# Patient Record
Sex: Male | Born: 1945
Health system: Southern US, Community
[De-identification: ages and names within clinical notes are randomized; demographics above are authoritative.]

## PROBLEM LIST (undated history)

## (undated) DIAGNOSIS — K760 Fatty (change of) liver, not elsewhere classified: Secondary | ICD-10-CM

## (undated) DIAGNOSIS — K2270 Barrett's esophagus without dysplasia: Secondary | ICD-10-CM

## (undated) DIAGNOSIS — R740 Nonspecific elevation of levels of transaminase and lactic acid dehydrogenase [LDH]: Secondary | ICD-10-CM

## (undated) DIAGNOSIS — I10 Essential (primary) hypertension: Secondary | ICD-10-CM

## (undated) DIAGNOSIS — K635 Polyp of colon: Secondary | ICD-10-CM

## (undated) DIAGNOSIS — M5126 Other intervertebral disc displacement, lumbar region: Secondary | ICD-10-CM

## (undated) DIAGNOSIS — K219 Gastro-esophageal reflux disease without esophagitis: Secondary | ICD-10-CM

## (undated) DIAGNOSIS — R7401 Elevation of levels of liver transaminase levels: Secondary | ICD-10-CM

## (undated) HISTORY — PX: SMALL INTESTINE SURGERY: SHX150

## (undated) HISTORY — DX: Elevation of levels of liver transaminase levels: R74.01

## (undated) HISTORY — DX: Gastro-esophageal reflux disease without esophagitis: K21.9

## (undated) HISTORY — DX: Barrett's esophagus without dysplasia: K22.70

## (undated) HISTORY — DX: Essential (primary) hypertension: I10

## (undated) HISTORY — DX: Fatty (change of) liver, not elsewhere classified: K76.0

## (undated) HISTORY — DX: Other intervertebral disc displacement, lumbar region: M51.26

## (undated) HISTORY — PX: TONSILLECTOMY: SUR1361

## (undated) HISTORY — DX: Nonspecific elevation of levels of transaminase and lactic acid dehydrogenase (ldh): R74.0

## (undated) HISTORY — DX: Polyp of colon: K63.5

---

## 2002-02-19 DIAGNOSIS — K2270 Barrett's esophagus without dysplasia: Secondary | ICD-10-CM

## 2002-02-19 DIAGNOSIS — K635 Polyp of colon: Secondary | ICD-10-CM

## 2002-02-19 HISTORY — DX: Barrett's esophagus without dysplasia: K22.70

## 2002-02-19 HISTORY — DX: Polyp of colon: K63.5

## 2005-02-19 DIAGNOSIS — M5126 Other intervertebral disc displacement, lumbar region: Secondary | ICD-10-CM

## 2005-02-19 HISTORY — DX: Other intervertebral disc displacement, lumbar region: M51.26

## 2008-11-08 ENCOUNTER — Ambulatory Visit: Payer: Self-pay | Admitting: Internal Medicine

## 2008-12-13 ENCOUNTER — Ambulatory Visit: Payer: Self-pay | Admitting: Gastroenterology

## 2009-07-04 ENCOUNTER — Ambulatory Visit: Payer: Self-pay | Admitting: Gastroenterology

## 2009-10-04 ENCOUNTER — Other Ambulatory Visit: Payer: Self-pay | Admitting: Gastroenterology

## 2011-01-22 ENCOUNTER — Encounter: Payer: Self-pay | Admitting: Internal Medicine

## 2011-01-22 ENCOUNTER — Ambulatory Visit (INDEPENDENT_AMBULATORY_CARE_PROVIDER_SITE_OTHER): Payer: Medicare Other | Admitting: Internal Medicine

## 2011-01-22 DIAGNOSIS — K219 Gastro-esophageal reflux disease without esophagitis: Secondary | ICD-10-CM

## 2011-01-22 DIAGNOSIS — E669 Obesity, unspecified: Secondary | ICD-10-CM

## 2011-01-22 DIAGNOSIS — E1169 Type 2 diabetes mellitus with other specified complication: Secondary | ICD-10-CM

## 2011-01-22 DIAGNOSIS — E119 Type 2 diabetes mellitus without complications: Secondary | ICD-10-CM

## 2011-01-22 DIAGNOSIS — K760 Fatty (change of) liver, not elsewhere classified: Secondary | ICD-10-CM

## 2011-01-22 DIAGNOSIS — K7689 Other specified diseases of liver: Secondary | ICD-10-CM

## 2011-01-22 DIAGNOSIS — Z23 Encounter for immunization: Secondary | ICD-10-CM

## 2011-01-22 NOTE — Patient Instructions (Signed)
Don't forget to get your annual eye exam soon.  Fasting labs were done today  Your recieved a TdaP vaccine today

## 2011-01-23 ENCOUNTER — Encounter: Payer: Self-pay | Admitting: Internal Medicine

## 2011-01-23 DIAGNOSIS — K219 Gastro-esophageal reflux disease without esophagitis: Secondary | ICD-10-CM | POA: Insufficient documentation

## 2011-01-23 DIAGNOSIS — K760 Fatty (change of) liver, not elsewhere classified: Secondary | ICD-10-CM | POA: Insufficient documentation

## 2011-01-23 DIAGNOSIS — K635 Polyp of colon: Secondary | ICD-10-CM | POA: Insufficient documentation

## 2011-01-23 LAB — COMPREHENSIVE METABOLIC PANEL
ALT: 65 U/L — ABNORMAL HIGH (ref 0–53)
AST: 47 U/L — ABNORMAL HIGH (ref 0–37)
Albumin: 4.3 g/dL (ref 3.5–5.2)
CO2: 20 mEq/L (ref 19–32)
Calcium: 9 mg/dL (ref 8.4–10.5)
Chloride: 106 mEq/L (ref 96–112)
Creatinine, Ser: 1.2 mg/dL (ref 0.4–1.5)
GFR: 63.77 mL/min (ref >60.00)
Potassium: 4.2 mEq/L (ref 3.5–5.1)
Total Protein: 6.9 g/dL (ref 6.0–8.3)

## 2011-01-23 LAB — MICROALBUMIN / CREATININE URINE RATIO: Creatinine,U: 84.4 mg/dL

## 2011-01-23 LAB — LIPID PANEL
LDL Cholesterol: 106 mg/dL — ABNORMAL HIGH (ref 0–99)
Total CHOL/HDL Ratio: 5
Triglycerides: 172 mg/dL — ABNORMAL HIGH (ref 0.0–149.0)

## 2011-01-23 NOTE — Assessment & Plan Note (Signed)
With history of Barrett's esophagus.  Continue PPI indefinitely.  Follow up with Dr. Niel Hummer.

## 2011-01-23 NOTE — Assessment & Plan Note (Signed)
He is due for repeat hepatic panel and lipids.

## 2011-01-23 NOTE — Progress Notes (Signed)
  Subjective:    Patient ID: Chad Gill, male    DOB: 03-05-1945, 65 y.o.   MRN: [id number]  HPI  65 yo male with history of fatty liver by ultrasound, prior carcinoid tumor found on endoscopy by Dr. Niel Hummer, then not confirmed by [facility] referral, presents for general followup on chronic medical issues.  Feels good, no joint pain, nausea, change in bowel habits, or insomnia.  Past Medical History  Diagnosis Date  . Barrett's esophagus 2004    last EGD 2007, no metaplasia, due in 2009  . Transaminitis   . Lumbar disc herniation 2007    L5  . Diabetes mellitus   . GERD (gastroesophageal reflux disease)   . Hyperplastic colonic polyp 2004  . Fatty liver disease, nonalcoholic    No current outpatient prescriptions on file prior to visit.     Review of Systems  Constitutional: Negative for fever, chills, diaphoresis, activity change, appetite change, fatigue and unexpected weight change.  HENT: Negative for hearing loss, ear pain, nosebleeds, congestion, sore throat, facial swelling, rhinorrhea, sneezing, drooling, mouth sores, trouble swallowing, neck pain, neck stiffness, dental problem, voice change, postnasal drip, sinus pressure, tinnitus and ear discharge.   Eyes: Negative for photophobia, pain, discharge, redness, itching and visual disturbance.  Respiratory: Negative for apnea, cough, choking, chest tightness, shortness of breath, wheezing and stridor.   Cardiovascular: Negative for chest pain, palpitations and leg swelling.  Gastrointestinal: Negative for nausea, vomiting, abdominal pain, diarrhea, constipation, blood in stool, abdominal distention, anal bleeding and rectal pain.  Genitourinary: Negative for dysuria, urgency, frequency, hematuria, flank pain, decreased urine volume, scrotal swelling, difficulty urinating and testicular pain.  Musculoskeletal: Negative for myalgias, back pain, joint swelling, arthralgias and gait problem.  Skin: Negative for color change, rash  and wound.  Neurological: Negative for dizziness, tremors, seizures, syncope, speech difficulty, weakness, light-headedness, numbness and headaches.  Psychiatric/Behavioral: Negative for suicidal ideas, hallucinations, behavioral problems, confusion, sleep disturbance, dysphoric mood, decreased concentration and agitation. The patient is not nervous/anxious.        Objective:   Physical Exam  Constitutional: He is oriented to person, place, and time.  HENT:  Head: Normocephalic and atraumatic.  Mouth/Throat: Oropharynx is clear and moist.  Eyes: Conjunctivae and EOM are normal.  Neck: Normal range of motion. Neck supple. No JVD present. No thyromegaly present.  Cardiovascular: Normal rate, regular rhythm and normal heart sounds.   Pulmonary/Chest: Effort normal and breath sounds normal. He has no wheezes. He has no rales.  Abdominal: Soft. Bowel sounds are normal. He exhibits no mass. There is no tenderness. There is no rebound.  Musculoskeletal: Normal range of motion. He exhibits no edema.  Neurological: He is alert and oriented to person, place, and time.  Skin: Skin is warm and dry.  Psychiatric: He has a normal mood and affect.          Assessment & Plan:

## 2011-01-25 MED ORDER — PRAVASTATIN SODIUM 20 MG PO TABS: 20.0000 mg | ORAL_TABLET | Freq: Every evening | ORAL | Status: DC

## 2011-01-25 NOTE — Progress Notes (Signed)
Addended by: Darletta Moll A on: 01/25/2011 02:10 PM   Modules accepted: Orders

## 2011-01-29 NOTE — Progress Notes (Signed)
Addended by: Duncan Dull on: 01/29/2011 05:46 PM   Modules accepted: Orders

## 2011-02-26 ENCOUNTER — Other Ambulatory Visit: Payer: Medicare Other

## 2011-03-14 DIAGNOSIS — E669 Obesity, unspecified: Secondary | ICD-10-CM | POA: Diagnosis not present

## 2011-03-14 DIAGNOSIS — R945 Abnormal results of liver function studies: Secondary | ICD-10-CM | POA: Diagnosis not present

## 2011-03-14 DIAGNOSIS — K7689 Other specified diseases of liver: Secondary | ICD-10-CM | POA: Diagnosis not present

## 2011-03-14 DIAGNOSIS — E785 Hyperlipidemia, unspecified: Secondary | ICD-10-CM | POA: Diagnosis not present

## 2011-03-27 ENCOUNTER — Encounter: Payer: Self-pay | Admitting: Internal Medicine

## 2011-06-18 ENCOUNTER — Telehealth: Payer: Self-pay | Admitting: Internal Medicine

## 2011-06-18 MED ORDER — BENZONATATE 200 MG PO CAPS: 200.0000 mg | ORAL_CAPSULE | Freq: Three times a day (TID) | ORAL | Status: AC | PRN

## 2011-06-18 NOTE — Telephone Encounter (Signed)
.  Tell him to try generic OTC benadryl 25 mg every 8 hours for the drainage,  Sudafed PE  10 to 20 mg every 6 hours for the congestion,  and flushes sinuses twice daily with Simply Saline.   Can call in tessalon 200 mg one tablet every 8 hours prn  Cough  #60 no refills ,  And offer appt for Friday if no better.

## 2011-06-18 NOTE — Telephone Encounter (Signed)
I called patients wife, she stated he has had a productive cough since last Wednesday and he has tried a chest congestion PE tablet OTC and delsym with no relief.  His other only symptom is a sore throat.   She wanted to know if there is anything you can suggest he take or something be called in.  Please advise.

## 2011-06-18 NOTE — Telephone Encounter (Signed)
Patients wife notified, Rx called in.

## 2011-06-18 NOTE — Telephone Encounter (Signed)
Patient has a cough has taken different medication and nothing has help could you recommend something or call something into the pharmacy,

## 2011-06-20 ENCOUNTER — Encounter: Payer: Self-pay | Admitting: Internal Medicine

## 2011-06-20 ENCOUNTER — Ambulatory Visit (INDEPENDENT_AMBULATORY_CARE_PROVIDER_SITE_OTHER): Payer: Medicare Other | Admitting: Internal Medicine

## 2011-06-20 VITALS — BP 134/70 | HR 78 | Temp 98.5°F | Resp 18 | Wt 244.2 lb

## 2011-06-20 DIAGNOSIS — J329 Chronic sinusitis, unspecified: Secondary | ICD-10-CM | POA: Diagnosis not present

## 2011-06-20 MED ORDER — AMOXICILLIN-POT CLAVULANATE 875-125 MG PO TABS: 1.0000 | ORAL_TABLET | Freq: Two times a day (BID) | ORAL | Status: AC

## 2011-06-20 MED ORDER — HYDROCOD POLST-CHLORPHEN POLST 10-8 MG/5ML PO LQCR: 5.0000 mL | Freq: Two times a day (BID) | ORAL | Status: DC | PRN

## 2011-06-20 NOTE — Progress Notes (Signed)
Patient ID: Chad Gill, male   DOB: 1945/05/26, 66 y.o.   MRN: [id number]   Patient Active Problem List  Diagnoses  . GERD (gastroesophageal reflux disease)  . Hyperplastic colonic polyp  . Fatty liver disease, nonalcoholic  . Sinusitis    Subjective:  CC:   Chief Complaint  Patient presents with  . Cough    HPI:   Chad Gill a 66 y.o. male who presents with a 1 week history of sore throat accompanied by bilateral ear pain and nonproductive cough.  He has a persistent sinus congestion and is now lost his voice.  He denies fevers but has had malaise and some mild myalgias. No nausea vomiting or diarrhea. No sick contacts.     Past Medical History  Diagnosis Date  . Barrett's esophagus 2004    last EGD 2007, no metaplasia, due in 2009  . Transaminitis   . Lumbar disc herniation 2007    L5  . Diabetes mellitus   . GERD (gastroesophageal reflux disease)   . Hyperplastic colonic polyp 2004  . Fatty liver disease, nonalcoholic     Past Surgical History  Procedure Date  . Tonsillectomy age 47  . Small intestine surgery     carcinoid tumore, diagnosis disputed         The following portions of the patient's history were reviewed and updated as appropriate: Allergies, current medications, and problem list.    Review of Systems:   12 Pt  review of systems was negative except those addressed in the HPI,     History   Social History  . Marital Status: Married    Spouse Name: N/A    Number of Children: N/A  . Years of Education: N/A   Occupational History  . Not on file.   Social History Main Topics  . Smoking status: Former Smoker    Types: Cigarettes    Quit date: 01/21/1986  . Smokeless tobacco: Never Used  . Alcohol Use: Yes  . Drug Use: No  . Sexually Active: Not on file   Other Topics Concern  . Not on file   Social History Narrative  . No narrative on file    Objective:  BP 134/70  Pulse 78  Temp(Src) 98.5 F (36.9 C)  (Oral)  Resp 18  Wt 244 lb 4 oz (110.791 kg)  SpO2 95%  General appearance: alert, cooperative and appears stated age Ears: normal TM's and external ear canals both ears Throat: lips, mucosa, and tongue normal; teeth and gums normal Neck: no adenopathy, no carotid bruit, supple, symmetrical, trachea midline and thyroid not enlarged, symmetric, no tenderness/mass/nodules Back: symmetric, no curvature. ROM normal. No CVA tenderness. Lungs: clear to auscultation bilaterally Heart: regular rate and rhythm, S1, S2 normal, no murmur, click, rub or gallop Abdomen: soft, non-tender; bowel sounds normal; no masses,  no organomegaly Pulses: 2+ and symmetric Skin: Skin color, texture, turgor normal. No rashes or lesions Lymph nodes: Cervical, supraclavicular, and axillary nodes normal.  Assessment and Plan:  Sinusitis Given chronicity of symptoms, development of facial pain and exam consistent with bacterial URI,  Will treat with empiric antibiotics, decongestants, and saline lavage.     Updated Medication List Outpatient Encounter Prescriptions as of 06/20/2011  Medication Sig Dispense Refill  . benzonatate (TESSALON) 200 MG capsule Take 1 capsule (200 mg total) by mouth every 8 (eight) hours as needed for cough.  60 capsule  0  . nizatidine (AXID) 300 MG capsule Take 300 mg by mouth at  bedtime.        Marland Kitchen omeprazole (PRILOSEC) 20 MG capsule Take 20 mg by mouth 2 (two) times daily.        Marland Kitchen amoxicillin-clavulanate (AUGMENTIN) 875-125 MG per tablet Take 1 tablet by mouth 2 (two) times daily.  14 tablet  0  . chlorpheniramine-HYDROcodone (TUSSIONEX PENNKINETIC ER) 10-8 MG/5ML LQCR Take 5 mLs by mouth every 12 (twelve) hours as needed.  220 mL  0  . DISCONTD: pravastatin (PRAVACHOL) 20 MG tablet Take 1 tablet (20 mg total) by mouth every evening.  90 tablet  2     No orders of the defined types were placed in this encounter.    No Follow-up on file.

## 2011-06-20 NOTE — Patient Instructions (Signed)
You have a sinus/ear infection   .  I am prescribing an antibiotic  to manage the infectin and the inflammation in your ear/sinuses.   I also advise use of the following OTC meds to help with your other symptoms.   Take generic OTC benadryl 25 mg every 8 hours for the drainage,  Sudafed PE  10 to 30 mg every 8 hours for the congestion, you may substitute Afrin nasal spray for the nighttime dose of sudafed PE  If needed to prevent insomnia.  flushes your sinuses twice daily with Simply Saline (do over the sink because if you do it right you will spit out globs of mucus)  Use benzonatate capsules for daytine cough and Tussionex for nighttime cough  Gargle with salt water as needed for sore throat.

## 2011-06-21 ENCOUNTER — Encounter: Payer: Self-pay | Admitting: Internal Medicine

## 2011-06-21 NOTE — Assessment & Plan Note (Signed)
Given chronicity of symptoms, development of facial pain and exam consistent with bacterial URI,  Will treat with empiric antibiotics, decongestants, and saline lavage.   

## 2011-07-18 DIAGNOSIS — E669 Obesity, unspecified: Secondary | ICD-10-CM | POA: Diagnosis not present

## 2011-07-18 DIAGNOSIS — K7689 Other specified diseases of liver: Secondary | ICD-10-CM | POA: Diagnosis not present

## 2011-07-18 DIAGNOSIS — R945 Abnormal results of liver function studies: Secondary | ICD-10-CM | POA: Diagnosis not present

## 2011-07-23 ENCOUNTER — Encounter: Payer: Self-pay | Admitting: Internal Medicine

## 2011-07-23 ENCOUNTER — Ambulatory Visit: Payer: Medicare Other | Admitting: Internal Medicine

## 2011-07-23 VITALS — BP 140/84 | HR 77 | Temp 98.3°F | Resp 18 | Wt 240.2 lb

## 2011-07-23 DIAGNOSIS — E669 Obesity, unspecified: Secondary | ICD-10-CM

## 2011-07-23 DIAGNOSIS — K760 Fatty (change of) liver, not elsewhere classified: Secondary | ICD-10-CM

## 2011-07-23 NOTE — Patient Instructions (Addendum)
Bring your bp cuff with you to next appt with me so we can double check its accuracy.   Good bp is < 140/80  Consider the Low Glycemic Index Diet and 6 smaller meals daily .  This boosts your metabolism and regulates your sugars:   7 AM Low carbohydrate Protein  Shakes (EAS Carb Control  Or Atkins ,  Available everywhere,   In  cases at BJs )  2.5 carbs  (Add or substitute a toasted sandwhich thin w/ peanut butter)  10 AM: Protein bar by Atkins (snack size,  Chocolate lover's variety at  BJ's)    Lunch: sandwich on pita bread or flatbread (Joseph's makes a pita bread and a flat bread , available at [employer] and [employer]J's; Toufayah makes a low carb flatbread available at [employer] and [employer]) Mission makes a low carb whole wheat tortilla available at [employer] most grocery stores   3 PM:  Mid day :  Another protein bar,  Or a  cheese stick, 1/4 cup of almonds, walnuts, pistachios, pecans, peanuts,  Macadamia nuts  6 PM  Dinner:  "mean and green:"  Meat/chicken/fish, salad, and green veggie : use ranch, vinagrette,  Blue cheese, etc  9 PM snack : Breyer's low carb fudgsicle or  ice cream bar (Carb Smart), or  Weight Watcher's ice cream bar , or another protein shake

## 2011-07-23 NOTE — Progress Notes (Signed)
Patient ID: Chad Gill, male   DOB: 07-16-1945, 66 y.o.   MRN: [id number] 6 month follow up on chronic diseases including fatty liver, obesity and hyperlipidemia Recent follow uo wth Carla brady at [facility]  , with repeat ultrasound planned next month.  Feels good.  Not exercising regularly yet  But has vowed to start a video led program with his wife Eber Jones.  Marland Kitchen

## 2011-07-24 DIAGNOSIS — E669 Obesity, unspecified: Secondary | ICD-10-CM | POA: Insufficient documentation

## 2011-07-24 NOTE — Assessment & Plan Note (Signed)
Has been referred for this and other GI isses to Dr. Albin Felling brady at [facility].  Ultrasound and labs to beo done by her office i the next several weeks.  Continue pravastatin for cholestero lowering.

## 2011-07-24 NOTE — Assessment & Plan Note (Signed)
I have addressed  BMI and recommended a low glycemic index diet utilizing smaller more frequent meals to increase metabolism.  I have also recommended that patient start exercising with a goal of 30 minutes of aerobic exercise a minimum of 5 days per week.  

## 2011-08-06 ENCOUNTER — Other Ambulatory Visit: Payer: Self-pay | Admitting: Internal Medicine

## 2011-08-06 MED ORDER — NIZATIDINE 300 MG PO CAPS: 300.0000 mg | ORAL_CAPSULE | Freq: Every day | ORAL | Status: DC

## 2011-08-15 DIAGNOSIS — K7689 Other specified diseases of liver: Secondary | ICD-10-CM | POA: Diagnosis not present

## 2011-11-02 ENCOUNTER — Other Ambulatory Visit: Payer: Self-pay | Admitting: Internal Medicine

## 2011-11-02 ENCOUNTER — Other Ambulatory Visit: Payer: Self-pay | Admitting: *Deleted

## 2011-11-02 DIAGNOSIS — E785 Hyperlipidemia, unspecified: Secondary | ICD-10-CM

## 2011-11-02 MED ORDER — OMEPRAZOLE 20 MG PO CPDR: 20.0000 mg | DELAYED_RELEASE_CAPSULE | Freq: Two times a day (BID) | ORAL | Status: DC

## 2011-11-02 NOTE — Telephone Encounter (Signed)
Received e-script request for Pravastatin, this is not on med list.  Is patient suppose to be on this medication?  Please advise.

## 2011-11-02 NOTE — Telephone Encounter (Signed)
Yes it was called in in December by staff but the chart was not updated. I will update chart now.

## 2011-11-21 DIAGNOSIS — K7689 Other specified diseases of liver: Secondary | ICD-10-CM | POA: Diagnosis not present

## 2012-01-18 ENCOUNTER — Other Ambulatory Visit: Payer: Self-pay | Admitting: Internal Medicine

## 2012-01-30 ENCOUNTER — Ambulatory Visit (INDEPENDENT_AMBULATORY_CARE_PROVIDER_SITE_OTHER): Payer: Medicare Other | Admitting: Internal Medicine

## 2012-01-30 ENCOUNTER — Encounter: Payer: Self-pay | Admitting: Internal Medicine

## 2012-01-30 VITALS — BP 160/90 | HR 74 | Temp 98.3°F | Resp 12 | Ht 69.0 in | Wt 239.8 lb

## 2012-01-30 DIAGNOSIS — Z23 Encounter for immunization: Secondary | ICD-10-CM

## 2012-01-30 DIAGNOSIS — Z1331 Encounter for screening for depression: Secondary | ICD-10-CM | POA: Diagnosis not present

## 2012-01-30 DIAGNOSIS — K760 Fatty (change of) liver, not elsewhere classified: Secondary | ICD-10-CM

## 2012-01-30 DIAGNOSIS — E669 Obesity, unspecified: Secondary | ICD-10-CM

## 2012-01-30 DIAGNOSIS — I10 Essential (primary) hypertension: Secondary | ICD-10-CM | POA: Insufficient documentation

## 2012-01-30 DIAGNOSIS — E119 Type 2 diabetes mellitus without complications: Secondary | ICD-10-CM

## 2012-01-30 DIAGNOSIS — K7689 Other specified diseases of liver: Secondary | ICD-10-CM

## 2012-01-30 LAB — MICROALBUMIN / CREATININE URINE RATIO
Creatinine,U: 200.5 mg/dL
Microalb Creat Ratio: 0.2 mg/g (ref 0.0–30.0)
Microalb, Ur: 0.4 mg/dL (ref 0.0–1.9)

## 2012-01-30 LAB — LIPID PANEL: Total CHOL/HDL Ratio: 5

## 2012-01-30 LAB — HEMOGLOBIN A1C: Hgb A1c MFr Bld: 5.5 % (ref 4.6–6.5)

## 2012-01-30 MED ORDER — MELOXICAM 15 MG PO TABS: 15.0000 mg | ORAL_TABLET | Freq: Every day | ORAL | Status: DC

## 2012-01-30 NOTE — Progress Notes (Addendum)
Patient ID: Chad Gill, male   DOB: November 11, 1945, 66 y.o.   MRN: [id number]  Patient Active Problem List  Diagnosis  . GERD (gastroesophageal reflux disease)  . Hyperplastic colonic polyp  . Fatty liver disease, nonalcoholic  . Sinusitis  . Obesity (BMI 30-39.9)  . Hypertension    Subjective:  CC:   Chief Complaint  Patient presents with  . Follow-up    HPI:   Chad Gill a 66 y.o. male who presents for six-month Follow up on fatty liver, obesity, and hypertension. He is not exercising vigorously but is walking daily  and plans to increase his physical activity after the holidays.  Has lost 5 lbs thus far.   2) Left shoulder pain,  Chronic aggravated by abduction, with no prior trauma.2) elevated bp.  Repeat bp by me 160/90  Home machine measured it at  147/98. Same arm (right)   Past Medical History  Diagnosis Date  . Barrett's esophagus 2004    last EGD 2007, no metaplasia, due in 2009  . Transaminitis   . Lumbar disc herniation 2007    L5  . Diabetes mellitus   . GERD (gastroesophageal reflux disease)   . Hyperplastic colonic polyp 2004  . Fatty liver disease, nonalcoholic     Past Surgical History  Procedure Date  . Tonsillectomy age 48  . Small intestine surgery     carcinoid tumore, diagnosis disputed         The following portions of the patient's history were reviewed and updated as appropriate: Allergies, current medications, and problem list.    Review of Systems:   12 Pt  review of systems was negative except those addressed in the HPI,     History   Social History  . Marital Status: Married    Spouse Name: N/A    Number of Children: N/A  . Years of Education: N/A   Occupational History  . Not on file.   Social History Main Topics  . Smoking status: Former Smoker    Types: Cigarettes    Quit date: 01/21/1986  . Smokeless tobacco: Never Used  . Alcohol Use: Yes  . Drug Use: No  . Sexually Active: Not on file   Other  Topics Concern  . Not on file   Social History Narrative  . No narrative on file    Objective:  BP 160/90  Pulse 74  Temp 98.3 F (36.8 C) (Oral)  Resp 12  Ht 5\' 9"  (1.753 m)  Wt 239 lb 12 oz (108.75 kg)  BMI 35.40 kg/m2  SpO2 96%  General appearance: alert, cooperative and appears stated age Ears: normal TM's and external ear canals both ears Throat: lips, mucosa, and tongue normal; teeth and gums normal Neck: no adenopathy, no carotid bruit, supple, symmetrical, trachea midline and thyroid not enlarged, symmetric, no tenderness/mass/nodules Back: symmetric, no curvature. ROM normal. No CVA tenderness. Lungs: clear to auscultation bilaterally Heart: regular rate and rhythm, S1, S2 normal, no murmur, click, rub or gallop Abdomen: soft, non-tender; bowel sounds normal; no masses,  no organomegaly Pulses: 2+ and symmetric Skin: Skin color, texture, turgor normal. No rashes or lesions Lymph nodes: Cervical, supraclavicular, and axillary nodes normal.  Assessment and Plan:  Fatty liver disease, nonalcoholic Diagnosed and followed by Dr.Diehk at [facility]. Liver enzymes have been slightly elevated but stable. He is controlling his his cholesterol with pravastatin fish oil and vitamin E. He has been addressing his obesity with a 5 pound weight loss thus far.  Encouragement provided.  Hypertension New diagnosis .  Review of past vital signs of showed 140/82 been his norm. Microalbumin to creatinine ratio to was checked today along with an A1c. Both of  these were normal. He has been asked to check his blood pressure pressure several times over the next several weeks and report numbers to me along with pulse so that I can decide what medication to start treating them with  Obesity (BMI 30-39.9) I have addressed  BMI and recommended a low glycemic index diet utilizing smaller more frequent meals to increase metabolism.  I have also recommended that patient start exercising with a  goal of 30 minutes of aerobic exercise a minimum of 5 days per week.   Updated Medication List Outpatient Encounter Prescriptions as of 01/30/2012  Medication Sig Dispense Refill  . fish oil-omega-3 fatty acids 1000 MG capsule Take 2 g by mouth daily.      . Garlic 1000 MG CAPS Take by mouth.      . nizatidine (AXID) 300 MG capsule Take 1 capsule (300 mg total) by mouth at bedtime.  90 capsule  3  . omeprazole (PRILOSEC) 20 MG capsule TAKE ONE CAPSULE BY MOUTH TWICE A DAY  90 capsule  0  . vitamin E 400 UNIT capsule Take 800 Units by mouth daily.      . meloxicam (MOBIC) 15 MG tablet Take 1 tablet (15 mg total) by mouth daily.  30 tablet  2  . pravastatin (PRAVACHOL) 20 MG tablet TAKE 1 TABLET BY MOUTH EVERY EVENING  90 tablet  2     Orders Placed This Encounter  Procedures  . Pneumococcal polysaccharide vaccine 23-valent greater than or equal to 2yo subcutaneous/IM  . Lipid panel  . Microalbumin / creatinine urine ratio  . Hemoglobin A1c    No Follow-up on file.

## 2012-01-30 NOTE — Patient Instructions (Addendum)
Try once daily meloxicam ,   For your shoulder.  Ok to combine with occasional tylenol (max 2  Tylenol daily) but do not combine with advil.,  Alleve,  Motrin etc.  Please check bp a few times over the next 3 weeks.  Remember that your machine is about 10 pts lower than mine on the sysolic end ,  And 10 pt shigher on the diastolic end .      Record the raw numbers, not the adjusted normals and submit in a few weeks.  You received the pneumonia vaccine today    This is  my version of a  "Low GI"  Diet:  All of the foods can be found at grocery stores and in bulk at [employer].  The Atkins protein bars and shakes are available in more varieties at Target, WalMart and [employer]'s Foods.     7 AM Breakfast:  Low carbohydrate Protein  Shakes (I recommend the EAS AdvantEdge "Carb Control" shakes  Or the low carb shakes by Atkins.   Both are available everywhere:  In  cases at BJs  Or in 4 packs at grocery stores and pharmacies  2.5 carbs  (Alternative is  a toasted Arnold's Sandwhich Thin w/ peanut butter, a "Bagel Thin" with cream cheese and salmon) or  a scrambled egg burrito made with a low carb tortilla .  Avoid cereal and bananas, oatmeal too unless you are cooking the old fashioned kind that takes 30-40 minutes to prepare.  the rest is overly processed, has minimal fiber, and is loaded with carbohydrates!   10 AM: Protein bar by Atkins (the snack size, under 200 cal).  There are many varieties , available widely again or in bulk in limited varieties at BJs)  Other so called "protein bars" tend to be loaded with carbohydrates.  Remember, in food advertising, the word "energy" is synonymous for " carbohydrate."  Lunch: sandwich of [place], (or any lunchmeat, grilled meat or canned tuna), fresh avocado, mayonnaise  and cheese on a lower carbohydrate pita bread, flatbread, or tortilla . Ok to use regular mayonnaise. The bread is the only source or carbohydrate that can be decreased (Joseph's makes a pita bread  and a flat bread that are 50 cal and 4 net carbs ; Toufayan makes a low carb flatbread that's 100 cal and 9 net carbs  and  Mission makes a low carb whole wheat tortilla  That is 210 cal and 6 net carbs)  3 PM:  Mid day :  Another protein bar,  Or a  cheese stick (100 cal, 0 carbs),  Or 1 ounce of  almonds, walnuts, pistachios, pecans, peanuts,  Macadamia nuts. Or a Dannon light n Fit greek yogurt, 80 cal 8 net carbs . Avoid "granola"; the dried cranberries and raisins are loaded with carbohydrates. Mixed nuts ok if no raisins or cranberries or dried fruit.   6 PM  Dinner:  "mean and green:"  Meat/chicken/fish or a high protein legume; , with a green salad, and a low GI  Veggie (broccoli, cauliflower, green beans, spinach, brussel sprouts. Lima beans) : Avoid "Low fat dressings, as well as Reyne Dumas and [address]! They are loaded with sugar! Instead use ranch, vinagrette,  Blue cheese, etc.  There is a low carb pasta by Dreamfield's available at [facility] that is acceptable and tastes great. Try Micheal Angelo's chicken piccata,  Eggplant and chicken parmigiano that are all low carb.   The chicken dish is 0 carbs,  and can be found in frozen section at BJs and Lowe's. Also try [employer] "Carnitas" (pulled pork, no sauce,  0 carbs) and his pot roast.   both are in the refrigerated section at BJs   9 PM snack : Breyer's "low carb" fudgsicle or  ice cream bar (Carb Smart line), or  Weight Watcher's ice cream bar , or another "no sugar added" ice cream;a serving of fresh berries/cherries with whipped cream (Avoid bananas, pineapple, grapes  and watermelon on a regular basis because they are high in sugar)   Remember that snack Substitutions should be less than 15 to 20 carbs  Per serving. Remember to subtract fiber grams and sugar alcohols to get the "net carbs."

## 2012-02-02 ENCOUNTER — Encounter: Payer: Self-pay | Admitting: Internal Medicine

## 2012-02-02 NOTE — Assessment & Plan Note (Signed)
I have addressed  BMI and recommended a low glycemic index diet utilizing smaller more frequent meals to increase metabolism.  I have also recommended that patient start exercising with a goal of 30 minutes of aerobic exercise a minimum of 5 days per week.  

## 2012-02-02 NOTE — Assessment & Plan Note (Signed)
Diagnosed and followed by Dr.Diehk at [facility]. Liver enzymes have been slightly elevated but stable. He is controlling his his cholesterol with pravastatin fish oil and vitamin E. He has been addressing his obesity with a 5 pound weight loss thus far. Encouragement provided.

## 2012-02-02 NOTE — Assessment & Plan Note (Addendum)
New diagnosis .  Review of past vital signs of showed 140/82 been his norm. Microalbumin to creatinine ratio to was checked today along with an A1c. Both of  these were normal. He has been asked to check his blood pressure pressure several times over the next several weeks and report numbers to me along with pulse so that I can decide what medication to start treating them with

## 2012-04-12 ENCOUNTER — Other Ambulatory Visit: Payer: Self-pay | Admitting: Internal Medicine

## 2012-05-21 DIAGNOSIS — K7689 Other specified diseases of liver: Secondary | ICD-10-CM | POA: Diagnosis not present

## 2012-05-21 DIAGNOSIS — E669 Obesity, unspecified: Secondary | ICD-10-CM | POA: Diagnosis not present

## 2012-05-28 ENCOUNTER — Ambulatory Visit: Payer: Medicare Other | Admitting: Internal Medicine

## 2012-06-03 LAB — HM DIABETES EYE EXAM

## 2012-06-16 ENCOUNTER — Ambulatory Visit (INDEPENDENT_AMBULATORY_CARE_PROVIDER_SITE_OTHER)
Admission: RE | Admit: 2012-06-16 | Discharge: 2012-06-16 | Disposition: A | Discharge status: Home / Self Care | Payer: Medicare Other | Source: Ambulatory Visit | Attending: Internal Medicine | Admitting: Internal Medicine

## 2012-06-16 ENCOUNTER — Encounter: Payer: Self-pay | Admitting: Internal Medicine

## 2012-06-16 ENCOUNTER — Ambulatory Visit (INDEPENDENT_AMBULATORY_CARE_PROVIDER_SITE_OTHER): Payer: Medicare Other | Admitting: Internal Medicine

## 2012-06-16 VITALS — BP 130/78 | HR 82 | Temp 98.4°F | Ht 69.0 in | Wt 241.5 lb

## 2012-06-16 DIAGNOSIS — R062 Wheezing: Secondary | ICD-10-CM | POA: Diagnosis not present

## 2012-06-16 DIAGNOSIS — J449 Chronic obstructive pulmonary disease, unspecified: Secondary | ICD-10-CM | POA: Diagnosis not present

## 2012-06-16 DIAGNOSIS — I10 Essential (primary) hypertension: Secondary | ICD-10-CM | POA: Diagnosis not present

## 2012-06-16 DIAGNOSIS — R05 Cough: Secondary | ICD-10-CM

## 2012-06-16 MED ORDER — CEFDINIR 300 MG PO CAPS: 300.0000 mg | ORAL_CAPSULE | Freq: Two times a day (BID) | ORAL | Status: DC

## 2012-06-16 MED ORDER — ALBUTEROL SULFATE HFA 108 (90 BASE) MCG/ACT IN AERS: 2.0000 | INHALATION_SPRAY | Freq: Four times a day (QID) | RESPIRATORY_TRACT | Status: DC | PRN

## 2012-06-16 NOTE — Patient Instructions (Signed)
I am going to give you an antibiotic (omnicef) - take one capsule 2x/day.  Mucinex DM in the am and Robitussin DM in the evening.   Use the albuterol inhaler - 2 puffs 4x/day as needed.

## 2012-06-17 ENCOUNTER — Encounter: Payer: Self-pay | Admitting: Internal Medicine

## 2012-06-17 NOTE — Progress Notes (Signed)
  Subjective:    Patient ID: Chad Gill, male    DOB: [birth year], 67 y.o.   MRN: [id number]  HPI 67 year old male with past history of hypertension and GERD who comes in today with head congestion and increased cough.  States symptoms started last week.  No increased drainage.  Does report sore throat from the increased coughing.  No fever.  Increased chest congestion.  Increased cough.  No sob.  No chest tightness.  No nausea or vomiting.  No bowel change.  Previous tobacco use.  Quit 1987.     Past Medical History  Diagnosis Date  . Barrett's esophagus 2004    last EGD 2007, no metaplasia, due in 2009  . Transaminitis   . Lumbar disc herniation 2007    L5  . Diabetes mellitus   . GERD (gastroesophageal reflux disease)   . Hyperplastic colonic polyp 2004  . Fatty liver disease, nonalcoholic     Current Outpatient Prescriptions on File Prior to Visit  Medication Sig Dispense Refill  . fish oil-omega-3 fatty acids 1000 MG capsule Take 2 g by mouth daily.      . Garlic 1000 MG CAPS Take by mouth.      . nizatidine (AXID) 300 MG capsule Take 1 capsule (300 mg total) by mouth at bedtime.  90 capsule  3  . omeprazole (PRILOSEC) 20 MG capsule TAKE ONE CAPSULE BY MOUTH TWICE A DAY  90 capsule  3  . vitamin E 400 UNIT capsule Take 800 Units by mouth daily.      . meloxicam (MOBIC) 15 MG tablet Take 1 tablet (15 mg total) by mouth daily.  30 tablet  2  . pravastatin (PRAVACHOL) 20 MG tablet TAKE 1 TABLET BY MOUTH EVERY EVENING  90 tablet  2   No current facility-administered medications on file prior to visit.    Review of Systems Patient denies any headache, lightheadedness or dizziness. Does report head congestin.  No increased drainage.   Sore throat from coughing.  No chest pain, tightness or palpitations.  No increased shortness of breath.  Does report increased cough and chest congestion.  No nausea or vomiting.  No bowel change.        Objective:   Physical Exam Filed Vitals:    06/16/12 1420  BP: 130/78  Pulse: 82  Temp: 98.4 F (60.18 C)   67 year old male in no acute distress.   HEENT:  Nares- clear.  Oropharynx - without lesions.  No significant tenderness to palpation over the sinuses.  NECK:  Supple.  Nontender.   HEART:  Appears to be regular. LUNGS:  No crackles or wheezing audible.  Some increased congestion - left lung base.  Cleared some with coughing.          Assessment & Plan:  URI.  With increased chest congestion and cough.  Exam as outlined.  Given some increased congestion at the lung base - will check cxr.  Start omnicef 300mg  bid x 10 days.  Albuterol inhaler as directed.  Mucinex DM in the am and robitussin DM in the pm.  Rest.  Fluids. Explained to him if symptoms changed, worsened or do not resolve - he is to be reevaluated.

## 2012-06-17 NOTE — Assessment & Plan Note (Signed)
Blood pressure controlled. 

## 2012-06-19 ENCOUNTER — Other Ambulatory Visit: Payer: Self-pay | Admitting: Internal Medicine

## 2012-06-19 MED ORDER — HYDROCOD POLST-CHLORPHEN POLST 10-8 MG/5ML PO LQCR: 5.0000 mL | Freq: Two times a day (BID) | ORAL | Status: DC | PRN

## 2012-07-17 DIAGNOSIS — H2510 Age-related nuclear cataract, unspecified eye: Secondary | ICD-10-CM | POA: Diagnosis not present

## 2012-07-28 ENCOUNTER — Other Ambulatory Visit: Payer: Self-pay | Admitting: Internal Medicine

## 2012-08-25 ENCOUNTER — Ambulatory Visit (INDEPENDENT_AMBULATORY_CARE_PROVIDER_SITE_OTHER): Payer: Medicare Other | Admitting: Internal Medicine

## 2012-08-25 ENCOUNTER — Encounter: Payer: Self-pay | Admitting: Internal Medicine

## 2012-08-25 VITALS — BP 158/90 | HR 72 | Temp 98.4°F | Resp 14 | Wt 242.5 lb

## 2012-08-25 DIAGNOSIS — M25519 Pain in unspecified shoulder: Secondary | ICD-10-CM

## 2012-08-25 DIAGNOSIS — I10 Essential (primary) hypertension: Secondary | ICD-10-CM | POA: Diagnosis not present

## 2012-08-25 DIAGNOSIS — E669 Obesity, unspecified: Secondary | ICD-10-CM

## 2012-08-25 DIAGNOSIS — K219 Gastro-esophageal reflux disease without esophagitis: Secondary | ICD-10-CM

## 2012-08-25 DIAGNOSIS — E785 Hyperlipidemia, unspecified: Secondary | ICD-10-CM | POA: Diagnosis not present

## 2012-08-25 DIAGNOSIS — Z79899 Other long term (current) drug therapy: Secondary | ICD-10-CM | POA: Diagnosis not present

## 2012-08-25 DIAGNOSIS — M25512 Pain in left shoulder: Secondary | ICD-10-CM

## 2012-08-25 DIAGNOSIS — R739 Hyperglycemia, unspecified: Secondary | ICD-10-CM

## 2012-08-25 DIAGNOSIS — R7309 Other abnormal glucose: Secondary | ICD-10-CM

## 2012-08-25 LAB — COMPREHENSIVE METABOLIC PANEL
CO2: 28 mEq/L (ref 19–32)
Creatinine, Ser: 1.2 mg/dL (ref 0.4–1.5)
GFR: 64.69 mL/min (ref >60.00)
Glucose, Bld: 128 mg/dL — ABNORMAL HIGH (ref 70–99)
Total Bilirubin: 1.1 mg/dL (ref 0.3–1.2)

## 2012-08-25 LAB — LIPID PANEL
Cholesterol: 191 mg/dL (ref 0–200)
HDL: 37.2 mg/dL — ABNORMAL LOW (ref >39.00)
VLDL: 38.6 mg/dL (ref 0.0–40.0)

## 2012-08-25 MED ORDER — HYDROCHLOROTHIAZIDE 25 MG PO TABS: 25.0000 mg | ORAL_TABLET | Freq: Every day | ORAL | Status: DC

## 2012-08-25 NOTE — Progress Notes (Signed)
Patient ID: Chad Gill, male   DOB: 1945/08/16, 67 y.o.   MRN: [id number]  Patient Active Problem List   Diagnosis Date Noted  . Pain in joint, shoulder region 08/26/2012  . Hyperglycemia 08/26/2012  . Hypertension 01/30/2012  . Obesity (BMI 30-39.9) 07/24/2011  . GERD (gastroesophageal reflux disease)   . Hyperplastic colonic polyp   . Fatty liver disease, nonalcoholic     Subjective:  CC:   Chief Complaint  Patient presents with  . Follow-up    3 month    HPI:    Chad Gill a 67 y.o. male who presents for 6 month follow up on chronic conditions including new diagnosis of hypertension,  Fatty liver disease,  And obesity. He feels generally well, but has stopped taking meloxicam due to concern that it might cause a heart attack.  .  He has not been exercising regularly due to shoulder pain and family event.  He is not restricting his diet  He has not lost any signigicant amount of weight but is down 2 lbs compared to May 2013.    Past Medical History  Diagnosis Date  . Barrett's esophagus 2004    last EGD 2007, no metaplasia, due in 2009  . Transaminitis   . Lumbar disc herniation 2007    L5  . Diabetes mellitus   . GERD (gastroesophageal reflux disease)   . Hyperplastic colonic polyp 2004  . Fatty liver disease, nonalcoholic     Past Surgical History  Procedure Laterality Date  . Tonsillectomy  age 5  . Small intestine surgery      carcinoid tumore, diagnosis disputed       The following portions of the patient's history were reviewed and updated as appropriate: Allergies, current medications, and problem list.    Review of Systems:   12 Pt  review of systems was negative except those addressed in the HPI,     History   Social History  . Marital Status: Married    Spouse Name: N/A    Number of Children: N/A  . Years of Education: N/A   Occupational History  . Not on file.   Social History Main Topics  . Smoking status: Former  Smoker    Types: Cigarettes    Quit date: 01/21/1986  . Smokeless tobacco: Never Used  . Alcohol Use: Yes     Comment: ocassional beer  . Drug Use: No  . Sexually Active: Not on file   Other Topics Concern  . Not on file   Social History Narrative  . No narrative on file    Objective:  BP 158/90  Pulse 72  Temp(Src) 98.4 F (36.9 C) (Oral)  Resp 14  Wt 242 lb 8 oz (109.997 kg)  BMI 35.79 kg/m2  SpO2 98%  General appearance: alert, cooperative and appears stated age Ears: normal TM's and external ear canals both ears Throat: lips, mucosa, and tongue normal; teeth and gums normal Neck: no adenopathy, no carotid bruit, supple, symmetrical, trachea midline and thyroid not enlarged, symmetric, no tenderness/mass/nodules Back: symmetric, no curvature. ROM normal. No CVA tenderness. Lungs: clear to auscultation bilaterally Heart: regular rate and rhythm, S1, S2 normal, no murmur, click, rub or gallop Abdomen: soft, non-tender; bowel sounds normal; no masses,  no organomegaly Pulses: 2+ and symmetric Skin: Skin color, texture, turgor normal. No rashes or lesions Lymph nodes: Cervical, supraclavicular, and axillary nodes normal. Ext: right shoulder has full ROM but pain eeicited with full abduction and externsion  Assessment and Plan:  Hypertension Home blood pressures confirm elevations.   Trial of HCTZ 25 mg daily,  For goal bp of 130/70 using  Home machine.  baseline BMET today  GERD (gastroesophageal reflux disease) With history of Barrett's esophagus.  Continue PPI indefinitely.     Obesity (BMI 30-39.9) Her BMI remains unchanged due to lack of regular exercise and non adherence to low GI.  Fredia Beets advised him to resume the low GI diet using six smaller meals a day to stimulate his metabolism and begin a regular exercise program wit hgoal of 5 days per week , 30 mintues   Pain in joint, shoulder region With no history of trauma.  Resume meloxicam.  If no improvement  will refer to orthopedist for subacromial bursitis  Hyperglycemia Fasting glucose today was 128, which is diagnostic of diabetes melliuts,  Prior A1cs checked were in nromal range at 5.5 last yar. .  hgba1c ordered.    Updated Medication List Outpatient Encounter Prescriptions as of 08/25/2012  Medication Sig Dispense Refill  . nizatidine (AXID) 300 MG capsule TAKE 1 CAPSULE BY MOUTH AT BEDTIME.  90 capsule  1  . omeprazole (PRILOSEC) 20 MG capsule TAKE ONE CAPSULE BY MOUTH TWICE A DAY  90 capsule  3  . albuterol (PROVENTIL HFA;VENTOLIN HFA) 108 (90 BASE) MCG/ACT inhaler Inhale 2 puffs into the lungs every 6 (six) hours as needed for wheezing.  1 Inhaler  0  . chlorpheniramine-HYDROcodone (TUSSIONEX PENNKINETIC ER) 10-8 MG/5ML LQCR Take 5 mLs by mouth every 12 (twelve) hours as needed.  280 mL  0  . fish oil-omega-3 fatty acids 1000 MG capsule Take 2 g by mouth daily.      . Garlic 1000 MG CAPS Take by mouth.      . hydrochlorothiazide (HYDRODIURIL) 25 MG tablet Take 1 tablet (25 mg total) by mouth daily.  90 tablet  3  . meloxicam (MOBIC) 15 MG tablet Take 1 tablet (15 mg total) by mouth daily.  30 tablet  2  . pravastatin (PRAVACHOL) 20 MG tablet TAKE 1 TABLET BY MOUTH EVERY EVENING  90 tablet  2  . vitamin E 400 UNIT capsule Take 800 Units by mouth daily.      . [DISCONTINUED] cefdinir (OMNICEF) 300 MG capsule Take 1 capsule (300 mg total) by mouth 2 (two) times daily.  20 capsule  0   No facility-administered encounter medications on file as of 08/25/2012.     Orders Placed This Encounter  Procedures  . Comprehensive metabolic panel  . Lipid panel    No Follow-up on file.

## 2012-08-25 NOTE — Patient Instructions (Addendum)
I am starting you on HCTZ,  A mild diuretic,  For your hypertension.  Take it once daily in the morning,.  It may increase your urination for a few weeks.  Suspend it if you get a diarrheal or GI illness, until you are back to normal.  You may continue to use the meloxicam as needed for shoulder pain and can add tylenol up to 1000 mg daily for pain control  We can refer you to an orthopedist for your left shoulder pain for a steroid shot if the pain does not improve in a few weeks   Our goal is to get your BP down to < 140/80 (130/70 by your meter)   Return in one month if not at goal (6 months if your bp is better)

## 2012-08-26 ENCOUNTER — Encounter: Payer: Self-pay | Admitting: Internal Medicine

## 2012-08-26 DIAGNOSIS — E669 Obesity, unspecified: Secondary | ICD-10-CM | POA: Insufficient documentation

## 2012-08-26 DIAGNOSIS — E119 Type 2 diabetes mellitus without complications: Secondary | ICD-10-CM | POA: Insufficient documentation

## 2012-08-26 DIAGNOSIS — M25519 Pain in unspecified shoulder: Secondary | ICD-10-CM | POA: Insufficient documentation

## 2012-08-26 NOTE — Assessment & Plan Note (Signed)
Fasting glucose today was 128.  hgba1c ordered.

## 2012-08-26 NOTE — Assessment & Plan Note (Signed)
Her BMI remains unchanged due to lack of regular exercise and non adherence to low GI.  Fredia Beets advised him to resume the low GI diet using six smaller meals a day to stimulate his metabolism and begin a regular exercise program wit hgoal of 5 days per week , 30 mintues

## 2012-08-26 NOTE — Assessment & Plan Note (Signed)
Home blood pressures confirm elevations.   Trial of HCTZ 25 mg daily,  For goal bp of 130/70 using  Home machine.  baseline BMET today   

## 2012-08-26 NOTE — Assessment & Plan Note (Signed)
With no history of trauma.  Resume meloxicam.  If no improvement will refer to orthopedist for subacromial bursitis

## 2012-08-26 NOTE — Assessment & Plan Note (Signed)
With history of Barrett's esophagus.  Continue PPI indefinitely.      

## 2012-08-31 NOTE — Addendum Note (Signed)
Addended by: Sherlene Shams on: 08/31/2012 08:44 PM   Modules accepted: Orders

## 2012-10-14 ENCOUNTER — Other Ambulatory Visit: Payer: Self-pay | Admitting: Internal Medicine

## 2012-11-19 DIAGNOSIS — E669 Obesity, unspecified: Secondary | ICD-10-CM | POA: Diagnosis not present

## 2012-11-19 DIAGNOSIS — K7689 Other specified diseases of liver: Secondary | ICD-10-CM | POA: Diagnosis not present

## 2012-11-19 DIAGNOSIS — I10 Essential (primary) hypertension: Secondary | ICD-10-CM | POA: Diagnosis not present

## 2013-01-21 ENCOUNTER — Other Ambulatory Visit: Payer: Self-pay | Admitting: Internal Medicine

## 2013-03-02 ENCOUNTER — Encounter: Payer: Self-pay | Admitting: Internal Medicine

## 2013-03-02 ENCOUNTER — Ambulatory Visit (INDEPENDENT_AMBULATORY_CARE_PROVIDER_SITE_OTHER): Payer: Medicare Other | Admitting: Internal Medicine

## 2013-03-02 VITALS — BP 156/86 | HR 89 | Temp 98.4°F | Resp 16 | Wt 246.2 lb

## 2013-03-02 DIAGNOSIS — IMO0001 Reserved for inherently not codable concepts without codable children: Secondary | ICD-10-CM

## 2013-03-02 DIAGNOSIS — E669 Obesity, unspecified: Secondary | ICD-10-CM

## 2013-03-02 DIAGNOSIS — I10 Essential (primary) hypertension: Secondary | ICD-10-CM

## 2013-03-02 DIAGNOSIS — R7309 Other abnormal glucose: Secondary | ICD-10-CM

## 2013-03-02 DIAGNOSIS — E1165 Type 2 diabetes mellitus with hyperglycemia: Secondary | ICD-10-CM

## 2013-03-02 DIAGNOSIS — K760 Fatty (change of) liver, not elsewhere classified: Secondary | ICD-10-CM

## 2013-03-02 DIAGNOSIS — K7689 Other specified diseases of liver: Secondary | ICD-10-CM | POA: Diagnosis not present

## 2013-03-02 LAB — COMPREHENSIVE METABOLIC PANEL
ALT: 91 U/L — ABNORMAL HIGH (ref 0–53)
AST: 57 U/L — AB (ref 0–37)
Albumin: 4.1 g/dL (ref 3.5–5.2)
Alkaline Phosphatase: 52 U/L (ref 39–117)
BUN: 12 mg/dL (ref 6–23)
CALCIUM: 9.2 mg/dL (ref 8.4–10.5)
CHLORIDE: 97 meq/L (ref 96–112)
CO2: 30 meq/L (ref 19–32)
CREATININE: 1.2 mg/dL (ref 0.4–1.5)
GFR: 64.59 mL/min (ref >60.00)
Glucose, Bld: 151 mg/dL — ABNORMAL HIGH (ref 70–99)
Potassium: 4.3 mEq/L (ref 3.5–5.1)
Sodium: 138 mEq/L (ref 135–145)
Total Bilirubin: 1.2 mg/dL (ref 0.3–1.2)
Total Protein: 7 g/dL (ref 6.0–8.3)

## 2013-03-02 LAB — HEMOGLOBIN A1C: HEMOGLOBIN A1C: 6.3 % (ref 4.6–6.5)

## 2013-03-02 NOTE — Progress Notes (Signed)
Pre-visit discussion using our clinic review tool. No additional management support is needed unless otherwise documented below in the visit note.  

## 2013-03-02 NOTE — Patient Instructions (Addendum)
1) Your night time cough may be from Post nasal drip .  Try taking benadryl and robitussin one hour before bedtime.   2)  Your blood pressure is still elevated. It may be the ginseng.  Stop the ginseng supplement and recheck BP in one week.  IF it is still  High(>130/80)  you you can resume the ginseng and please  Add the second BP mediation I have you given today (losartan) if your repeat BP is > 130/80 .   We can combine th hctz and the losartan into one pill of you end up taking both  3) I want you to lose weight.  Your goal weight 200 lbs ( BMI < 30)   Over the next year.  This can be achieved with diet and exercise This is  One version of a  "Low GI"  Diet:  It will still lower your blood sugars and allow you to lose 4 to 8  lbs  per month if you follow it carefully.  Your goal with exercise is a minimum of 30 minutes of aerobic exercise 5 days per week (Walking does not count once it becomes easy!)    All of the foods can be found at grocery stores and in bulk at [employer].  The Atkins protein bars and shakes are available in more varieties at [employer], [employer] and [place].     7 AM Breakfast:  Choose from the following: 1)  Low carbohydrate Protein  Shakes (I recommend the EAS AdvantEdge "Carb Control" shakes  Or the low carb shakes by Atkins.    2.5 carbs   Arnold's "Sandwhich Thin"toasted  w/ peanut butter (no jelly: about 20 net carbs  "Bagel Thin" with cream cheese and salmon: about 20 carbs   a scrambled egg/bacon/cheese burrito made with [place]'s "carb balance" whole wheat tortilla  (about 10 net carbs )   Avoid cereal and bananas, oatmeal and cream of wheat and grits. They are loaded with carbohydrates!   10 AM: high protein snack  Protein bar by Atkins (the snack size, under 200 cal, usually < 6 net carbs).    A stick of cheese:  Around 1 carb,  100 cal     Dannon Light n Fit [place] Yogurt  (80 cal, 8 carbs)  Other so called "protein bars" and Greek yogurts tend to be  loaded with carbohydrates.  Remember, in food advertising, the word "energy" is synonymous for " carbohydrate."  Lunch:   A Sandwich using the bread choices listed, Can use any  Eggs,  lunchmeat, grilled meat or canned tuna), avocado, regular mayo/mustard  and cheese.  A Salad using blue cheese, ranch,  Goddess or vinagrette,  No croutons or "confetti" and no "candied nuts" but regular nuts OK.   No pretzels or chips.  Pickles and miniature sweet peppers are a good low carb alternative that provide a "crunch"  The bread is the only source of carbohydrate in a sandwich and  can be decreased by trying some of these alternatives to traditional loaf bread  Joseph's makes a pita bread and a flat bread that are 50 cal and 4 net carbs available at [place] and [place].  This can be toasted to use with hummous as well  Toufayan makes a low carb flatbread that's 100 cal and 9 net carbs available at [employer] and [employer] makes 2 sizes of  Low carb whole wheat tortilla  (The large one is 210 cal and 6  net carbs) Avoid "Low fat dressings, as well as [place] dressings They are loaded with sugar!   3 PM/ Mid day  Snack:  Consider  1 ounce of  almonds, walnuts, pistachios, pecans, peanuts,  Macadamia nuts or a nut medley.  Avoid "granola"; the dried cranberries and raisins are loaded with carbohydrates. Mixed nuts as long as there are no raisins,  cranberries or dried fruit.     6 PM  Dinner:     Meat/fowl/fish with a green salad, and either broccoli, cauliflower, green beans, spinach, brussel sprouts or  Lima beans. DO NOT BREAD THE PROTEIN!!      There is a low carb pasta by Dreamfield's that is acceptable and tastes great: only 5 digestible carbs/serving.( All grocery stores but [employer]Js carry it )  Try Hurley Cisco Angelo's chicken piccata or chicken or eggplant parm over low carb pasta.(Lowes and BJs)   Marjory Lies Sanchez's "Carnitas" (pulled pork, no sauce,  0 carbs) or his beef pot roast to make  a dinner burrito (at [employer])  Pesto over low carb pasta (bj's sells a good quality pesto in the center refrigerated section of the deli   Whole wheat pasta is still full of digestible carbs and  Not as low in glycemic index as Dreamfield's.   Brown rice is still rice,  So skip the rice and noodles if you eat [place] or [place] (or at least limit to 1/2 cup)  9 PM snack :   Breyer's "low carb" fudgsicle or  ice cream bar (Carb Smart line), or  Weight Watcher's ice cream bar , or another "no sugar added" ice cream;  a serving of fresh berries/cherries with whipped cream   Cheese or DANNON'S LlG[employer] N FIT [place] YOGURT  Avoid bananas, pineapple, grapes  and watermelon on a regular basis because they are high in sugar.  THINK OF THEM AS DESSERT  Remember that snack Substitutions should be less than 10 NET carbs per serving and meals < 20 carbs. Remember to subtract fiber grams to get the "net carbs."    Managing Your High Blood Pressure Blood pressure is a measurement of how forceful your blood is pressing against the walls of the arteries. Arteries are muscular tubes within the circulatory system. Blood pressure does not stay the same. Blood pressure rises when you are active, excited, or nervous; and it lowers during sleep and relaxation. If the numbers measuring your blood pressure stay above normal most of the time, you are at risk for health problems. High blood pressure (hypertension) is a long-term (chronic) condition in which blood pressure is elevated. A blood pressure reading is recorded as two numbers, such as 120 over 80 (or 120/80). The first, higher number is called the systolic pressure. It is a measure of the pressure in your arteries as the heart beats. The second, lower number is called the diastolic pressure. It is a measure of the pressure in your arteries as the heart relaxes between beats.  Keeping your blood pressure in a normal range is important to your overall health and prevention  of health problems, such as heart disease and stroke. When your blood pressure is uncontrolled, your heart has to work harder than normal. High blood pressure is a very common condition in adults because blood pressure tends to rise with age. Men and women are equally likely to have hypertension but at different times in life. Before age 75, men are more likely to have hypertension. After 69 years of  age, women are more likely to have it. Hypertension is especially common in African Americans. This condition often has no signs or symptoms. The cause of the condition is usually not known. Your caregiver can help you come up with a plan to keep your blood pressure in a normal, healthy range. BLOOD PRESSURE STAGES Blood pressure is classified into four stages: normal, prehypertension, stage 1, and stage 2. Your blood pressure reading will be used to determine what type of treatment, if any, is necessary. Appropriate treatment options are tied to these four stages:  Normal  Systolic pressure (mm Hg): below 120.  Diastolic pressure (mm Hg): below 80. Prehypertension  Systolic pressure (mm Hg): 120 to 139.  Diastolic pressure (mm Hg): 80 to 89. Stage1  Systolic pressure (mm Hg): 140 to 159.  Diastolic pressure (mm Hg): 90 to 99. Stage2  Systolic pressure (mm Hg): 160 or above.  Diastolic pressure (mm Hg): 100 or above. RISKS RELATED TO HIGH BLOOD PRESSURE Managing your blood pressure is an important responsibility. Uncontrolled high blood pressure can lead to:  A heart attack.  A stroke.  A weakened blood vessel (aneurysm).  Heart failure.  Kidney damage.  Eye damage.  Metabolic syndrome.  Memory and concentration problems. HOW TO MANAGE YOUR BLOOD PRESSURE Blood pressure can be managed effectively with lifestyle changes and medicines (if needed). Your caregiver will help you come up with a plan to bring your blood pressure within a normal range. Your plan should include the  following: Education  Read all information provided by your caregivers about how to control blood pressure.  Educate yourself on the latest guidelines and treatment recommendations. New research is always being done to further define the risks and treatments for high blood pressure. Lifestylechanges  Control your weight.  Avoid smoking.  Stay physically active.  Reduce the amount of salt in your diet.  Reduce stress.  Control any chronic conditions, such as high cholesterol or diabetes.  Reduce your alcohol intake. Medicines  Several medicines (antihypertensive medicines) are available, if needed, to bring blood pressure within a normal range. Communication  Review all the medicines you take with your caregiver because there may be side effects or interactions.  Talk with your caregiver about your diet, exercise habits, and other lifestyle factors that may be contributing to high blood pressure.  See your caregiver regularly. Your caregiver can help you create and adjust your plan for managing high blood pressure. RECOMMENDATIONS FOR TREATMENT AND FOLLOW-UP  The following recommendations are based on current guidelines for managing high blood pressure in nonpregnant adults. Use these recommendations to identify the proper follow-up period or treatment option based on your blood pressure reading. You can discuss these options with your caregiver.  Systolic pressure of 202 to 542 or diastolic pressure of 80 to 89: Follow up with your caregiver as directed.  Systolic pressure of 706 to 237 or diastolic pressure of 90 to 100: Follow up with your caregiver within 2 months.  Systolic pressure above 628 or diastolic pressure above 315: Follow up with your caregiver within 1 month.  Systolic pressure above 176 or diastolic pressure above 160: Consider antihypertensive therapy; follow up with your caregiver within 1 week.  Systolic pressure above 737 or diastolic pressure above 106:  Begin antihypertensive therapy; follow up with your caregiver within 1 week. Document Released: 10/31/2011 Document Reviewed: 10/31/2011 [facility] Patient Information 2014 [place], [place].

## 2013-03-02 NOTE — Assessment & Plan Note (Signed)
Home blood pressures confirm elevations.   Trial of HCTZ 25 mg daily,  For goal bp of 130/70 using  Home machine.  baseline BMET today

## 2013-03-03 ENCOUNTER — Encounter: Payer: Self-pay | Admitting: Internal Medicine

## 2013-03-03 ENCOUNTER — Encounter: Payer: Self-pay | Admitting: Emergency Medicine

## 2013-03-03 NOTE — Assessment & Plan Note (Addendum)
Followed by Dr. Gerald Dexter at [facility]. No liver biopsy done to date. Continue to strive to lose weight,  manage hyperlipidemia and avoid high glycemic index foods.

## 2013-03-03 NOTE — Progress Notes (Signed)
Patient ID: Chad Gill, male   DOB: 10-May-1945, 68 y.o.   MRN: [id number]   Patient Active Problem List   Diagnosis Date Noted  . Pain in joint, shoulder region 08/26/2012  . Type II or unspecified type diabetes mellitus without mention of complication, uncontrolled 08/26/2012  . Hypertension 01/30/2012  . Obesity (BMI 30-39.9) 07/24/2011  . GERD (gastroesophageal reflux disease)   . Hyperplastic colonic polyp   . Fatty liver disease, nonalcoholic     Subjective:  CC:   Chief Complaint  Patient presents with  . Follow-up    BP    HPI:   Chad Gill a 68 y.o. male who presents  Past Medical History  Diagnosis Date  . Barrett's esophagus 2004    last EGD 2007, no metaplasia, due in 2009  . Transaminitis   . Lumbar disc herniation 2007    L5  . Diabetes mellitus   . GERD (gastroesophageal reflux disease)   . Hyperplastic colonic polyp 2004  . Fatty liver disease, nonalcoholic     Past Surgical History  Procedure Laterality Date  . Tonsillectomy  age 35  . Small intestine surgery      carcinoid tumore, diagnosis disputed       The following portions of the patient's history were reviewed and updated as appropriate: Allergies, current medications, and problem list.    Review of Systems:   12 Pt  review of systems was negative except those addressed in the HPI,     History   Social History  . Marital Status: Married    Spouse Name: N/A    Number of Children: N/A  . Years of Education: N/A   Occupational History  . Not on file.   Social History Main Topics  . Smoking status: Former Smoker    Types: Cigarettes    Quit date: 01/21/1986  . Smokeless tobacco: Never Used  . Alcohol Use: Yes     Comment: ocassional beer  . Drug Use: No  . Sexual Activity: Not on file   Other Topics Concern  . Not on file   Social History Narrative  . No narrative on file    Objective:  Filed Vitals:   03/02/13 1055  BP: 156/86  Pulse: 89   Temp: 98.4 F (36.9 C)  Resp: 16     General appearance: alert, cooperative and appears stated age Ears: normal TM's and external ear canals both ears Throat: lips, mucosa, and tongue normal; teeth and gums normal Neck: no adenopathy, no carotid bruit, supple, symmetrical, trachea midline and thyroid not enlarged, symmetric, no tenderness/mass/nodules Back: symmetric, no curvature. ROM normal. No CVA tenderness. Lungs: clear to auscultation bilaterally Heart: regular rate and rhythm, S1, S2 normal, no murmur, click, rub or gallop Abdomen: soft, non-tender; bowel sounds normal; no masses,  no organomegaly Pulses: 2+ and symmetric Skin: Skin color, texture, turgor normal. No rashes or lesions Lymph nodes: Cervical, supraclavicular, and axillary nodes normal.  Assessment and Plan:  Hypertension Home blood pressures confirm elevations.   Trial of HCTZ 25 mg daily,  For goal bp of 130/70 using  Home machine.  baseline BMET today    Fatty liver disease, nonalcoholic Followed by Dr. Gerald [facility] at [facility]. No liver biopsy done to date. Continue to strive to lose weight,  manage hyperlipidemia and avoid high glycemic index foods.  Obesity (BMI 30-39.9) His BMI remains unchanged despite exercise. I have addressed  BMI and recommended wt loss of 10% of body weight over the next 6  months using a low glycemic index diet and regular exercise a minimum of 5 days per week.    Type II or unspecified type diabetes mellitus without mention of complication, uncontrolled Diagnosis with fasting glucose elevation an A1c of 6.3. Low glycemic index diet weight loss and exercise recommended. Referral for diabetic eye exam. Foot exam done. Return in 3 months.  A total of 30 minutes of face to face time was spent with patient more than half of which was spent in counselling and coordination of care   Updated Medication List Outpatient Encounter Prescriptions as of 03/02/2013  Medication Sig  . Ascorbic Acid  (C-500/ROSE HIPS PO) Take 1 tablet by mouth daily.  . Coenzyme Q10 (HM COQ10) 100 MG capsule Take 100 mg by mouth daily.  Marland Kitchen ECHINACEA PO Take 760 mg by mouth daily.  . fish oil-omega-3 fatty acids 1000 MG capsule Take 2 g by mouth daily.  . Garlic 3785 MG CAPS Take by mouth.  . hydrochlorothiazide (HYDRODIURIL) 25 MG tablet Take 1 tablet (25 mg total) by mouth daily.  . Korean Panax Ginseng 100 MG CAPS Take 1 capsule by mouth daily.  . Misc Natural Products (TURMERIC CURCUMIN) CAPS Take 500 each by mouth daily.  . Multiple Vitamins-Minerals (MULTIVITAMIN WITH MINERALS) tablet Take 1 tablet by mouth daily.  . nizatidine (AXID) 300 MG capsule TAKE 1 CAPSULE BY MOUTH AT BEDTIME.  Marland Kitchen omeprazole (PRILOSEC) 20 MG capsule TAKE ONE CAPSULE BY MOUTH TWICE A DAY  . pravastatin (PRAVACHOL) 20 MG tablet TAKE 1 TABLET BY MOUTH EVERY EVENING  . vitamin E 400 UNIT capsule Take 800 Units by mouth daily.  Marland Kitchen albuterol (PROVENTIL HFA;VENTOLIN HFA) 108 (90 BASE) MCG/ACT inhaler Inhale 2 puffs into the lungs every 6 (six) hours as needed for wheezing.  . chlorpheniramine-HYDROcodone (TUSSIONEX PENNKINETIC ER) 10-8 MG/5ML LQCR Take 5 mLs by mouth every 12 (twelve) hours as needed.  . meloxicam (MOBIC) 15 MG tablet Take 1 tablet (15 mg total) by mouth daily.     Orders Placed This Encounter  Procedures  . Comprehensive metabolic panel  . Hemoglobin A1c    No Follow-up on file.

## 2013-03-03 NOTE — Assessment & Plan Note (Signed)
His BMI remains unchanged despite exercise. I have addressed  BMI and recommended wt loss of 10% of body weight over the next 6 months using a low glycemic index diet and regular exercise a minimum of 5 days per week.

## 2013-03-03 NOTE — Assessment & Plan Note (Addendum)
Diagnosis with fasting glucose elevation an A1c of 6.3. Low glycemic index diet weight loss and exercise recommended. Referral for diabetic eye exam. Foot exam done. Continue pravastatin for lipid management. Return in 3 months.

## 2013-03-12 ENCOUNTER — Telehealth: Payer: Self-pay | Admitting: Internal Medicine

## 2013-03-12 NOTE — Telephone Encounter (Signed)
Appt with Raquel 03/13/13, FYI

## 2013-03-12 NOTE — Telephone Encounter (Signed)
Patient Information:  Caller Name: Hoyle Sauer  Phone: [phone]  Patient: Chad Gill  Gender: Male  DOB: [birth year]  Age: 68 Years  PCP: Deborra Medina (Adults only)  Office Follow Up:  Does the office need to follow up with this patient?: No  Instructions For The Office: N/A  RN Note:  Reports burning sensation of hemorrhoid.  Pain rated 2-6/10.  Regular BM's daily. Has new exercise bike that he cannot sit on now.  Instructed regarding use of sitz bath and otc hemorrhoid cream.  Try sitting on a "donut" by using a towel to prevent pressure on hemorrhoid.  Reduce time sitting. Dietary changes needed to keep stool soft.  No apointments remain for 03/12/13. Declined appointment at another office since they now live in [place].  Scheduled for 03/13/13 per caller request.    Symptoms  Reason For Call & Symptoms: Red/purple area protuding from rectum that bled when wiped after hard stool.  Reviewed Health History In EMR: Yes  Reviewed Medications In EMR: Yes  Reviewed Allergies In EMR: Yes  Reviewed Surgeries / Procedures: Yes  Date of Onset of Symptoms: 03/08/2013  Treatments Tried: Prep H, Anusol  Treatments Tried Worked: No  Guideline(s) Used:  Rectal Symptoms  Disposition Per Guideline:   See Today in Office  Reason For Disposition Reached:   Moderate-Severe rectal pain (i.e., interferes with school, work, or sleep)  Advice Given:  Treatment of Mild Rectal Pain:  Rectal pain and irritation can often be caused by either hemorrhoids or a tiny tear in the rectal opening (anal fissure). Small drops of blood can sometimes be seen on the toilet paper or stool in people with hemorrhoids or rectal irritation from hard bowel movements.  Warm SITZ Bath Twice a Day - Sit in a warm saline bath for 20 minutes bid to cleanse the area and to promote healing. Add 2 ounces (57 grams) of table salt or baking soda to each tub of water. Afterwards, gently pat area dry with unscented toilet  paper.  Topical Hydrocortisone Twice a Day - After taking a Sitz bath and drying your rectal area, apply 1% hydrocortisone ointment (OTC) bid to reduce irritation. Hydrocortisone is found in various OTC hemorrhoid medications (e.g., Anusol HC, Preparation H Hydrocortisone, Analpram HC Cream).  Call Back If:  Severe rectal pain or itching  Acute rectal pain due to fecal impaction is not completely relieved after treatment  Rectal pain or itching lasts over 3 days  Rectal bleeding (i.e., more than just a few drops on toilet paper from wiping)  You become worse.  Patient Will Follow Care Advice:  YES  Appointment Scheduled:  03/13/2013 14:15:00 Appointment Scheduled Provider:  Charolette Forward

## 2013-03-13 ENCOUNTER — Encounter: Payer: Self-pay | Admitting: Adult Health

## 2013-03-13 ENCOUNTER — Ambulatory Visit (INDEPENDENT_AMBULATORY_CARE_PROVIDER_SITE_OTHER): Payer: Medicare Other | Admitting: Adult Health

## 2013-03-13 VITALS — BP 122/70 | HR 86 | Resp 12 | Wt 246.5 lb

## 2013-03-13 DIAGNOSIS — K645 Perianal venous thrombosis: Secondary | ICD-10-CM | POA: Diagnosis not present

## 2013-03-13 DIAGNOSIS — K649 Unspecified hemorrhoids: Secondary | ICD-10-CM | POA: Diagnosis not present

## 2013-03-13 NOTE — Progress Notes (Signed)
Subjective:    Patient ID: Chad Gill, male    DOB: 11/10/1945, 67 y.o.   MRN: [id number]  HPI  Pt is a pleasant 68 y/o male with hx of DM, Barrett's esophagus, Fatty liver, hyperplastic colonic polyps who presents to clinic with reports of blood on tissue paper when wipes after BM. He does not recall ever having hx of hemorrhoids. Reports colonoscopy (within 10 years) with hyperplastic colonic polyps. Has tried preparation H. Pt reports having to wear a small pad 2/2 constant oozing of blood. Reports recent URI with coughing. Symptoms began with one of his episodes of coughing. He denies constipation. Reports daily BM. Other than these symptoms, he is feeling well.    Past Medical History  Diagnosis Date  . Barrett's esophagus 2004    last EGD 2007, no metaplasia, due in 2009  . Transaminitis   . Lumbar disc herniation 2007    L5  . Diabetes mellitus   . GERD (gastroesophageal reflux disease)   . Hyperplastic colonic polyp 2004  . Fatty liver disease, nonalcoholic      Past Surgical History  Procedure Laterality Date  . Tonsillectomy  age 48  . Small intestine surgery      carcinoid tumore, diagnosis disputed     Family History  Problem Relation Age of Onset  . Hypertension Mother      History   Social History  . Marital Status: Married    Spouse Name: N/A    Number of Children: N/A  . Years of Education: N/A   Occupational History  . Not on file.   Social History Main Topics  . Smoking status: Former Smoker    Types: Cigarettes    Quit date: 01/21/1986  . Smokeless tobacco: Never Used  . Alcohol Use: Yes     Comment: ocassional beer  . Drug Use: No  . Sexual Activity: Not on file   Other Topics Concern  . Not on file   Social History Narrative  . No narrative on file     Current Outpatient Prescriptions on File Prior to Visit  Medication Sig Dispense Refill  . albuterol (PROVENTIL HFA;VENTOLIN HFA) 108 (90 BASE) MCG/ACT inhaler Inhale 2  puffs into the lungs every 6 (six) hours as needed for wheezing.  1 Inhaler  0  . Ascorbic Acid (C-500/ROSE HIPS PO) Take 1 tablet by mouth daily.      . Coenzyme Q10 (HM COQ10) 100 MG capsule Take 100 mg by mouth daily.      Marland Kitchen ECHINACEA PO Take 760 mg by mouth daily.      . fish oil-omega-3 fatty acids 1000 MG capsule Take 2 g by mouth daily.      . Garlic 7322 MG CAPS Take by mouth.      . hydrochlorothiazide (HYDRODIURIL) 25 MG tablet Take 1 tablet (25 mg total) by mouth daily.  90 tablet  3  . Korean Panax Ginseng 100 MG CAPS Take 1 capsule by mouth daily.      . meloxicam (MOBIC) 15 MG tablet Take 1 tablet (15 mg total) by mouth daily.  30 tablet  2  . Misc Natural Products (TURMERIC CURCUMIN) CAPS Take 500 each by mouth daily.      . Multiple Vitamins-Minerals (MULTIVITAMIN WITH MINERALS) tablet Take 1 tablet by mouth daily.      . nizatidine (AXID) 300 MG capsule TAKE 1 CAPSULE BY MOUTH AT BEDTIME.  90 capsule  1  . omeprazole (PRILOSEC) 20 MG capsule  TAKE ONE CAPSULE BY MOUTH TWICE A DAY  90 capsule  3  . pravastatin (PRAVACHOL) 20 MG tablet TAKE 1 TABLET BY MOUTH EVERY EVENING  90 tablet  2  . vitamin E 400 UNIT capsule Take 800 Units by mouth daily.       No current facility-administered medications on file prior to visit.    Review of Systems  Constitutional: Negative.   Respiratory: Negative.   Cardiovascular: Negative.   Gastrointestinal: Positive for anal bleeding. Negative for diarrhea, constipation and rectal pain.  Genitourinary: Negative.   Psychiatric/Behavioral: Negative.   All other systems reviewed and are negative.       Objective:   Physical Exam  Constitutional: He is oriented to person, place, and time. No distress.  Pleasant 68 y/o male in NAD  Cardiovascular: Normal rate and regular rhythm.   Pulmonary/Chest: Effort normal. No respiratory distress.  Abdominal:  Hemorrhoid with ulceration oozing blood. Noted pad with blood and also on underwear. Odor  suggestive of GI bleed  Neurological: He is alert and oriented to person, place, and time.  Skin: Skin is warm and dry.  Psychiatric: He has a normal mood and affect. His behavior is normal. Judgment and thought content normal.          Assessment & Plan:

## 2013-03-13 NOTE — Progress Notes (Signed)
Pre visit review using our clinic review tool, if applicable. No additional management support is needed unless otherwise documented below in the visit note. 

## 2013-03-13 NOTE — Assessment & Plan Note (Signed)
Ulceration noted on external hemorrhoid oozing blood. Refer to Dr. Tamala Julian, Gen Surgery, for further evaluation. He has appointment today at 3:30 pm. Sent patient over immediately after visit to fill out new patient questionnaire, etc.

## 2013-03-25 ENCOUNTER — Telehealth: Payer: Self-pay | Admitting: Internal Medicine

## 2013-03-25 NOTE — Telephone Encounter (Signed)
Relevant patient education assigned to patient using Emmi. ° °

## 2013-05-03 ENCOUNTER — Other Ambulatory Visit: Payer: Self-pay | Admitting: Internal Medicine

## 2013-05-27 DIAGNOSIS — I10 Essential (primary) hypertension: Secondary | ICD-10-CM | POA: Diagnosis not present

## 2013-05-27 DIAGNOSIS — E669 Obesity, unspecified: Secondary | ICD-10-CM | POA: Diagnosis not present

## 2013-05-27 DIAGNOSIS — K7689 Other specified diseases of liver: Secondary | ICD-10-CM | POA: Diagnosis not present

## 2013-06-03 ENCOUNTER — Encounter: Payer: Self-pay | Admitting: Internal Medicine

## 2013-06-03 ENCOUNTER — Ambulatory Visit (INDEPENDENT_AMBULATORY_CARE_PROVIDER_SITE_OTHER): Payer: Medicare Other | Admitting: Internal Medicine

## 2013-06-03 VITALS — BP 148/78 | HR 72 | Temp 98.1°F | Resp 18 | Wt 246.8 lb

## 2013-06-03 DIAGNOSIS — K219 Gastro-esophageal reflux disease without esophagitis: Secondary | ICD-10-CM

## 2013-06-03 DIAGNOSIS — E119 Type 2 diabetes mellitus without complications: Secondary | ICD-10-CM | POA: Diagnosis not present

## 2013-06-03 DIAGNOSIS — H9319 Tinnitus, unspecified ear: Secondary | ICD-10-CM | POA: Diagnosis not present

## 2013-06-03 DIAGNOSIS — H9313 Tinnitus, bilateral: Secondary | ICD-10-CM | POA: Insufficient documentation

## 2013-06-03 DIAGNOSIS — Z23 Encounter for immunization: Secondary | ICD-10-CM

## 2013-06-03 DIAGNOSIS — K760 Fatty (change of) liver, not elsewhere classified: Secondary | ICD-10-CM

## 2013-06-03 DIAGNOSIS — E871 Hypo-osmolality and hyponatremia: Secondary | ICD-10-CM

## 2013-06-03 DIAGNOSIS — I10 Essential (primary) hypertension: Secondary | ICD-10-CM

## 2013-06-03 DIAGNOSIS — E785 Hyperlipidemia, unspecified: Secondary | ICD-10-CM

## 2013-06-03 DIAGNOSIS — E1165 Type 2 diabetes mellitus with hyperglycemia: Secondary | ICD-10-CM

## 2013-06-03 DIAGNOSIS — IMO0001 Reserved for inherently not codable concepts without codable children: Secondary | ICD-10-CM

## 2013-06-03 DIAGNOSIS — K7689 Other specified diseases of liver: Secondary | ICD-10-CM

## 2013-06-03 DIAGNOSIS — E669 Obesity, unspecified: Secondary | ICD-10-CM

## 2013-06-03 LAB — COMPREHENSIVE METABOLIC PANEL
ALK PHOS: 49 U/L (ref 39–117)
ALT: 107 U/L — ABNORMAL HIGH (ref 0–53)
AST: 57 U/L — ABNORMAL HIGH (ref 0–37)
Albumin: 4.2 g/dL (ref 3.5–5.2)
BUN: 12 mg/dL (ref 6–23)
CHLORIDE: 95 meq/L — AB (ref 96–112)
CO2: 32 mEq/L (ref 19–32)
CREATININE: 1.2 mg/dL (ref 0.4–1.5)
Calcium: 9.3 mg/dL (ref 8.4–10.5)
GFR: 65.17 mL/min (ref >60.00)
GLUCOSE: 148 mg/dL — AB (ref 70–99)
POTASSIUM: 4.4 meq/L (ref 3.5–5.1)
SODIUM: 134 meq/L — AB (ref 135–145)
Total Bilirubin: 1.4 mg/dL — ABNORMAL HIGH (ref 0.3–1.2)
Total Protein: 7 g/dL (ref 6.0–8.3)

## 2013-06-03 LAB — LIPID PANEL
CHOL/HDL RATIO: 5
CHOLESTEROL: 183 mg/dL (ref 0–200)
HDL: 36.7 mg/dL — ABNORMAL LOW (ref >39.00)
LDL Cholesterol: 117 mg/dL — ABNORMAL HIGH (ref 0–99)
Triglycerides: 147 mg/dL (ref 0.0–149.0)
VLDL: 29.4 mg/dL (ref 0.0–40.0)

## 2013-06-03 LAB — HM DIABETES FOOT EXAM: HM DIABETIC FOOT EXAM: NORMAL

## 2013-06-03 LAB — MICROALBUMIN / CREATININE URINE RATIO
Creatinine,U: 119.5 mg/dL
MICROALB/CREAT RATIO: 0.2 mg/g (ref 0.0–30.0)
Microalb, Ur: 0.2 mg/dL (ref 0.0–1.9)

## 2013-06-03 LAB — HEMOGLOBIN A1C: HEMOGLOBIN A1C: 6.3 % (ref 4.6–6.5)

## 2013-06-03 NOTE — Progress Notes (Signed)
Pre-visit discussion using our clinic review tool. No additional management support is needed unless otherwise documented below in the visit note.  

## 2013-06-03 NOTE — Patient Instructions (Signed)
I want you to lose 24 lbs or 105 of current wt over 12 months  With diet and regular exercise.  I may be adding a second blood pressure medication to take with your HTCZ pending labs today  Referral to ENT for hearing test   This is  One version of a  "Low GI"  Diet:  It will still lower your blood sugars and allow you to lose 4 to 8  lbs  per month if you follow it carefully.  Your goal with exercise is a minimum of 30 minutes of aerobic exercise 5 days per week (Walking does not count once it becomes easy!)    All of the foods can be found at grocery stores and in bulk at [employer].  The Atkins protein bars and shakes are available in more varieties at [employer], [employer] and [place].     7 AM Breakfast:  Choose from the following:  Low carbohydrate Protein  Shakes (I recommend the EAS AdvantEdge "Carb Control" shakes  Or the low carb shakes by Atkins.    2.5 carbs   Arnold's "Sandwhich Thin"toasted  w/ peanut butter (no jelly: about 20 net carbs  "Bagel Thin" with cream cheese and salmon: about 20 carbs   a scrambled egg/bacon/cheese burrito made with Mission's "carb balance" whole wheat tortilla  (about 10 net carbs )   Avoid cereal and bananas, oatmeal and cream of wheat and grits. They are loaded with carbohydrates!   10 AM: high protein snack  Protein bar by Atkins (the snack size, under 200 cal, usually < 6 net carbs).    A stick of cheese:  Around 1 carb,  100 cal     Dannon Light n Fit [place] Yogurt  (80 cal, 8 carbs)  Other so called "protein bars" and Greek yogurts tend to be loaded with carbohydrates.  Remember, in food advertising, the word "energy" is synonymous for " carbohydrate."  Lunch:   A Sandwich using the bread choices listed, Can use any  Eggs,  lunchmeat, grilled meat or canned tuna), avocado, regular mayo/mustard  and cheese.  A Salad using blue cheese, ranch,  Goddess or vinagrette,  No croutons or "confetti" and no "candied nuts" but regular nuts OK.   No  pretzels or chips.  Pickles and miniature sweet peppers are a good low carb alternative that provide a "crunch"  The bread is the only source of carbohydrate in a sandwich and  can be decreased by trying some of these alternatives to traditional loaf bread  Joseph's makes a pita bread and a flat bread that are 50 cal and 4 net carbs available at [place] and [place].  This can be toasted to use with hummous as well  Toufayan makes a low carb flatbread that's 100 cal and 9 net carbs available at [employer] and [employer]'s makes 2 sizes of  Low carb whole wheat tortilla  (The large one is 210 cal and 6 net carbs) Avoid "Low fat dressings, as well as Barry Brunner and [place] dressings They are loaded with sugar!   3 PM/ Mid day  Snack:  Consider  1 ounce of  almonds, walnuts, pistachios, pecans, peanuts,  Macadamia nuts or a nut medley.  Avoid "granola"; the dried cranberries and raisins are loaded with carbohydrates. Mixed nuts as long as there are no raisins,  cranberries or dried fruit.     6 PM  Dinner:     Meat/fowl/fish with a green salad,  and either broccoli, cauliflower, green beans, spinach, brussel sprouts or  Lima beans. DO NOT BREAD THE PROTEIN!!      There is a low carb pasta by Dreamfield's that is acceptable and tastes great: only 5 digestible carbs/serving.( All grocery stores but BJs carry it )  Try Hurley Cisco Angelo's chicken piccata or chicken or eggplant parm over low carb pasta.(Lowes and BJs)   Marjory Lies Sanchez's "Carnitas" (pulled pork, no sauce,  0 carbs) or his beef pot roast to make a dinner burrito (at [employer]'s)  Pesto over low carb pasta (bj's sells a good quality pesto in the center refrigerated section of the deli   Whole wheat pasta is still full of digestible carbs and  Not as low in glycemic index as Dreamfield's.   Brown rice is still rice,  So skip the rice and noodles if you eat [place] or [place] (or at least limit to 1/2 cup)  9 PM snack :   Breyer's "low carb" fudgsicle  or  ice cream bar (Carb Smart line), or  Weight Watcher's ice cream bar , or another "no sugar added" ice cream;  a serving of fresh berries/cherries with whipped cream   Cheese or DANNON'S LlGHT N FIT [place] YOGURT  Avoid bananas, pineapple, grapes  and watermelon on a regular basis because they are high in sugar.  THINK OF THEM AS DESSERT  Remember that snack Substitutions should be less than 10 NET carbs per serving and meals < 20 carbs. Remember to subtract fiber grams to get the "net carbs."   Tinnitus Sounds you hear in your ears and coming from within the ear is called tinnitus. This can be a symptom of many ear disorders. It is often associated with hearing loss.  Tinnitus can be seen with:  Infections.  Ear blockages such as wax buildup.  Meniere's disease.  Ear damage.  Inherited.  Occupational causes. While irritating, it is not usually a threat to health. When the cause of the tinnitus is wax, infection in the middle ear, or foreign body it is easily treated. Hearing loss will usually be reversible.  TREATMENT  When treating the underlying cause does not get rid of tinnitus, it may be necessary to get rid of the unwanted sound by covering it up with more pleasant background noises. This may include music, the radio etc. There are tinnitus maskers which can be worn which produce background noise to cover up the tinnitus. Avoid all medications which tend to make tinnitus worse such as alcohol, caffeine, aspirin, and nicotine. There are many soothing background tapes such as rain, ocean, thunderstorms, etc. These soothing sounds help with sleeping or resting. Keep all follow-up appointments and referrals. This is important to identify the cause of the problem. It also helps avoid complications, impaired hearing, disability, or chronic pain. Document Released: 02/05/2005 Document Revised: 04/30/2011 Document Reviewed: 09/24/2007 [facility] Patient Information 2014 [place].

## 2013-06-03 NOTE — Progress Notes (Addendum)
Patient ID: Chad Gill, male   DOB: 1945-10-16, 68 y.o.   MRN: [id number]  Patient Active Problem List   Diagnosis Date Noted  . Tinnitus of both ears 06/03/2013  . Bleeding hemorrhoid 03/13/2013  . Pain in joint, shoulder region 08/26/2012  . Type II or unspecified type diabetes mellitus without mention of complication, uncontrolled 08/26/2012  . Hypertension 01/30/2012  . Obesity (BMI 30-39.9) 07/24/2011  . GERD (gastroesophageal reflux disease)   . Hyperplastic colonic polyp   . Fatty liver disease, nonalcoholic     Subjective:  CC:   Chief Complaint  Patient presents with  . Follow-up  . Diabetes    HPI:   Chad Gill is a 68 y.o. male who presents for Follow up on chronic conditions including fatty liver,  Obesity, hypertension,  Type 2 DM.  Her blood sugars were well controlled at last visit in January . He has been following a healthy diet but has not lost weight . He has no daily or regular exercise. No new complaints.  Lab Results  Component Value Date   HGBA1C 6.3 03/02/2013      Past Medical History  Diagnosis Date  . Barrett's esophagus 2004    last EGD 2007, no metaplasia, due in 2009  . Transaminitis   . Lumbar disc herniation 2007    L5  . Diabetes mellitus   . GERD (gastroesophageal reflux disease)   . Hyperplastic colonic polyp 2004  . Fatty liver disease, nonalcoholic     Past Surgical History  Procedure Laterality Date  . Tonsillectomy  age 88  . Small intestine surgery      carcinoid tumore, diagnosis disputed       The following portions of the patient's history were reviewed and updated as appropriate: Allergies, current medications, and problem list.    Review of Systems:   Patient denies headache, fevers, malaise, unintentional weight loss, skin rash, eye pain, sinus congestion and sinus pain, sore throat, dysphagia,  hemoptysis , cough, dyspnea, wheezing, chest pain, palpitations, orthopnea, edema, abdominal pain, nausea,  melena, diarrhea, constipation, flank pain, dysuria, hematuria, urinary  Frequency, nocturia, numbness, tingling, seizures,  Focal weakness, Loss of consciousness,  Tremor, insomnia, depression, anxiety, and suicidal ideation.     History   Social History  . Marital Status: Married    Spouse Name: N/A    Number of Children: N/A  . Years of Education: N/A   Occupational History  . Not on file.   Social History Main Topics  . Smoking status: Former Smoker    Types: Cigarettes    Quit date: 01/21/1986  . Smokeless tobacco: Never Used  . Alcohol Use: Yes     Comment: ocassional beer  . Drug Use: No  . Sexual Activity: Not on file   Other Topics Concern  . Not on file   Social History Narrative  . No narrative on file    Objective:  Filed Vitals:   06/03/13 1103  BP: 148/78  Pulse: 72  Temp: 98.1 F (36.7 C)  Resp: 18     General appearance: alert, cooperative and appears stated age Ears: normal TM's and external ear canals both ears Throat: lips, mucosa, and tongue normal; teeth and gums normal Neck: no adenopathy, no carotid bruit, supple, symmetrical, trachea midline and thyroid not enlarged, symmetric, no tenderness/mass/nodules Back: symmetric, no curvature. ROM normal. No CVA tenderness. Lungs: clear to auscultation bilaterally Heart: regular rate and rhythm, S1, S2 normal, no murmur, click, rub or gallop Abdomen:  soft, non-tender; bowel sounds normal; no masses,  no organomegaly Pulses: 2+ and symmetric Skin: Skin color, texture, turgor normal. No rashes or lesions Lymph nodes: Cervical, supraclavicular, and axillary nodes normal.  Assessment and Plan:  GERD (gastroesophageal reflux disease) With history of Barrett's esophagus.  Continue PPI indefinitely.       Fatty liver disease, nonalcoholic Followed by Dr. Gerald Dexter at [facility]. No liver biopsy done to date. Continue to strive to lose weight,  manage hyperlipidemia and avoid high glycemic index  foods.  Lab Results  Component Value Date   ALT 107* 06/03/2013   AST 57* 06/03/2013   ALKPHOS 49 06/03/2013   BILITOT 1.4* 06/03/2013    Hypertension Well controlled on current regimen. Renal function stable, no changes today. Lab Results  Component Value Date   CREATININE 1.2 06/03/2013     Type II or unspecified type diabetes mellitus without mention of complication, uncontrolled Historically well-controlled on diet.   hemoglobin A1c has been consistently at or  less than 7.0 . Patient is up-to-date on eye exams and foot exam is normal today. Patient  Has no proteinura .  LDL is above goal and patient is tolerating statin therapy for CAD risk reduction so will increase dose to 40 mg   Lab Results  Component Value Date   HGBA1C 6.3 06/03/2013    Lab Results  Component Value Date   CHOL 183 06/03/2013   HDL 36.70* 06/03/2013   LDLCALC 117* 06/03/2013   TRIG 147.0 06/03/2013   CHOLHDL 5 06/03/2013   Lab Results  Component Value Date   MICROALBUR 0.2 06/03/2013     Obesity (BMI 30-39.9) I have addressed  BMI and recommended wt loss of 10% of body weight over the next 6 months using a low glycemic index diet and regular exercise a minimum of 5 days per week.    Tinnitus of both ears Secondary to prolonged exposure to loud noises  Referral to ENT for audiology exam   Hyponatremia dvised to suspend  HCTZ and repeat BMET one week     Updated Medication List Outpatient Encounter Prescriptions as of 06/03/2013  Medication Sig  . Ascorbic Acid (C-500/ROSE HIPS PO) Take 1 tablet by mouth daily.  . Coenzyme Q10 (HM COQ10) 100 MG capsule Take 100 mg by mouth daily.  Marland Kitchen ECHINACEA PO Take 760 mg by mouth daily.  . fish oil-omega-3 fatty acids 1000 MG capsule Take 2 g by mouth daily.  . Garlic 4270 MG CAPS Take by mouth.  . meloxicam (MOBIC) 15 MG tablet Take 1 tablet (15 mg total) by mouth daily.  . Misc Natural Products (TURMERIC CURCUMIN) CAPS Take 500 each by mouth daily.  .  Multiple Vitamins-Minerals (MULTIVITAMIN WITH MINERALS) tablet Take 1 tablet by mouth daily.  . nizatidine (AXID) 300 MG capsule TAKE 1 CAPSULE BY MOUTH AT BEDTIME.  Marland Kitchen omeprazole (PRILOSEC) 20 MG capsule TAKE ONE CAPSULE BY MOUTH TWICE A DAY  . pravastatin (PRAVACHOL) 40 MG tablet TAKE 1 TABLET BY MOUTH EVERY EVENING  . vitamin E 400 UNIT capsule Take 800 Units by mouth daily.  . [DISCONTINUED] hydrochlorothiazide (HYDRODIURIL) 25 MG tablet Take 1 tablet (25 mg total) by mouth daily.  . [DISCONTINUED] pravastatin (PRAVACHOL) 20 MG tablet TAKE 1 TABLET BY MOUTH EVERY EVENING  . albuterol (PROVENTIL HFA;VENTOLIN HFA) 108 (90 BASE) MCG/ACT inhaler Inhale 2 puffs into the lungs every 6 (six) hours as needed for wheezing.  . Korean Panax Ginseng 100 MG CAPS Take 1 capsule by mouth  daily.

## 2013-06-04 ENCOUNTER — Encounter: Payer: Self-pay | Admitting: Internal Medicine

## 2013-06-04 NOTE — Assessment & Plan Note (Signed)
With history of Barrett's esophagus.  Continue PPI indefinitely.

## 2013-06-04 NOTE — Assessment & Plan Note (Signed)
Secondary to prolonged exposure to loud noises  Referral to ENT for audiology exam

## 2013-06-04 NOTE — Assessment & Plan Note (Signed)
Followed by Dr. Gerald Dexter at [facility]. No liver biopsy done to date. Continue to strive to lose weight,  manage hyperlipidemia and avoid high glycemic index foods.  Lab Results  Component Value Date   ALT 107* 06/03/2013   AST 57* 06/03/2013   ALKPHOS 49 06/03/2013   BILITOT 1.4* 06/03/2013

## 2013-06-04 NOTE — Assessment & Plan Note (Signed)
Well controlled on current regimen. Renal function stable, no changes today. Lab Results  Component Value Date   CREATININE 1.2 06/03/2013

## 2013-06-04 NOTE — Assessment & Plan Note (Signed)
I have addressed  BMI and recommended wt loss of 10% of body weight over the next 6 months using a low glycemic index diet and regular exercise a minimum of 5 days per week.   

## 2013-06-04 NOTE — Assessment & Plan Note (Addendum)
Historically well-controlled on diet.   hemoglobin A1c has been consistently at or  less than 7.0 . Patient is up-to-date on eye exams and foot exam is normal today. Patient  Has no proteinura .  LDL is above goal and patient is tolerating statin therapy for CAD risk reduction so will increase dose to 40 mg   Lab Results  Component Value Date   HGBA1C 6.3 06/03/2013    Lab Results  Component Value Date   CHOL 183 06/03/2013   HDL 36.70* 06/03/2013   LDLCALC 117* 06/03/2013   TRIG 147.0 06/03/2013   CHOLHDL 5 06/03/2013   Lab Results  Component Value Date   MICROALBUR 0.2 06/03/2013

## 2013-06-05 ENCOUNTER — Encounter: Payer: Self-pay | Admitting: Internal Medicine

## 2013-06-05 DIAGNOSIS — E871 Hypo-osmolality and hyponatremia: Secondary | ICD-10-CM | POA: Insufficient documentation

## 2013-06-05 MED ORDER — PRAVASTATIN SODIUM 40 MG PO TABS: ORAL_TABLET | ORAL | Status: DC

## 2013-06-05 NOTE — Assessment & Plan Note (Signed)
dvised to suspend  HCTZ and repeat BMET one week

## 2013-06-05 NOTE — Addendum Note (Signed)
Addended by: Crecencio Mc on: 06/05/2013 06:52 AM   Modules accepted: Orders, Medications

## 2013-06-18 ENCOUNTER — Other Ambulatory Visit (INDEPENDENT_AMBULATORY_CARE_PROVIDER_SITE_OTHER): Payer: Medicare Other

## 2013-06-18 DIAGNOSIS — E871 Hypo-osmolality and hyponatremia: Secondary | ICD-10-CM | POA: Diagnosis not present

## 2013-06-18 DIAGNOSIS — H9319 Tinnitus, unspecified ear: Secondary | ICD-10-CM | POA: Diagnosis not present

## 2013-06-18 DIAGNOSIS — H903 Sensorineural hearing loss, bilateral: Secondary | ICD-10-CM | POA: Diagnosis not present

## 2013-06-18 LAB — BASIC METABOLIC PANEL
BUN: 11 mg/dL (ref 6–23)
CHLORIDE: 102 meq/L (ref 96–112)
CO2: 29 mEq/L (ref 19–32)
CREATININE: 1.1 mg/dL (ref 0.4–1.5)
Calcium: 8.9 mg/dL (ref 8.4–10.5)
GFR: 67.81 mL/min (ref >60.00)
GLUCOSE: 154 mg/dL — AB (ref 70–99)
POTASSIUM: 4.7 meq/L (ref 3.5–5.1)
Sodium: 137 mEq/L (ref 135–145)

## 2013-06-20 ENCOUNTER — Encounter: Payer: Self-pay | Admitting: Internal Medicine

## 2013-07-24 ENCOUNTER — Other Ambulatory Visit: Payer: Self-pay | Admitting: *Deleted

## 2013-07-24 MED ORDER — NIZATIDINE 300 MG PO CAPS: ORAL_CAPSULE | ORAL | Status: DC

## 2013-07-27 DIAGNOSIS — H2510 Age-related nuclear cataract, unspecified eye: Secondary | ICD-10-CM | POA: Diagnosis not present

## 2013-09-04 ENCOUNTER — Telehealth: Payer: Self-pay | Admitting: *Deleted

## 2013-09-04 DIAGNOSIS — E119 Type 2 diabetes mellitus without complications: Secondary | ICD-10-CM

## 2013-09-04 LAB — HM DIABETES EYE EXAM

## 2013-09-04 NOTE — Telephone Encounter (Signed)
Pt is coming in on Monday what labs and dx?

## 2013-09-07 ENCOUNTER — Other Ambulatory Visit (INDEPENDENT_AMBULATORY_CARE_PROVIDER_SITE_OTHER): Payer: Medicare Other

## 2013-09-07 DIAGNOSIS — E119 Type 2 diabetes mellitus without complications: Secondary | ICD-10-CM

## 2013-09-07 LAB — MICROALBUMIN / CREATININE URINE RATIO
Creatinine,U: 165.8 mg/dL
Microalb Creat Ratio: 0.1 mg/g (ref 0.0–30.0)
Microalb, Ur: 0.2 mg/dL (ref 0.0–1.9)

## 2013-09-07 LAB — COMPREHENSIVE METABOLIC PANEL
ALT: 74 U/L — ABNORMAL HIGH (ref 0–53)
AST: 48 U/L — ABNORMAL HIGH (ref 0–37)
Albumin: 4.3 g/dL (ref 3.5–5.2)
Alkaline Phosphatase: 53 U/L (ref 39–117)
BILIRUBIN TOTAL: 1.5 mg/dL — AB (ref 0.2–1.2)
BUN: 13 mg/dL (ref 6–23)
CALCIUM: 9.1 mg/dL (ref 8.4–10.5)
CHLORIDE: 104 meq/L (ref 96–112)
CO2: 27 meq/L (ref 19–32)
CREATININE: 1.2 mg/dL (ref 0.4–1.5)
GFR: 62.67 mL/min (ref >60.00)
GLUCOSE: 145 mg/dL — AB (ref 70–99)
Potassium: 4.7 mEq/L (ref 3.5–5.1)
Sodium: 138 mEq/L (ref 135–145)
Total Protein: 6.9 g/dL (ref 6.0–8.3)

## 2013-09-07 LAB — LIPID PANEL
CHOL/HDL RATIO: 5
Cholesterol: 186 mg/dL (ref 0–200)
HDL: 36.3 mg/dL — AB (ref >39.00)
LDL CALC: 120 mg/dL — AB (ref 0–99)
NonHDL: 149.7
Triglycerides: 148 mg/dL (ref 0.0–149.0)
VLDL: 29.6 mg/dL (ref 0.0–40.0)

## 2013-09-07 LAB — HEMOGLOBIN A1C: Hgb A1c MFr Bld: 6.1 % (ref 4.6–6.5)

## 2013-09-08 ENCOUNTER — Encounter: Payer: Self-pay | Admitting: Internal Medicine

## 2013-09-09 LAB — HM DIABETES EYE EXAM

## 2013-09-10 ENCOUNTER — Encounter: Payer: Self-pay | Admitting: Internal Medicine

## 2013-09-10 ENCOUNTER — Ambulatory Visit (INDEPENDENT_AMBULATORY_CARE_PROVIDER_SITE_OTHER): Payer: Medicare Other | Admitting: Internal Medicine

## 2013-09-10 VITALS — BP 158/82 | HR 70 | Resp 14 | Ht 69.0 in | Wt 243.2 lb

## 2013-09-10 DIAGNOSIS — E669 Obesity, unspecified: Secondary | ICD-10-CM

## 2013-09-10 DIAGNOSIS — E1165 Type 2 diabetes mellitus with hyperglycemia: Secondary | ICD-10-CM

## 2013-09-10 DIAGNOSIS — Z9889 Other specified postprocedural states: Secondary | ICD-10-CM

## 2013-09-10 DIAGNOSIS — Z23 Encounter for immunization: Secondary | ICD-10-CM | POA: Diagnosis not present

## 2013-09-10 DIAGNOSIS — K7689 Other specified diseases of liver: Secondary | ICD-10-CM | POA: Diagnosis not present

## 2013-09-10 DIAGNOSIS — Z79899 Other long term (current) drug therapy: Secondary | ICD-10-CM | POA: Diagnosis not present

## 2013-09-10 DIAGNOSIS — K760 Fatty (change of) liver, not elsewhere classified: Secondary | ICD-10-CM

## 2013-09-10 DIAGNOSIS — Z86010 Personal history of colonic polyps: Secondary | ICD-10-CM | POA: Diagnosis not present

## 2013-09-10 DIAGNOSIS — IMO0001 Reserved for inherently not codable concepts without codable children: Secondary | ICD-10-CM

## 2013-09-10 DIAGNOSIS — Z974 Presence of external hearing-aid: Secondary | ICD-10-CM

## 2013-09-10 DIAGNOSIS — I10 Essential (primary) hypertension: Secondary | ICD-10-CM | POA: Diagnosis not present

## 2013-09-10 MED ORDER — LISINOPRIL 5 MG PO TABS: 5.0000 mg | ORAL_TABLET | Freq: Every day | ORAL | Status: DC

## 2013-09-10 NOTE — Progress Notes (Signed)
Pre visit review using our clinic review tool, if applicable. No additional management support is needed unless otherwise documented below in the visit note. 

## 2013-09-10 NOTE — Assessment & Plan Note (Addendum)
Recommending starting statin therapy and receiving hep A/hep b vaccines

## 2013-09-10 NOTE — Assessment & Plan Note (Addendum)
Adding lisinopril  Today for BP not at goal

## 2013-09-10 NOTE — Patient Instructions (Signed)
You received the first of 3 hepatitis A/B vaccines today (today ,  1 month and 6 months )   Controlling your cholesterol is one of the fundamental principles in managing fatty liver .  I do recommend a trial of pravastatin to lower your cholesterol.  We will recheck you liver enzymes when you return in one month for your second Hepatitis vaccine

## 2013-09-11 ENCOUNTER — Encounter: Payer: Self-pay | Admitting: *Deleted

## 2013-09-11 NOTE — Progress Notes (Signed)
Chart reviewed for DM bundle.  OV 09/10/13 with labs. Appt sch 10/05/13

## 2013-09-12 NOTE — Progress Notes (Signed)
Patient ID: Chad Gill, male   DOB: [birth year], 68 y.o.   MRN: [id number]  Patient Active Problem List   Diagnosis Date Noted  . Uses hearing aid 09/10/2013  . Hyponatremia 06/05/2013  . Tinnitus of both ears 06/03/2013  . Bleeding hemorrhoid 03/13/2013  . Pain in joint, shoulder region 08/26/2012  . Type II or unspecified type diabetes mellitus without mention of complication, uncontrolled 08/26/2012  . Hypertension 01/30/2012  . Obesity (BMI 30-39.9) 07/24/2011  . GERD (gastroesophageal reflux disease)   . Hyperplastic colonic polyp   . Fatty liver disease, nonalcoholic     Subjective:  CC:   Chief Complaint  Patient presents with  . Follow-up    3 month follow up DM    HPI:   Chad Gill is a 68 y.o. male who presents for  Follow up on chronic medical conditions including NASH, hypertension, type  DM and obesity.  He has been keeping his carbohydrate content well under the recommended 20  carbs per meal by elininiating sweetened tea and soda and cutting out bagels for breakfast.  \He checks  sugars once daily in the morning, usually.   cbgs have been consistently under 130.       Past Medical History  Diagnosis Date  . Barrett's esophagus 2004    last EGD 2007, no metaplasia, due in 2009  . Transaminitis   . Lumbar disc herniation 2007    L5  . Diabetes mellitus   . GERD (gastroesophageal reflux disease)   . Hyperplastic colonic polyp 2004  . Fatty liver disease, nonalcoholic     Past Surgical History  Procedure Laterality Date  . Tonsillectomy  age 50  . Small intestine surgery      carcinoid tumore, diagnosis disputed       The following portions of the patient's history were reviewed and updated as appropriate: Allergies, current medications, and problem list.    Review of Systems:   Patient denies headache, fevers, malaise, unintentional weight loss, skin rash, eye pain, sinus congestion and sinus pain, sore throat, dysphagia,   hemoptysis , cough, dyspnea, wheezing, chest pain, palpitations, orthopnea, edema, abdominal pain, nausea, melena, diarrhea, constipation, flank pain, dysuria, hematuria, urinary  Frequency, nocturia, numbness, tingling, seizures,  Focal weakness, Loss of consciousness,  Tremor, insomnia, depression, anxiety, and suicidal ideation.     History   Social History  . Marital Status: Married    Spouse Name: N/A    Number of Children: N/A  . Years of Education: N/A   Occupational History  . Not on file.   Social History Main Topics  . Smoking status: Former Smoker    Types: Cigarettes    Quit date: 01/21/1986  . Smokeless tobacco: Never Used  . Alcohol Use: Yes     Comment: ocassional beer  . Drug Use: No  . Sexual Activity: Not on file   Other Topics Concern  . Not on file   Social History Narrative  . No narrative on file    Objective:  Filed Vitals:   09/10/13 1054  BP: 158/82  Pulse: 70  Resp: 14     General appearance: alert, cooperative and appears stated age Ears: normal TM's and external ear canals both ears Throat: lips, mucosa, and tongue normal; teeth and gums normal Neck: no adenopathy, no carotid bruit, supple, symmetrical, trachea midline and thyroid not enlarged, symmetric, no tenderness/mass/nodules Back: symmetric, no curvature. ROM normal. No CVA tenderness. Lungs: clear to auscultation bilaterally Heart: regular rate  and rhythm, S1, S2 normal, no murmur, click, rub or gallop Abdomen: soft, non-tender; bowel sounds normal; no masses,  no organomegaly Pulses: 2+ and symmetric Skin: Skin color, texture, turgor normal. No rashes or lesions Lymph nodes: Cervical, supraclavicular, and axillary nodes normal.  Assessment and Plan:  Hypertension Adding lisinopril  Today for BP not at goal  Fatty liver disease, nonalcoholic Recommending starting statin therapy and receiving hep A/hep b vaccines   Type II or unspecified type diabetes mellitus without  mention of complication, uncontrolled I have addressed  BMI and recommended wt loss of 10% of body weight over the next 6 months using a low glycemic index diet and regular exercise a minimum of 5 days per week.    Lab Results  Component Value Date   HGBA1C 6.1 09/07/2013     Obesity (BMI 30-39.9) I have addressed  BMI and recommended wt loss of 10% of body weigh over the next 6 months using a low glycemic index diet and regular exercise a minimum of 5 days per week.     Updated Medication List Outpatient Encounter Prescriptions as of 09/10/2013  Medication Sig  . Ascorbic Acid (C-500/ROSE HIPS PO) Take 1 tablet by mouth daily.  . Coenzyme Q10 (HM COQ10) 100 MG capsule Take 100 mg by mouth daily.  . fish oil-omega-3 fatty acids 1000 MG capsule Take 2 g by mouth daily.  . Garlic 1478 MG CAPS Take by mouth.  Marland Kitchen lisinopril (PRINIVIL,ZESTRIL) 5 MG tablet Take 1 tablet (5 mg total) by mouth daily.  . Misc Natural Products (TURMERIC CURCUMIN) CAPS Take 500 each by mouth daily.  . Multiple Vitamins-Minerals (MULTIVITAMIN WITH MINERALS) tablet Take 1 tablet by mouth daily.  . nizatidine (AXID) 300 MG capsule TAKE 1 CAPSULE BY MOUTH AT BEDTIME.  Marland Kitchen omeprazole (PRILOSEC) 20 MG capsule TAKE ONE CAPSULE BY MOUTH ONCE A DAY  . pravastatin (PRAVACHOL) 40 MG tablet TAKE 1 TABLET BY MOUTH EVERY EVENING  . vitamin E 400 UNIT capsule Take 800 Units by mouth daily.  . [DISCONTINUED] albuterol (PROVENTIL HFA;VENTOLIN HFA) 108 (90 BASE) MCG/ACT inhaler Inhale 2 puffs into the lungs every 6 (six) hours as needed for wheezing.  . [DISCONTINUED] ECHINACEA PO Take 760 mg by mouth daily.  . [DISCONTINUED] Korean Panax Ginseng 100 MG CAPS Take 1 capsule by mouth daily.  . [DISCONTINUED] meloxicam (MOBIC) 15 MG tablet Take 1 tablet (15 mg total) by mouth daily.  . [DISCONTINUED] omeprazole (PRILOSEC) 20 MG capsule TAKE ONE CAPSULE BY MOUTH TWICE A DAY     Orders Placed This Encounter  Procedures  . Hepatitis  A hepatitis B combined vaccine IM  . Comp Met (CMET)  . Ambulatory referral to Gastroenterology    Return in about 4 weeks (around 10/08/2013).

## 2013-09-13 NOTE — Assessment & Plan Note (Signed)
I have addressed  BMI and recommended wt loss of 10% of body weight over the next 6 months using a low glycemic index diet and regular exercise a minimum of 5 days per week.    Lab Results  Component Value Date   HGBA1C 6.1 09/07/2013

## 2013-09-13 NOTE — Assessment & Plan Note (Signed)
I have addressed  BMI and recommended wt loss of 10% of body weigh over the next 6 months using a low glycemic index diet and regular exercise a minimum of 5 days per week.   

## 2013-09-15 ENCOUNTER — Telehealth: Payer: Self-pay | Admitting: Internal Medicine

## 2013-09-15 NOTE — Telephone Encounter (Signed)
Dr. Darrick Grinder call regarding patient referral for colonic polyp wanted to know if this the only reason for referral if so Dr, Darrick Grinder stated patient is not due unless is for another reason. Dr.scarlett ask for you to page her at [phone] to discuss.

## 2013-09-15 NOTE — Telephone Encounter (Signed)
No,  Patient was not sure when his last colonoscopy had been done so if he is not due there is no need to waste her time.

## 2013-09-15 NOTE — Telephone Encounter (Signed)
Tried to reach Dr. Darrick Grinder left message regarding patient appointment.

## 2013-10-05 ENCOUNTER — Encounter: Payer: Self-pay | Admitting: Internal Medicine

## 2013-10-05 ENCOUNTER — Ambulatory Visit (INDEPENDENT_AMBULATORY_CARE_PROVIDER_SITE_OTHER): Payer: Medicare Other | Admitting: Internal Medicine

## 2013-10-05 VITALS — BP 132/80 | HR 70 | Temp 98.1°F | Resp 16 | Ht 69.0 in | Wt 243.0 lb

## 2013-10-05 DIAGNOSIS — E1165 Type 2 diabetes mellitus with hyperglycemia: Principal | ICD-10-CM

## 2013-10-05 DIAGNOSIS — IMO0001 Reserved for inherently not codable concepts without codable children: Secondary | ICD-10-CM

## 2013-10-05 DIAGNOSIS — Z23 Encounter for immunization: Secondary | ICD-10-CM | POA: Diagnosis not present

## 2013-10-05 DIAGNOSIS — K760 Fatty (change of) liver, not elsewhere classified: Secondary | ICD-10-CM

## 2013-10-05 DIAGNOSIS — I10 Essential (primary) hypertension: Secondary | ICD-10-CM | POA: Diagnosis not present

## 2013-10-05 DIAGNOSIS — K7689 Other specified diseases of liver: Secondary | ICD-10-CM | POA: Diagnosis not present

## 2013-10-05 MED ORDER — ATORVASTATIN CALCIUM 20 MG PO TABS: 20.0000 mg | ORAL_TABLET | Freq: Every day | ORAL | Status: DC

## 2013-10-05 NOTE — Patient Instructions (Addendum)
You have lost 3 lbs and your diet controlled Diabetes is better on your sugar free diet  [employer]'s pita bread is very low carb and high in omega 3 s and can be bought at  [facility] in the baker section and at [employer]    I want to switch your pravastatin to atorvastatin  When  you are done with your current bottle.    Return in 6 months for your last Hep A Hep B shot (your 2nd one was today)  And follow up on Diabetes and Hypertension  Diabetes Mellitus and Food It is important for you to manage your blood sugar (glucose) level. Your blood glucose level can be greatly affected by what you eat. Eating healthier foods in the appropriate amounts throughout the day at about the same time each day will help you control your blood glucose level. It can also help slow or prevent worsening of your diabetes mellitus. Healthy eating may even help you improve the level of your blood pressure and reach or maintain a healthy weight.  HOW CAN FOOD AFFECT ME? Carbohydrates Carbohydrates affect your blood glucose level more than any other type of food. Your dietitian will help you determine how many carbohydrates to eat at each meal and teach you how to count carbohydrates. Counting carbohydrates is important to keep your blood glucose at a healthy level, especially if you are using insulin or taking certain medicines for diabetes mellitus. Alcohol Alcohol can cause sudden decreases in blood glucose (hypoglycemia), especially if you use insulin or take certain medicines for diabetes mellitus. Hypoglycemia can be a life-threatening condition. Symptoms of hypoglycemia (sleepiness, dizziness, and disorientation) are similar to symptoms of having too much alcohol.  If your health care provider has given you approval to drink alcohol, do so in moderation and use the following guidelines:  Women should not have more than one drink per day, and men should not have more than two drinks per day. One drink is equal to:  12 oz of  beer.  5 oz of wine.  1 oz of hard liquor.  Do not drink on an empty stomach.  Keep yourself hydrated. Have water, diet soda, or unsweetened iced tea.  Regular soda, juice, and other mixers might contain a lot of carbohydrates and should be counted. WHAT FOODS ARE NOT RECOMMENDED? As you make food choices, it is important to remember that all foods are not the same. Some foods have fewer nutrients per serving than other foods, even though they might have the same number of calories or carbohydrates. It is difficult to get your body what it needs when you eat foods with fewer nutrients. Examples of foods that you should avoid that are high in calories and carbohydrates but low in nutrients include:  Trans fats (most processed foods list trans fats on the Nutrition Facts label).  Regular soda.  Juice.  Candy.  Sweets, such as cake, pie, doughnuts, and cookies.  Fried foods. WHAT FOODS CAN I EAT? Have nutrient-rich foods, which will nourish your body and keep you healthy. The food you should eat also will depend on several factors, including:  The calories you need.  The medicines you take.  Your weight.  Your blood glucose level.  Your blood pressure level.  Your cholesterol level. You also should eat a variety of foods, including:  Protein, such as meat, poultry, fish, tofu, nuts, and seeds (lean animal proteins are best).  Fruits.  Vegetables.  Dairy products, such as milk, cheese, and  yogurt (low fat is best).  Breads, grains, pasta, cereal, rice, and beans.  Fats such as olive oil, trans fat-free margarine, canola oil, avocado, and olives. DOES EVERYONE WITH DIABETES MELLITUS HAVE THE SAME MEAL PLAN? Because every person with diabetes mellitus is different, there is not one meal plan that works for everyone. It is very important that you meet with a dietitian who will help you create a meal plan that is just right for you. Document Released: 11/02/2004  Document Revised: 02/10/2013 Document Reviewed: 01/02/2013 [facility] Patient Information 2015 [place], [place]. This information is not intended to replace advice given to you by your health care provider. Make sure you discuss any questions you have with your health care provider.

## 2013-10-05 NOTE — Progress Notes (Signed)
Patient ID: Chad Gill, male   DOB: Aug 06, 1945, 68 y.o.   MRN: [id number]  Patient Active Problem List   Diagnosis Date Noted  . Uses hearing aid 09/10/2013  . Tinnitus of both ears 06/03/2013  . Bleeding hemorrhoid 03/13/2013  . Pain in joint, shoulder region 08/26/2012  . Type II or unspecified type diabetes mellitus without mention of complication, uncontrolled 08/26/2012  . Hypertension 01/30/2012  . Obesity (BMI 30-39.9) 07/24/2011  . GERD (gastroesophageal reflux disease)   . Hyperplastic colonic polyp   . Fatty liver disease, nonalcoholic     Subjective:  CC:   Chief Complaint  Patient presents with  . Follow-up  . Diabetes    HPI:   Chad Gill is a 68 y.o. male who presents for  4 week follow up on hypertension .  At last visit  Lisinopril was added for elevated blood pressure, and Hepatitis A/B vaccine was given.  He reports toleration of new medication, no orthostasis or cough.      Past Medical History  Diagnosis Date  . Barrett's esophagus 2004    last EGD 2007, no metaplasia, due in 2009  . Transaminitis   . Lumbar disc herniation 2007    L5  . Diabetes mellitus   . GERD (gastroesophageal reflux disease)   . Hyperplastic colonic polyp 2004  . Fatty liver disease, nonalcoholic     Past Surgical History  Procedure Laterality Date  . Tonsillectomy  age 6  . Small intestine surgery      carcinoid tumore, diagnosis disputed       The following portions of the patient's history were reviewed and updated as appropriate: Allergies, current medications, and problem list.    Review of Systems:   Patient denies headache, fevers, malaise, unintentional weight loss, skin rash, eye pain, sinus congestion and sinus pain, sore throat, dysphagia,  hemoptysis , cough, dyspnea, wheezing, chest pain, palpitations, orthopnea, edema, abdominal pain, nausea, melena, diarrhea, constipation, flank pain, dysuria, hematuria, urinary  Frequency, nocturia,  numbness, tingling, seizures,  Focal weakness, Loss of consciousness,  Tremor, insomnia, depression, anxiety, and suicidal ideation.     History   Social History  . Marital Status: Married    Spouse Name: N/A    Number of Children: N/A  . Years of Education: N/A   Occupational History  . Not on file.   Social History Main Topics  . Smoking status: Former Smoker    Types: Cigarettes    Quit date: 01/21/1986  . Smokeless tobacco: Never Used  . Alcohol Use: Yes     Comment: ocassional beer  . Drug Use: No  . Sexual Activity: Not on file   Other Topics Concern  . Not on file   Social History Narrative  . No narrative on file    Objective:  Filed Vitals:   10/05/13 1445  BP: 132/80  Pulse: 70  Temp: 98.1 F (36.7 C)  Resp: 16     General appearance: alert, cooperative and appears stated age Ears: normal TM's and external ear canals both ears Throat: lips, mucosa, and tongue normal; teeth and gums normal Neck: no adenopathy, no carotid bruit, supple, symmetrical, trachea midline and thyroid not enlarged, symmetric, no tenderness/mass/nodules Back: symmetric, no curvature. ROM normal. No CVA tenderness. Lungs: clear to auscultation bilaterally Heart: regular rate and rhythm, S1, S2 normal, no murmur, click, rub or gallop Abdomen: soft, non-tender; bowel sounds normal; no masses,  no organomegaly Pulses: 2+ and symmetric Skin: Skin color, texture,  turgor normal. No rashes or lesions Lymph nodes: Cervical, supraclavicular, and axillary nodes normal.  Assessment and Plan:  Essential hypertension, benign Improved controlled on current regimen. Renal function stable, no changes today.  Lab Results  Component Value Date   CREATININE 1.2 09/07/2013   Lab Results  Component Value Date   NA 138 09/07/2013   K 4.7 09/07/2013   CL 104 09/07/2013   CO2 27 09/07/2013     Fatty liver disease, nonalcoholic Continue  vaccination for Hepatitis A and B.    Updated  Medication List Outpatient Encounter Prescriptions as of 10/05/2013  Medication Sig  . Ascorbic Acid (C-500/ROSE HIPS PO) Take 1 tablet by mouth daily.  . Coenzyme Q10 (HM COQ10) 100 MG capsule Take 100 mg by mouth daily.  . fish oil-omega-3 fatty acids 1000 MG capsule Take 2 g by mouth daily.  . Garlic 3825 MG CAPS Take by mouth.  Marland Kitchen lisinopril (PRINIVIL,ZESTRIL) 5 MG tablet Take 1 tablet (5 mg total) by mouth daily.  . Misc Natural Products (TURMERIC CURCUMIN) CAPS Take 500 each by mouth daily.  . Multiple Vitamins-Minerals (MULTIVITAMIN WITH MINERALS) tablet Take 1 tablet by mouth daily.  . nizatidine (AXID) 300 MG capsule TAKE 1 CAPSULE BY MOUTH AT BEDTIME.  Marland Kitchen omeprazole (PRILOSEC) 20 MG capsule TAKE ONE CAPSULE BY MOUTH ONCE A DAY  . pravastatin (PRAVACHOL) 40 MG tablet TAKE 1 TABLET BY MOUTH EVERY EVENING  . vitamin E 400 UNIT capsule Take 800 Units by mouth daily.  Marland Kitchen atorvastatin (LIPITOR) 20 MG tablet Take 1 tablet (20 mg total) by mouth daily.     Orders Placed This Encounter  Procedures  . Hepatitis A hepatitis B combined vaccine IM  . HM DIABETES EYE EXAM    Return in about 6 months (around 04/07/2014) for follow up diabetes.

## 2013-10-05 NOTE — Progress Notes (Signed)
Pre-visit discussion using our clinic review tool. No additional management support is needed unless otherwise documented below in the visit note.  

## 2013-10-06 NOTE — Assessment & Plan Note (Signed)
Improved controlled on current regimen. Renal function stable, no changes today.  Lab Results  Component Value Date   CREATININE 1.2 09/07/2013   Lab Results  Component Value Date   NA 138 09/07/2013   K 4.7 09/07/2013   CL 104 09/07/2013   CO2 27 09/07/2013

## 2013-10-06 NOTE — Assessment & Plan Note (Signed)
Continue  vaccination for Hepatitis A and B.

## 2013-10-12 ENCOUNTER — Other Ambulatory Visit: Payer: 59

## 2013-10-22 ENCOUNTER — Ambulatory Visit: Payer: 59

## 2013-10-22 ENCOUNTER — Other Ambulatory Visit (INDEPENDENT_AMBULATORY_CARE_PROVIDER_SITE_OTHER): Payer: Medicare Other

## 2013-10-22 DIAGNOSIS — Z79899 Other long term (current) drug therapy: Secondary | ICD-10-CM

## 2013-10-22 LAB — COMPREHENSIVE METABOLIC PANEL
ALT: 58 U/L — AB (ref 0–53)
AST: 48 U/L — AB (ref 0–37)
Albumin: 4 g/dL (ref 3.5–5.2)
Alkaline Phosphatase: 47 U/L (ref 39–117)
BILIRUBIN TOTAL: 1.4 mg/dL — AB (ref 0.2–1.2)
BUN: 11 mg/dL (ref 6–23)
CHLORIDE: 102 meq/L (ref 96–112)
CO2: 30 mEq/L (ref 19–32)
CREATININE: 1.2 mg/dL (ref 0.4–1.5)
Calcium: 9 mg/dL (ref 8.4–10.5)
GFR: 63.85 mL/min (ref >60.00)
Glucose, Bld: 128 mg/dL — ABNORMAL HIGH (ref 70–99)
Potassium: 4.3 mEq/L (ref 3.5–5.1)
Sodium: 137 mEq/L (ref 135–145)
Total Protein: 6.9 g/dL (ref 6.0–8.3)

## 2013-10-23 ENCOUNTER — Encounter: Payer: Self-pay | Admitting: Internal Medicine

## 2013-10-28 ENCOUNTER — Other Ambulatory Visit: Payer: Self-pay | Admitting: Internal Medicine

## 2013-12-02 DIAGNOSIS — K760 Fatty (change of) liver, not elsewhere classified: Secondary | ICD-10-CM | POA: Diagnosis not present

## 2013-12-02 DIAGNOSIS — R7989 Other specified abnormal findings of blood chemistry: Secondary | ICD-10-CM | POA: Diagnosis not present

## 2013-12-02 DIAGNOSIS — Z01818 Encounter for other preprocedural examination: Secondary | ICD-10-CM | POA: Diagnosis not present

## 2014-02-08 ENCOUNTER — Other Ambulatory Visit: Payer: Self-pay | Admitting: *Deleted

## 2014-02-08 MED ORDER — NIZATIDINE 300 MG PO CAPS: ORAL_CAPSULE | ORAL | Status: DC

## 2014-02-16 ENCOUNTER — Encounter: Payer: Self-pay | Admitting: Nurse Practitioner

## 2014-02-16 ENCOUNTER — Ambulatory Visit (INDEPENDENT_AMBULATORY_CARE_PROVIDER_SITE_OTHER): Payer: Medicare Other | Admitting: Nurse Practitioner

## 2014-02-16 VITALS — BP 130/62 | HR 77 | Temp 98.2°F | Resp 12 | Ht 69.0 in | Wt 232.4 lb

## 2014-02-16 DIAGNOSIS — R05 Cough: Secondary | ICD-10-CM | POA: Diagnosis not present

## 2014-02-16 DIAGNOSIS — R059 Cough, unspecified: Secondary | ICD-10-CM

## 2014-02-16 MED ORDER — HYDROCOD POLST-CHLORPHEN POLST 10-8 MG/5ML PO LQCR: 5.0000 mL | Freq: Every evening | ORAL | Status: DC | PRN

## 2014-02-16 NOTE — Progress Notes (Signed)
Subjective:    Patient ID: Chad Gill, male    DOB: 1945-12-08, 68 y.o.   MRN: [id number]  HPI Chad Gill is a 68 yo male with a CC of cough.   1)Cough- x 7 days, not bad during day, coughing mostly at night time. White phlegm, PNDrip, rhinorrhea, sinus pressure, congestion and fatigue.  Treatment to date: Cough syrup Delsym- Not helpful  Halls cough drops- helpful during day Takes prilosec daily  Drinking water   Review of Systems  Constitutional: Positive for fatigue. Negative for fever, chills and diaphoresis.  HENT: Positive for congestion, postnasal drip, rhinorrhea and sinus pressure. Negative for ear discharge, ear pain, sneezing and sore throat.   Respiratory: Positive for cough. Negative for chest tightness, shortness of breath and wheezing.   Cardiovascular: Negative for chest pain, palpitations and leg swelling.  Gastrointestinal: Negative for nausea, vomiting and diarrhea.  Musculoskeletal: Negative for joint swelling.  Skin: Negative for rash.  Neurological: Negative for dizziness and headaches.   Past Medical History  Diagnosis Date  . Barrett's esophagus 2004    last EGD 2007, no metaplasia, due in 2009  . Transaminitis   . Lumbar disc herniation 2007    L5  . Diabetes mellitus   . GERD (gastroesophageal reflux disease)   . Hyperplastic colonic polyp 2004  . Fatty liver disease, nonalcoholic     History   Social History  . Marital Status: Married    Spouse Name: N/A    Number of Children: N/A  . Years of Education: N/A   Occupational History  . Not on file.   Social History Main Topics  . Smoking status: Former Smoker    Types: Cigarettes    Quit date: 01/21/1986  . Smokeless tobacco: Never Used  . Alcohol Use: Yes     Comment: ocassional beer  . Drug Use: No  . Sexual Activity: Not on file   Other Topics Concern  . Not on file   Social History Narrative    Past Surgical History  Procedure Laterality Date  . Tonsillectomy  age  33  . Small intestine surgery      carcinoid tumore, diagnosis disputed    Family History  Problem Relation Age of Onset  . Hypertension Mother     No Known Allergies  Current Outpatient Prescriptions on File Prior to Visit  Medication Sig Dispense Refill  . Ascorbic Acid (C-500/ROSE HIPS PO) Take 1 tablet by mouth daily.    Marland Kitchen atorvastatin (LIPITOR) 20 MG tablet Take 1 tablet (20 mg total) by mouth daily. 90 tablet 3  . Coenzyme Q10 (HM COQ10) 100 MG capsule Take 100 mg by mouth daily.    . fish oil-omega-3 fatty acids 1000 MG capsule Take 2 g by mouth daily.    . Garlic 6384 MG CAPS Take by mouth.    Marland Kitchen lisinopril (PRINIVIL,ZESTRIL) 5 MG tablet Take 1 tablet (5 mg total) by mouth daily. 90 tablet 3  . Multiple Vitamins-Minerals (MULTIVITAMIN WITH MINERALS) tablet Take 1 tablet by mouth daily.    . nizatidine (AXID) 300 MG capsule TAKE 1 CAPSULE BY MOUTH AT BEDTIME. 90 capsule 1  . omeprazole (PRILOSEC) 20 MG capsule TAKE ONE CAPSULE BY MOUTH ONCE A DAY    . omeprazole (PRILOSEC) 20 MG capsule TAKE ONE CAPSULE BY MOUTH TWICE A DAY 180 capsule 1   No current facility-administered medications on file prior to visit.       Objective:   Physical Exam  Constitutional:  He is oriented to person, place, and time. He appears well-developed and well-nourished. No distress.  HENT:  Head: Normocephalic and atraumatic.  Right Ear: External ear normal.  Left Ear: External ear normal.  Mouth/Throat: Oropharynx is clear and moist. No oropharyngeal exudate.  HA bilaterally, TMs clear bilaterally  Neck: Normal range of motion. Neck supple. No thyromegaly present.  Cardiovascular: Normal rate and regular rhythm.   Pulmonary/Chest: Effort normal and breath sounds normal.  Lymphadenopathy:    He has no cervical adenopathy.  Neurological: He is alert and oriented to person, place, and time.  Skin: Skin is warm and dry. No rash noted. He is not diaphoretic. No erythema. No pallor.  Psychiatric:  He has a normal mood and affect. His behavior is normal. Judgment and thought content normal.  Nursing note and vitals reviewed.  BP 130/62 mmHg  Pulse 77  Temp(Src) 98.2 F (36.8 C) (Oral)  Resp 12  Ht 5\' 9"  (1.753 m)  Wt 232 lb 6.4 oz (105.416 kg)  BMI 34.30 kg/m2  SpO2 96%     Assessment & Plan:

## 2014-02-16 NOTE — Progress Notes (Signed)
Pre visit review using our clinic review tool, if applicable. No additional management support is needed unless otherwise documented below in the visit note. 

## 2014-02-16 NOTE — Patient Instructions (Signed)
Call us if your cough worsens, fails to improve, or fever of 101 or greater.   Take cough syrup at bed time due to drowsy effects.   Continue to drink fluids and take prilosec.   Happy New Year!

## 2014-02-16 NOTE — Assessment & Plan Note (Signed)
Stable. Rx for Tussionex at bed time. Instructed pt that it will make him drowsy, continue taking omeprazole, drink lots of water, and to call us if worsens or fails to improve. FU prn.

## 2014-04-08 ENCOUNTER — Ambulatory Visit (INDEPENDENT_AMBULATORY_CARE_PROVIDER_SITE_OTHER)
Admission: RE | Admit: 2014-04-08 | Discharge: 2014-04-08 | Disposition: A | Discharge status: Home / Self Care | Payer: Medicare Other | Source: Ambulatory Visit | Attending: Internal Medicine | Admitting: Internal Medicine

## 2014-04-08 ENCOUNTER — Encounter: Payer: Self-pay | Admitting: Internal Medicine

## 2014-04-08 ENCOUNTER — Telehealth: Payer: Self-pay | Admitting: *Deleted

## 2014-04-08 ENCOUNTER — Other Ambulatory Visit (INDEPENDENT_AMBULATORY_CARE_PROVIDER_SITE_OTHER): Payer: Medicare Other

## 2014-04-08 ENCOUNTER — Ambulatory Visit (INDEPENDENT_AMBULATORY_CARE_PROVIDER_SITE_OTHER): Payer: Medicare Other | Admitting: Internal Medicine

## 2014-04-08 VITALS — BP 144/86 | HR 70 | Temp 98.2°F | Resp 16 | Ht 69.0 in | Wt 238.2 lb

## 2014-04-08 DIAGNOSIS — M79671 Pain in right foot: Secondary | ICD-10-CM

## 2014-04-08 DIAGNOSIS — E119 Type 2 diabetes mellitus without complications: Secondary | ICD-10-CM

## 2014-04-08 DIAGNOSIS — K2100 Gastro-esophageal reflux disease with esophagitis, without bleeding: Secondary | ICD-10-CM

## 2014-04-08 DIAGNOSIS — K760 Fatty (change of) liver, not elsewhere classified: Secondary | ICD-10-CM

## 2014-04-08 DIAGNOSIS — I10 Essential (primary) hypertension: Secondary | ICD-10-CM

## 2014-04-08 DIAGNOSIS — Z23 Encounter for immunization: Secondary | ICD-10-CM

## 2014-04-08 DIAGNOSIS — G8929 Other chronic pain: Secondary | ICD-10-CM

## 2014-04-08 DIAGNOSIS — K635 Polyp of colon: Secondary | ICD-10-CM | POA: Diagnosis not present

## 2014-04-08 DIAGNOSIS — M19071 Primary osteoarthritis, right ankle and foot: Secondary | ICD-10-CM | POA: Diagnosis not present

## 2014-04-08 DIAGNOSIS — E1169 Type 2 diabetes mellitus with other specified complication: Secondary | ICD-10-CM

## 2014-04-08 DIAGNOSIS — E669 Obesity, unspecified: Secondary | ICD-10-CM | POA: Diagnosis not present

## 2014-04-08 LAB — COMPREHENSIVE METABOLIC PANEL
ALT: 29 U/L (ref 0–53)
AST: 21 U/L (ref 0–37)
Albumin: 4.2 g/dL (ref 3.5–5.2)
Alkaline Phosphatase: 54 U/L (ref 39–117)
BUN: 12 mg/dL (ref 6–23)
CO2: 33 meq/L — AB (ref 19–32)
CREATININE: 1.18 mg/dL (ref 0.40–1.50)
Calcium: 9.3 mg/dL (ref 8.4–10.5)
Chloride: 100 mEq/L (ref 96–112)
GFR: 65.01 mL/min (ref >60.00)
GLUCOSE: 143 mg/dL — AB (ref 70–99)
Potassium: 4.2 mEq/L (ref 3.5–5.1)
Sodium: 137 mEq/L (ref 135–145)
TOTAL PROTEIN: 6.8 g/dL (ref 6.0–8.3)
Total Bilirubin: 1.2 mg/dL (ref 0.2–1.2)

## 2014-04-08 LAB — LIPID PANEL
CHOL/HDL RATIO: 3
Cholesterol: 119 mg/dL (ref 0–200)
HDL: 36.5 mg/dL — ABNORMAL LOW (ref >39.00)
LDL Cholesterol: 50 mg/dL (ref 0–99)
NONHDL: 82.5
TRIGLYCERIDES: 164 mg/dL — AB (ref 0.0–149.0)
VLDL: 32.8 mg/dL (ref 0.0–40.0)

## 2014-04-08 LAB — HEMOGLOBIN A1C: HEMOGLOBIN A1C: 6.1 % (ref 4.6–6.5)

## 2014-04-08 NOTE — Telephone Encounter (Signed)
Labs and dx?  

## 2014-04-08 NOTE — Patient Instructions (Signed)
Plain x rays of right heel to rule out bone spur,  Ok to take aleve or motrin as needed  I WANT YOU TO LOSE A MINIMUM OF 12 LBS BY YOUR NEXT 3 MONTH FOLLOW UP VISIT.  This is Dr. Lupita Dawn version of a  "Low GI"  Weight loss Diet.  It is appropriate for all patients with normal renal function , gluten tolerance, and advised for patients who have prediabetes or diabetes:   All of the foods can be found at grocery stores and in bulk at [employer].  The Atkins protein bars and shakes are available in more varieties at Target, WalMart and [employer].     7 AM Breakfast:  Choose from the following:  < 5 carbs  Weekdays: Low carbohydrate Protein  Shakes (EAS AdvantEdge "Carb  Control" shakes, Atkins,  Muscle Milk or Premier Protein shakes)     Weekends:  a scrambled egg/bacon/cheese burrito made with Mission's "carb  balance" whole wheat tortilla  (about 10 net carbs )  Eggs,  bacon /sausage , Joseph's pita /lavash bread or  (5 carbs)  A slice of fritatta ( egg based baked dish, no  crust:  google it) (< 10 carbs)   Avoid cereal and bananas, oatmeal and cream of wheat and grits. They are loaded with carbohydrates!   10 AM: high protein snack  (< 5 carbs)   Protein bar by Atkins  Or KIND  (the snack size, < 200 cal, usually < 6 carbs    A stick of cheese:  Around 1 carb,  100 cal      Other so called "protein bars" tend to be loaded with carbohydrates.  Remember, in food advertising, the word "energy" is synonymous for " carbohydrate."  Lunch:   A Sandwich using the bread choices listed, Can use any  Eggs,  lunchmeat, grilled meat or canned tuna).  Can add avocado, regular mayo/mustard  and cheese.  A Salad using blue cheese, ranch,  Goddess dressing  or vinagrette,  No croutons or "confetti" and no "candied nuts" but regular nuts OK.   2 HARD BOILED EGG WHITES AND A CUP OF one of these greek yogurts:    dannon lt n fit greek yogurt         chobani 100 greek yogurt,    Oikos triple zero greek  yogurt       No pretzels or chips.  Pickles and miniature sweet peppers are a good low  carb alternative that provide a "crunch"  The bread is the only source of carbohydrate in a sandwich and  can be decreased by trying some of these alternatives to traditional loaf bread:   Joseph's pita bread and Lavash (flat) bread :  50 cal and 4 net carbs  available at BJs and WalMart.  Taste better when toasted, use as pita chips  Toufayan makes a variety of  flatbreads and  A PITA POCKET.    LOOK FOR  THE ONES THAT ARE 17 NET CARBS OR LESS    Mission makes 2 sizes of  Low carb whole wheat tortillas  (The large one is  210 cal and 6 net carbs)   Avoid "Low fat dressings, as well as Barry Brunner and Garrison dressings    3 PM/ Mid day  Snack:  Consider  1 ounce of  almonds, walnuts, pistachios, pecans, peanuts,  Macadamia nuts or a nut medley that does not contain raisins or cranberries.  No "granola"; the dried cranberries  and raisins are loaded with carbohydrates. Mixed nuts as long as there are no raisins,  cranberries or dried fruit.    Try the prosciutto/mozzarella cheese sticks by [employer]  In deli /backery section   High protein   To avoid overindulging in snacks: Try drinking a glass of unsweeted almond/coconut milk  Or a cup of coffee with your Atkins chocolate bar to keep you from having 3!!!   Pork rinds!  Yes Pork Rinds are low carb potato chip substitute!   Toasted Joseph's flatbread with hummous dip (chickpeas)       6 PM  Dinner:     Meat/fowl/fish with a green salad, and either broccoli, cauliflower, green beans, spinach, brussel sprouts, bok choy or  Lima beans. Fried in canola oil /olive oil BUT DO NOT BREAD THE PROTEIN!!      There is a low carb pasta by Dreamfield's that is acceptable and tastes great: only 5 digestible carbs/serving.( All grocery stores but BJs carry it )  Prepared Meals:  Try Hurley Cisco Angelo's chicken piccata or chicken or eggplant parm over low carb pasta.(Lowes  and BJs)   Marjory Lies Sanchez's "Carnitas" (pulled pork, no sauce,  0 carbs) or his beef pot roast to make a dinner burrito (at [employer])  Barbecue with cole slaw is low carb BUT NO BUN!  SAME WITH HAMBURGERS     Whole wheat pasta is still full of digestible carbs and  Not as low in glycemic index as Dreamfield's.   Brown rice is still rice,  So skip the rice and noodles if you eat [place] or [place] (or at least limit to 1/2 cup)  9 PM snack :   Breyer's "low carb" fudgsicle or  ice cream bar (Carb Smart line), or  Weight  Watcher's ice cream bar , or another "no sugar added" ice cream;  a serving of fresh berries/cherries with whipped cream   Cheese or greek yogurt   8 ounces of Blue Diamond unsweetened almond/cococunut milk  Cheese and crackers (using WASA crackers,  They are low carb) or peanut butter on low carb crackers or pita bread     Avoid bananas, pineapple, grapes  and watermelon on a regular basis because they are high in sugar.  THINK OF THEM AS DESSERT and do not have daily   Remember that snack Substitutions should be less than 10 NET carbs per serving and meals should be < 20 net carbs. Remember that carbohydrates from fiber do not affect blood sugar, so you can  subtract fiber grams to get the "net carbs " of any particular food item.

## 2014-04-08 NOTE — Progress Notes (Signed)
Pre-visit discussion using our clinic review tool. No additional management support is needed unless otherwise documented below in the visit note.  

## 2014-04-08 NOTE — Progress Notes (Signed)
Patient ID: Chad Gill, male   DOB: 03-Jul-1945, 69 y.o.   MRN: [id number]   Patient Active Problem List   Diagnosis Date Noted  . Inflammatory pain of right heel 04/13/2014  . Cough 02/16/2014  . Uses hearing aid 09/10/2013  . Tinnitus of both ears 06/03/2013  . Bleeding hemorrhoid 03/13/2013  . Pain in joint, shoulder region 08/26/2012  . Diabetes mellitus type 2 in obese 08/26/2012  . Hypertension 01/30/2012  . Obesity (BMI 30-39.9) 07/24/2011  . GERD (gastroesophageal reflux disease)   . Hyperplastic colonic polyp   . Fatty liver disease, nonalcoholic     Subjective:  CC:   Chief Complaint  Patient presents with  . Follow-up  . Diabetes    HPI:   Chad Gill is a 69 y.o. male who presents for  Follow up on diet controlled DM, obesity, hypertension and hyperlipidemia,   He does not check blood sugars,  Has been following a low GI diet,  Exercising occasionally, but due to heel pain has  gained 6 lbs since last visit, but still dwn 5 from August.   Wt Readings from Last 3 Encounters:  04/08/14 238 lb 4 oz (108.069 kg)  02/16/14 232 lb 6.4 oz (105.416 kg)  10/05/13 243 lb (110.224 kg)   Had a viral Uri over the holidays,  Was treated with Tussionex for cough secondary to bronchitis   Right heel has been painful with weight bearing for the ;last 3 weeks,  No history of gout or trauma.    Due for follow up on colon polyps due,   Last colonoscopy 2010 polyps reported but no report in chart or link, thinks it was done at [facility]    Past Medical History  Diagnosis Date  . Barrett's esophagus 2004    last EGD 2007, no metaplasia, due in 2009  . Transaminitis   . Lumbar disc herniation 2007    L5  . Diabetes mellitus   . GERD (gastroesophageal reflux disease)   . Hyperplastic colonic polyp 2004  . Fatty liver disease, nonalcoholic     Past Surgical History  Procedure Laterality Date  . Tonsillectomy  age 19  . Small intestine surgery      carcinoid  tumore, diagnosis disputed       The following portions of the patient's history were reviewed and updated as appropriate: Allergies, current medications, and problem list.    Review of Systems:   Patient denies headache, fevers, malaise, unintentional weight loss, skin rash, eye pain, sinus congestion and sinus pain, sore throat, dysphagia,  hemoptysis , cough, dyspnea, wheezing, chest pain, palpitations, orthopnea, edema, abdominal pain, nausea, melena, diarrhea, constipation, flank pain, dysuria, hematuria, urinary  Frequency, nocturia, numbness, tingling, seizures,  Focal weakness, Loss of consciousness,  Tremor, insomnia, depression, anxiety, and suicidal ideation.     History   Social History  . Marital Status: Married    Spouse Name: N/A  . Number of Children: N/A  . Years of Education: N/A   Occupational History  . Not on file.   Social History Main Topics  . Smoking status: Former Smoker    Types: Cigarettes    Quit date: 01/21/1986  . Smokeless tobacco: Never Used  . Alcohol Use: Yes     Comment: ocassional beer  . Drug Use: No  . Sexual Activity: Not on file   Other Topics Concern  . Not on file   Social History Narrative    Objective:  Filed Vitals:  04/08/14 1004  BP: 144/86  Pulse: 70  Temp: 98.2 F (36.8 C)  Resp: 16     General appearance: alert, cooperative and appears stated age Ears: normal TM's and external ear canals both ears Throat: lips, mucosa, and tongue normal; teeth and gums normal Neck: no adenopathy, no carotid bruit, supple, symmetrical, trachea midline and thyroid not enlarged, symmetric, no tenderness/mass/nodules Back: symmetric, no curvature. ROM normal. No CVA tenderness. Lungs: clear to auscultation bilaterally Heart: regular rate and rhythm, S1, S2 normal, no murmur, click, rub or gallop Abdomen: soft, non-tender; bowel sounds normal; no masses,  no organomegaly Pulses: 2+ and symmetric Skin: Skin color, texture,  turgor normal. No rashes or lesions Lymph nodes: Cervical, supraclavicular, and axillary nodes normal.  Assessment and Plan:  Hypertension Lisinopril DOSE WAS INCREASED  today for elevated BP  Lab Results  Component Value Date   CREATININE 1.18 04/08/2014   Lab Results  Component Value Date   NA 137 04/08/2014   K 4.2 04/08/2014   CL 100 04/08/2014   CO2 33* 04/08/2014      Fatty liver disease, nonalcoholic  Current liver enzymes are normal and all modifiable risk factors including obesity, diabetes and hyperlipidemia have been addressed .  Vaccines for Hep A and B given  Lab Results  Component Value Date   ALT 29 04/08/2014   AST 21 04/08/2014   ALKPHOS 54 04/08/2014   BILITOT 1.2 04/08/2014     Diabetes mellitus type 2 in obese  Historically well-controlled on diet alone .  hemoglobin A1c has been consistently at or  less than 7.0 . Patient is up-to-date on eye exams and foot exam is normal today. Patient has no microalbuminuria. Patient is tolerating statin therapy for CAD risk reduction and on ACE/ARB for renal protection and hypertension  Lab Results  Component Value Date   HGBA1C 6.1 04/08/2014   Lab Results  Component Value Date   MICROALBUR 0.2 09/07/2013      Hyperplastic colonic polyp He is due for follow up on hyperplastic polyp found Oct 2010 in cecum,  [place].  Done at [facility]     GERD (gastroesophageal reflux disease) continue daily PPI for persistent inflammation seen on serial EGD's 2010, 2011,  And possible barrett's Esophagus ([facility])   Obesity (BMI 30-39.9) I have addressed  BMI and recommended wt loss of 10% of body weigh over the next 6 months using a low glycemic index diet and regular exercise a minimum of 5 days per week.     Inflammatory pain of right heel Secondary to degenerative changes and heel spurring.  By plain films.  Advised to add gel pad to shoe, podiatry referral if no improvement    A total of 25 minutes of face to  face time was spent with patient more than half of which was spent in counselling about the above mentioned conditions  and coordination of care   Updated Medication List Outpatient Encounter Prescriptions as of 04/08/2014  Medication Sig  . Ascorbic Acid (C-500/ROSE HIPS PO) Take 1 tablet by mouth daily.  Marland Kitchen atorvastatin (LIPITOR) 20 MG tablet Take 1 tablet (20 mg total) by mouth daily.  . Coenzyme Q10 (HM COQ10) 100 MG capsule Take 100 mg by mouth daily.  . fish oil-omega-3 fatty acids 1000 MG capsule Take 2 g by mouth daily.  . Garlic 5573 MG CAPS Take by mouth.  Marland Kitchen lisinopril (PRINIVIL,ZESTRIL) 10 MG tablet Take 1 tablet (10 mg total) by mouth daily.  . Multiple  Vitamins-Minerals (MULTIVITAMIN WITH MINERALS) tablet Take 1 tablet by mouth daily.  . nizatidine (AXID) 300 MG capsule TAKE 1 CAPSULE BY MOUTH AT BEDTIME.  Marland Kitchen omeprazole (PRILOSEC) 20 MG capsule TAKE ONE CAPSULE BY MOUTH ONCE A DAY  . [DISCONTINUED] lisinopril (PRINIVIL,ZESTRIL) 5 MG tablet Take 1 tablet (5 mg total) by mouth daily.  . chlorpheniramine-HYDROcodone (TUSSIONEX) 10-8 MG/5ML LQCR Take 5 mLs by mouth at bedtime as needed for cough. (Patient not taking: Reported on 04/08/2014)  . [DISCONTINUED] omeprazole (PRILOSEC) 20 MG capsule TAKE ONE CAPSULE BY MOUTH TWICE A DAY     Orders Placed This Encounter  Procedures  . DG Foot Complete Right  . Hepatitis A hepatitis B combined vaccine IM  . Ambulatory referral to Gastroenterology    Return in about 3 months (around 07/07/2014).

## 2014-04-13 ENCOUNTER — Encounter: Payer: Self-pay | Admitting: Internal Medicine

## 2014-04-13 ENCOUNTER — Telehealth: Payer: Self-pay | Admitting: Internal Medicine

## 2014-04-13 DIAGNOSIS — M79671 Pain in right foot: Secondary | ICD-10-CM | POA: Insufficient documentation

## 2014-04-13 MED ORDER — LISINOPRIL 10 MG PO TABS: 10.0000 mg | ORAL_TABLET | Freq: Every day | ORAL | Status: DC

## 2014-04-13 NOTE — Assessment & Plan Note (Signed)
Current liver enzymes are normal and all modifiable risk factors including obesity, diabetes and hyperlipidemia have been addressed .  Vaccines for Hep A and B given  Lab Results  Component Value Date   ALT 29 04/08/2014   AST 21 04/08/2014   ALKPHOS 54 04/08/2014   BILITOT 1.2 04/08/2014

## 2014-04-13 NOTE — Assessment & Plan Note (Addendum)
Lisinopril DOSE WAS INCREASED  today for elevated BP  Lab Results  Component Value Date   CREATININE 1.18 04/08/2014   Lab Results  Component Value Date   NA 137 04/08/2014   K 4.2 04/08/2014   CL 100 04/08/2014   CO2 33* 04/08/2014

## 2014-04-13 NOTE — Assessment & Plan Note (Signed)
Secondary to degenerative changes and heel spurring.  By plain films.  Advised to add gel pad to shoe, podiatry referral if no improvement

## 2014-04-13 NOTE — Assessment & Plan Note (Addendum)
He is due for follow up on hyperplastic polyp found Oct 2010 in cecum,  [place].  Done at [facility]

## 2014-04-13 NOTE — Assessment & Plan Note (Signed)
I have addressed  BMI and recommended wt loss of 10% of body weigh over the next 6 months using a low glycemic index diet and regular exercise a minimum of 5 days per week.   

## 2014-04-13 NOTE — Telephone Encounter (Signed)
Mychart

## 2014-04-13 NOTE — Assessment & Plan Note (Signed)
Historically well-controlled on diet alone .  hemoglobin A1c has been consistently at or  less than 7.0 . Patient is up-to-date on eye exams and foot exam is normal today. Patient has no microalbuminuria. Patient is tolerating statin therapy for CAD risk reduction and on ACE/ARB for renal protection and hypertension  Lab Results  Component Value Date   HGBA1C 6.1 04/08/2014   Lab Results  Component Value Date   MICROALBUR 0.2 09/07/2013

## 2014-04-13 NOTE — Assessment & Plan Note (Addendum)
continue daily PPI for persistent inflammation seen on serial EGD's 2010, 2011,  And possible barrett's Esophagus ([facility])

## 2014-04-14 ENCOUNTER — Telehealth: Payer: Self-pay | Admitting: Internal Medicine

## 2014-04-14 NOTE — Telephone Encounter (Signed)
Verified patient dosage of lisinopril and notified patient that 10 mg is correct.

## 2014-04-26 ENCOUNTER — Telehealth: Payer: Self-pay | Admitting: Internal Medicine

## 2014-04-26 DIAGNOSIS — M25579 Pain in unspecified ankle and joints of unspecified foot: Secondary | ICD-10-CM

## 2014-04-26 NOTE — Telephone Encounter (Signed)
The patient found a podiatrist in [place] that he would like to be referred to Dr. Dora Sims for his bone spur. Phone # [phone]  fax [phone].

## 2014-04-27 NOTE — Telephone Encounter (Signed)
Bonnita Nasuti , the podiatrist is external and referral is specific,  See details of who the referral is for

## 2014-05-10 DIAGNOSIS — M722 Plantar fascial fibromatosis: Secondary | ICD-10-CM | POA: Diagnosis not present

## 2014-05-10 DIAGNOSIS — M79671 Pain in right foot: Secondary | ICD-10-CM | POA: Diagnosis not present

## 2014-06-10 ENCOUNTER — Encounter: Payer: Self-pay | Admitting: Internal Medicine

## 2014-06-30 DIAGNOSIS — K760 Fatty (change of) liver, not elsewhere classified: Secondary | ICD-10-CM | POA: Diagnosis not present

## 2014-07-07 ENCOUNTER — Other Ambulatory Visit: Payer: Self-pay | Admitting: Internal Medicine

## 2014-07-08 ENCOUNTER — Encounter: Payer: Self-pay | Admitting: Internal Medicine

## 2014-07-08 ENCOUNTER — Ambulatory Visit (INDEPENDENT_AMBULATORY_CARE_PROVIDER_SITE_OTHER): Payer: Medicare Other | Admitting: Internal Medicine

## 2014-07-08 VITALS — BP 138/68 | HR 73 | Temp 98.4°F | Resp 16 | Ht 69.0 in | Wt 238.4 lb

## 2014-07-08 DIAGNOSIS — E119 Type 2 diabetes mellitus without complications: Secondary | ICD-10-CM

## 2014-07-08 DIAGNOSIS — E669 Obesity, unspecified: Secondary | ICD-10-CM

## 2014-07-08 DIAGNOSIS — I10 Essential (primary) hypertension: Secondary | ICD-10-CM

## 2014-07-08 DIAGNOSIS — E1169 Type 2 diabetes mellitus with other specified complication: Secondary | ICD-10-CM

## 2014-07-08 NOTE — Progress Notes (Signed)
Subjective:  Patient ID: Chad Gill, male    DOB: 1945-05-15  Age: 69 y.o. MRN: [id number]  CC: The primary encounter diagnosis was Diabetes mellitus type 2 in obese. Diagnoses of Essential hypertension and Obesity (BMI 30-39.9) were also pertinent to this visit.  HPI Chad Gill presents for  Follow up on DM type 2 with obesity, hepatic steatosis. hypertension and hyperlipidemia.  He feels generally well, is walking several times per week and checking blood sugars once daily at variable times.  BS have been under 130 fasting and < 150 post prandially.  Denies any recent hypoglyemic events.  Taking his medications as directed. Following a carbohydrate modified diet 6 days per week. Denies numbness, burning and tingling of extremities. Appetite is good.     Colonoscopy next month at [place],  [place] exam last year.  foot exam done today   Outpatient Prescriptions Prior to Visit  Medication Sig Dispense Refill  . atorvastatin (LIPITOR) 20 MG tablet Take 1 tablet (20 mg total) by mouth daily. 90 tablet 3  . Coenzyme Q10 (HM COQ10) 100 MG capsule Take 100 mg by mouth daily.    . fish oil-omega-3 fatty acids 1000 MG capsule Take 2 g by mouth daily.    . Garlic 8527 MG CAPS Take by mouth.    Marland Kitchen lisinopril (PRINIVIL,ZESTRIL) 10 MG tablet TAKE 1 TABLET BY MOUTH ONCE DAILY 90 tablet 1  . Multiple Vitamins-Minerals (MULTIVITAMIN WITH MINERALS) tablet Take 1 tablet by mouth daily.    . nizatidine (AXID) 300 MG capsule TAKE 1 CAPSULE BY MOUTH AT BEDTIME. 90 capsule 1  . omeprazole (PRILOSEC) 20 MG capsule TAKE ONE CAPSULE BY MOUTH ONCE A DAY    . Ascorbic Acid (C-500/ROSE HIPS PO) Take 1 tablet by mouth daily.    . chlorpheniramine-HYDROcodone (TUSSIONEX) 10-8 MG/5ML LQCR Take 5 mLs by mouth at bedtime as needed for cough. (Patient not taking: Reported on 04/08/2014) 115 mL 0   No facility-administered medications prior to visit.    Review of Systems;  Patient denies headache, fevers,  malaise, unintentional weight loss, skin rash, eye pain, sinus congestion and sinus pain, sore throat, dysphagia,  hemoptysis , cough, dyspnea, wheezing, chest pain, palpitations, orthopnea, edema, abdominal pain, nausea, melena, diarrhea, constipation, flank pain, dysuria, hematuria, urinary  Frequency, nocturia, numbness, tingling, seizures,  Focal weakness, Loss of consciousness,  Tremor, insomnia, depression, anxiety, and suicidal ideation.      Objective:  BP 138/68 mmHg  Pulse 73  Temp(Src) 98.4 F (36.9 C) (Oral)  Resp 16  Ht 5\' 9"  (1.753 m)  Wt 238 lb 6 oz (108.126 kg)  BMI 35.19 kg/m2  SpO2 96%  BP Readings from Last 3 Encounters:  07/08/14 138/68  04/08/14 144/86  02/16/14 130/62    Wt Readings from Last 3 Encounters:  07/08/14 238 lb 6 oz (108.126 kg)  04/08/14 238 lb 4 oz (108.069 kg)  02/16/14 232 lb 6.4 oz (105.416 kg)   General appearance: alert, cooperative and appears stated age Ears: normal TM's and external ear canals both ears Throat: lips, mucosa, and tongue normal; teeth and gums normal Neck: no adenopathy, no carotid bruit, supple, symmetrical, trachea midline and thyroid not enlarged, symmetric, no tenderness/mass/nodules Back: symmetric, no curvature. ROM normal. No CVA tenderness. Lungs: clear to auscultation bilaterally Heart: regular rate and rhythm, S1, S2 normal, no murmur, click, rub or gallop Abdomen: soft, non-tender; bowel sounds normal; no masses,  no organomegaly Pulses: 2+ and symmetric Skin: Skin color, texture, turgor  normal. No rashes or lesions Lymph nodes: Cervical, supraclavicular, and axillary nodes normal.  Lab Results  Component Value Date   HGBA1C 6.1 04/08/2014   HGBA1C 6.1 09/07/2013   HGBA1C 6.3 06/03/2013    Lab Results  Component Value Date   CREATININE 1.18 04/08/2014   CREATININE 1.2 10/22/2013   CREATININE 1.2 09/07/2013    Lab Results  Component Value Date   GLUCOSE 143* 04/08/2014   CHOL 119 04/08/2014    TRIG 164.0* 04/08/2014   HDL 36.50* 04/08/2014   LDLCALC 50 04/08/2014   ALT 29 04/08/2014   AST 21 04/08/2014   NA 137 04/08/2014   K 4.2 04/08/2014   CL 100 04/08/2014   CREATININE 1.18 04/08/2014   BUN 12 04/08/2014   CO2 33* 04/08/2014   HGBA1C 6.1 04/08/2014   MICROALBUR 0.2 09/07/2013    Dg Foot Complete Right  04/08/2014   CLINICAL DATA:  Heel pain with weight-bearing, no trauma  EXAM: RIGHT FOOT COMPLETE - 3+ VIEW  COMPARISON:  None.  FINDINGS: Tarsal metatarsal alignment is normal. There is mild degenerative change involving the right first MTP joint with some loss of joint space. No erosion is seen. On the lateral view there are small degenerative spurs emanating from the plantar aspect of the calcaneus as well as the insertion of the Achilles tendon.  IMPRESSION: Small degenerative calcaneal spurs. Mild degenerative change of the right first MTP joint.   Electronically Signed   By: Ivar Drape M.D.   On: 04/08/2014 15:21    Assessment & Plan:   Problem List Items Addressed This Visit    Obesity (BMI 30-39.9)    I have addressed  BMI and recommended wt loss of 10% of body weigh over the next 6 months using a low glycemic index diet and regular exercise a minimum of 5 days per week.        Hypertension    Well controlled on current regimen. Renal function stable, no changes today..  Lab Results  Component Value Date   CREATININE 1.18 04/08/2014   Lab Results  Component Value Date   NA 137 04/08/2014   K 4.2 04/08/2014   CL 100 04/08/2014   CO2 33* 04/08/2014         Diabetes mellitus type 2 in obese - Primary     Historically well-controlled on diet alone .  hemoglobin A1c has been consistently at or  less than 7.0 . Patient is up-to-date on eye exams and foot exam is normal today. Patient has no microalbuminuria. Patient is tolerating statin therapy for CAD risk reduction and on ACE/ARB for renal protection and hypertension  Lab Results  Component Value Date     HGBA1C 6.1 04/08/2014   Lab Results  Component Value Date   MICROALBUR 0.2 09/07/2013              I am having Mr. Sear maintain his fish oil-omega-3 fatty acids, Garlic, Coenzyme Z61, multivitamin with minerals, Ascorbic Acid (C-500/ROSE HIPS PO), omeprazole, atorvastatin, nizatidine, chlorpheniramine-HYDROcodone, and lisinopril.  No orders of the defined types were placed in this encounter.    There are no discontinued medications.  Follow-up: Return in about 3 months (around 10/08/2014).   Crecencio Mc, MD

## 2014-07-08 NOTE — Progress Notes (Signed)
Pre-visit discussion using our clinic review tool. No additional management support is needed unless otherwise documented below in the visit note.  

## 2014-07-08 NOTE — Patient Instructions (Addendum)
Fasting labs and A1c same day of appt in 3 months   I want you to lose 12 lbs by next appt ,  Your next appt will be the medicare wellness exam

## 2014-07-10 ENCOUNTER — Encounter: Payer: Self-pay | Admitting: Internal Medicine

## 2014-07-10 NOTE — Assessment & Plan Note (Signed)
Well controlled on current regimen. Renal function stable, no changes today..  Lab Results  Component Value Date   CREATININE 1.18 04/08/2014   Lab Results  Component Value Date   NA 137 04/08/2014   K 4.2 04/08/2014   CL 100 04/08/2014   CO2 33* 04/08/2014

## 2014-07-10 NOTE — Assessment & Plan Note (Signed)
Historically well-controlled on diet alone .  hemoglobin A1c has been consistently at or  less than 7.0 . Patient is up-to-date on eye exams and foot exam is normal today. Patient has no microalbuminuria. Patient is tolerating statin therapy for CAD risk reduction and on ACE/ARB for renal protection and hypertension  Lab Results  Component Value Date   HGBA1C 6.1 04/08/2014   Lab Results  Component Value Date   MICROALBUR 0.2 09/07/2013

## 2014-07-10 NOTE — Assessment & Plan Note (Signed)
I have addressed  BMI and recommended wt loss of 10% of body weigh over the next 6 months using a low glycemic index diet and regular exercise a minimum of 5 days per week.   

## 2014-08-02 ENCOUNTER — Other Ambulatory Visit: Payer: Self-pay | Admitting: *Deleted

## 2014-08-02 DIAGNOSIS — K635 Polyp of colon: Secondary | ICD-10-CM | POA: Diagnosis not present

## 2014-08-02 DIAGNOSIS — D127 Benign neoplasm of rectosigmoid junction: Secondary | ICD-10-CM | POA: Diagnosis not present

## 2014-08-02 DIAGNOSIS — I10 Essential (primary) hypertension: Secondary | ICD-10-CM | POA: Diagnosis not present

## 2014-08-02 DIAGNOSIS — D124 Benign neoplasm of descending colon: Secondary | ICD-10-CM | POA: Diagnosis not present

## 2014-08-02 DIAGNOSIS — D128 Benign neoplasm of rectum: Secondary | ICD-10-CM | POA: Diagnosis not present

## 2014-08-02 DIAGNOSIS — Z79899 Other long term (current) drug therapy: Secondary | ICD-10-CM | POA: Diagnosis not present

## 2014-08-02 DIAGNOSIS — Z86010 Personal history of colonic polyps: Secondary | ICD-10-CM | POA: Diagnosis not present

## 2014-08-02 DIAGNOSIS — D125 Benign neoplasm of sigmoid colon: Secondary | ICD-10-CM | POA: Diagnosis not present

## 2014-08-02 DIAGNOSIS — D120 Benign neoplasm of cecum: Secondary | ICD-10-CM | POA: Diagnosis not present

## 2014-08-02 DIAGNOSIS — K648 Other hemorrhoids: Secondary | ICD-10-CM | POA: Diagnosis not present

## 2014-08-02 DIAGNOSIS — K219 Gastro-esophageal reflux disease without esophagitis: Secondary | ICD-10-CM | POA: Diagnosis not present

## 2014-08-02 DIAGNOSIS — D123 Benign neoplasm of transverse colon: Secondary | ICD-10-CM | POA: Diagnosis not present

## 2014-08-02 DIAGNOSIS — D122 Benign neoplasm of ascending colon: Secondary | ICD-10-CM | POA: Diagnosis not present

## 2014-08-02 DIAGNOSIS — Z1211 Encounter for screening for malignant neoplasm of colon: Secondary | ICD-10-CM | POA: Diagnosis not present

## 2014-08-02 LAB — HM COLONOSCOPY: HM Colonoscopy: 913

## 2014-08-02 MED ORDER — NIZATIDINE 300 MG PO CAPS: ORAL_CAPSULE | ORAL | Status: DC

## 2014-08-11 DIAGNOSIS — R161 Splenomegaly, not elsewhere classified: Secondary | ICD-10-CM | POA: Diagnosis not present

## 2014-08-11 DIAGNOSIS — K760 Fatty (change of) liver, not elsewhere classified: Secondary | ICD-10-CM | POA: Diagnosis not present

## 2014-08-23 ENCOUNTER — Encounter: Payer: Self-pay | Admitting: Internal Medicine

## 2014-09-17 ENCOUNTER — Other Ambulatory Visit: Payer: Self-pay | Admitting: Internal Medicine

## 2014-10-11 ENCOUNTER — Encounter: Payer: Self-pay | Admitting: Internal Medicine

## 2014-10-11 ENCOUNTER — Ambulatory Visit (INDEPENDENT_AMBULATORY_CARE_PROVIDER_SITE_OTHER): Payer: Medicare Other | Admitting: Internal Medicine

## 2014-10-11 VITALS — BP 126/70 | HR 66 | Temp 97.9°F | Resp 14 | Ht 69.75 in | Wt 236.5 lb

## 2014-10-11 DIAGNOSIS — Z125 Encounter for screening for malignant neoplasm of prostate: Secondary | ICD-10-CM

## 2014-10-11 DIAGNOSIS — E1169 Type 2 diabetes mellitus with other specified complication: Secondary | ICD-10-CM

## 2014-10-11 DIAGNOSIS — K760 Fatty (change of) liver, not elsewhere classified: Secondary | ICD-10-CM

## 2014-10-11 DIAGNOSIS — Z0000 Encounter for general adult medical examination without abnormal findings: Secondary | ICD-10-CM | POA: Diagnosis not present

## 2014-10-11 DIAGNOSIS — E669 Obesity, unspecified: Secondary | ICD-10-CM

## 2014-10-11 DIAGNOSIS — I10 Essential (primary) hypertension: Secondary | ICD-10-CM

## 2014-10-11 DIAGNOSIS — E1159 Type 2 diabetes mellitus with other circulatory complications: Secondary | ICD-10-CM

## 2014-10-11 DIAGNOSIS — Z1159 Encounter for screening for other viral diseases: Secondary | ICD-10-CM | POA: Diagnosis not present

## 2014-10-11 DIAGNOSIS — E119 Type 2 diabetes mellitus without complications: Secondary | ICD-10-CM

## 2014-10-11 LAB — COMPREHENSIVE METABOLIC PANEL
ALBUMIN: 4.4 g/dL (ref 3.5–5.2)
ALT: 29 U/L (ref 0–53)
AST: 21 U/L (ref 0–37)
Alkaline Phosphatase: 51 U/L (ref 39–117)
BUN: 13 mg/dL (ref 6–23)
CO2: 32 mEq/L (ref 19–32)
CREATININE: 1.02 mg/dL (ref 0.40–1.50)
Calcium: 9.4 mg/dL (ref 8.4–10.5)
Chloride: 100 mEq/L (ref 96–112)
GFR: 76.8 mL/min (ref >60.00)
GLUCOSE: 127 mg/dL — AB (ref 70–99)
POTASSIUM: 4.7 meq/L (ref 3.5–5.1)
SODIUM: 137 meq/L (ref 135–145)
Total Bilirubin: 1.1 mg/dL (ref 0.2–1.2)
Total Protein: 6.7 g/dL (ref 6.0–8.3)

## 2014-10-11 LAB — HEMOGLOBIN A1C: HEMOGLOBIN A1C: 6.1 % (ref 4.6–6.5)

## 2014-10-11 LAB — LIPID PANEL
CHOL/HDL RATIO: 3
CHOLESTEROL: 112 mg/dL (ref 0–200)
HDL: 35.4 mg/dL — ABNORMAL LOW (ref >39.00)
LDL CALC: 53 mg/dL (ref 0–99)
NonHDL: 76.17
Triglycerides: 114 mg/dL (ref 0.0–149.0)
VLDL: 22.8 mg/dL (ref 0.0–40.0)

## 2014-10-11 LAB — LDL CHOLESTEROL, DIRECT: LDL DIRECT: 57 mg/dL

## 2014-10-11 LAB — PSA, MEDICARE: PSA: 1.23 ng/mL (ref 0.10–4.00)

## 2014-10-11 LAB — MICROALBUMIN / CREATININE URINE RATIO
Creatinine,U: 124.3 mg/dL
Microalb Creat Ratio: 0.6 mg/g (ref 0.0–30.0)
Microalb, Ur: <0.7 mg/dL (ref 0.0–1.9)

## 2014-10-11 NOTE — Progress Notes (Signed)
Patient ID: Chad Gill, male    DOB: 07-11-1945  Age: 69 y.o. MRN: [id number]  The patient is here for annual Medicare wellness examination and management of other chronic and acute problems.   The risk factors are reflected in the social history.  The roster of all physicians providing medical care to patient - is listed in the Snapshot section of the chart.  Activities of daily living:  The patient is 100% independent in all ADLs: dressing, toileting, feeding as well as independent mobility  Home safety : The patient has smoke detectors in the home. They wear seatbelts.  There are no firearms at home. There is no violence in the home.   There is no risks for hepatitis, STDs or HIV. There is no   history of blood transfusion. They have no travel history to infectious disease endemic areas of the world.  The patient has seen their dentist in the last six month. They have seen their eye doctor in the last year. They wear bilateral hearing aids due to [place] related .  They do not  have excessive sun exposure. Discussed the need for sun protection: hats, long sleeves and use of sunscreen if there is significant sun exposure.   Diet: the importance of a healthy diet is discussed. They do have a healthy diet.  The benefits of regular aerobic exercise were discussed. She walks 4 times per week ,  20 minutes.   Depression screen: there are no signs or vegative symptoms of depression- irritability, change in appetite, anhedonia, sadness/tearfullness.  Cognitive assessment: the patient manages all their financial and personal affairs and is actively engaged. They could relate day,date,year and events; recalled 2/3 objects at 3 minutes; performed clock-face test normally.  The following portions of the patient's history were reviewed and updated as appropriate: allergies, current medications, past family history, past medical history,  past surgical history, past social history  and problem  list.  Visual acuity was not assessed per patient preference since she has regular follow up with her ophthalmologist. Hearing and body mass index were assessed and reviewed.   During the course of the visit the patient was educated and counseled about appropriate screening and preventive services including : fall prevention , diabetes screening, nutrition counseling, colorectal cancer screening, and recommended immunizations.    CC: The primary encounter diagnosis was Need for hepatitis C screening test. Diagnoses of Obesity, diabetes, and hypertension syndrome, Prostate cancer screening, Diabetes mellitus type 2 in obese, Essential hypertension, Fatty liver disease, nonalcoholic, and Medicare annual wellness visit, subsequent were also pertinent to this visit.  Follow up on diabetes , hypertension and obesity with fatty  liver.  He feels generally well, is exercising several times per week and checking blood sugars once daily at variable times.  BS have been under 130 fasting and < 150 post prandially.  Denies any recent hypoglyemic events.  Taking his medications as directed. Following a carbohydrate modified diet 6 days per week. Denies numbness, burning and tingling of extremities. Appetite is good.    History Chad Gill has a past medical history of Barrett's esophagus (2004); Transaminitis; Lumbar disc herniation (2007); Diabetes mellitus; GERD (gastroesophageal reflux disease); Hyperplastic colonic polyp (2004); and Fatty liver disease, nonalcoholic.   He has past surgical history that includes Tonsillectomy (age 3) and Small intestine surgery.   His family history includes Hypertension in his mother.He reports that he quit smoking about 28 years ago. His smoking use included Cigarettes. He has never used smokeless tobacco.  He reports that he drinks alcohol. He reports that he does not use illicit drugs.  Outpatient Prescriptions Prior to Visit  Medication Sig Dispense Refill  . Ascorbic  Acid (C-500/ROSE HIPS PO) Take 1 tablet by mouth daily.    Marland Kitchen atorvastatin (LIPITOR) 20 MG tablet TAKE 1 TABLET BY MOUTH DAILY. 90 tablet 1  . Coenzyme Q10 (HM COQ10) 100 MG capsule Take 100 mg by mouth daily.    . Garlic 0034 MG CAPS Take by mouth.    Marland Kitchen lisinopril (PRINIVIL,ZESTRIL) 10 MG tablet TAKE 1 TABLET BY MOUTH ONCE DAILY 90 tablet 1  . Multiple Vitamins-Minerals (MULTIVITAMIN WITH MINERALS) tablet Take 1 tablet by mouth daily.    . nizatidine (AXID) 300 MG capsule TAKE 1 CAPSULE BY MOUTH AT BEDTIME. 90 capsule 1  . chlorpheniramine-HYDROcodone (TUSSIONEX) 10-8 MG/5ML LQCR Take 5 mLs by mouth at bedtime as needed for cough. (Patient not taking: Reported on 04/08/2014) 115 mL 0  . fish oil-omega-3 fatty acids 1000 MG capsule Take 2 g by mouth daily.    Marland Kitchen omeprazole (PRILOSEC) 20 MG capsule TAKE ONE CAPSULE BY MOUTH ONCE A DAY     No facility-administered medications prior to visit.    Review of Systems   Patient denies headache, fevers, malaise, unintentional weight loss, skin rash, eye pain, sinus congestion and sinus pain, sore throat, dysphagia,  hemoptysis , cough, dyspnea, wheezing, chest pain, palpitations, orthopnea, edema, abdominal pain, nausea, melena, diarrhea, constipation, flank pain, dysuria, hematuria, urinary  Frequency, nocturia, numbness, tingling, seizures,  Focal weakness, Loss of consciousness,  Tremor, insomnia, depression, anxiety, and suicidal ideation.      Objective:  BP 126/70 mmHg  Pulse 66  Temp(Src) 97.9 F (36.6 C) (Oral)  Resp 14  Ht 5' 9.75" (1.772 m)  Wt 236 lb 8 oz (107.276 kg)  BMI 34.16 kg/m2  SpO2 95%  Physical Exam   General appearance: alert, cooperative and appears stated age Ears: normal TM's and external ear canals both ears Throat: lips, mucosa, and tongue normal; teeth and gums normal Neck: no adenopathy, no carotid bruit, supple, symmetrical, trachea midline and thyroid not enlarged, symmetric, no tenderness/mass/nodules Back:  symmetric, no curvature. ROM normal. No CVA tenderness. Lungs: clear to auscultation bilaterally Heart: regular rate and rhythm, S1, S2 normal, no murmur, click, rub or gallop Abdomen: soft, non-tender; bowel sounds normal; no masses,  no organomegaly Pulses: 2+ and symmetric Skin: Skin color, texture, turgor normal. No rashes or lesions Lymph nodes: Cervical, supraclavicular, and axillary nodes normal.    Assessment & Plan:   Problem List Items Addressed This Visit      Unprioritized   Fatty liver disease, nonalcoholic     Current liver enzymes are normal and all modifiable risk factors including obesity, diabetes and hyperlipidemia have been addressed .  Vaccines for Hep A and B given  Lab Results  Component Value Date   ALT 29 10/11/2014   AST 21 10/11/2014   ALKPHOS 51 10/11/2014   BILITOT 1.1 10/11/2014          Hypertension    Well controlled on current regimen. Renal function stable, no changes today. Lab Results  Component Value Date   CREATININE 1.02 10/11/2014   Lab Results  Component Value Date   NA 137 10/11/2014   K 4.7 10/11/2014   CL 100 10/11/2014   CO2 32 10/11/2014         Diabetes mellitus type 2 in obese     Historically well-controlled on diet alone .  hemoglobin A1c has been consistently at or  less than 7.0 . Patient is up-to-date on eye exams and foot exam is normal today. Patient has no microalbuminuria. Patient is tolerating statin therapy for CAD risk reduction and on ACE/ARB for renal protection and hypertension  Lab Results  Component Value Date   HGBA1C 6.1 10/11/2014   Lab Results  Component Value Date   MICROALBUR <0.7 10/11/2014             Medicare annual wellness visit, subsequent    Annual Medicare wellness  exam was done as well as a comprehensive physical exam and management of acute and chronic conditions .  During the course of the visit the patient was educated and counseled about appropriate screening and  preventive services including : fall prevention , diabetes screening, nutrition counseling, colorectal cancer screening, and recommended immunizations.  Printed recommendations for health maintenance screenings was given.        Other Visit Diagnoses    Need for hepatitis C screening test    -  Primary    Relevant Orders    HIV antibody (Completed)    Hepatitis C antibody (Completed)    Obesity, diabetes, and hypertension syndrome        Relevant Orders    Comprehensive metabolic panel (Completed)    Hemoglobin A1c (Completed)    Microalbumin / creatinine urine ratio (Completed)    Lipid panel (Completed)    LDL cholesterol, direct (Completed)    Prostate cancer screening        Relevant Orders    PSA, Medicare (Completed)       I have discontinued Mr. Conely fish oil-omega-3 fatty acids, omeprazole, and chlorpheniramine-HYDROcodone. I am also having him maintain his Garlic, Coenzyme K48, multivitamin with minerals, Ascorbic Acid (C-500/ROSE HIPS PO), lisinopril, nizatidine, and atorvastatin.  No orders of the defined types were placed in this encounter.    Medications Discontinued During This Encounter  Medication Reason  . chlorpheniramine-HYDROcodone (TUSSIONEX) 10-8 MG/5ML LQCR Completed Course  . fish oil-omega-3 fatty acids 1000 MG capsule Completed Course  . omeprazole (PRILOSEC) 20 MG capsule Error    Follow-up: Return in about 6 months (around 04/13/2015) for follow up diabetes.   Crecencio Mc, MD

## 2014-10-11 NOTE — Patient Instructions (Signed)
You should stop the lisinorpil since it is causing a cough  If your home BP is > 130/80 without it,  Let me know and I will prescribe losartan instead  Health Maintenance A healthy lifestyle and preventative care can promote health and wellness.  Maintain regular health, dental, and eye exams.  Eat a healthy diet. Foods like vegetables, fruits, whole grains, low-fat dairy products, and lean protein foods contain the nutrients you need and are low in calories. Decrease your intake of foods high in solid fats, added sugars, and salt. Get information about a proper diet from your health care provider, if necessary.  Regular physical exercise is one of the most important things you can do for your health. Most adults should get at least 150 minutes of moderate-intensity exercise (any activity that increases your heart rate and causes you to sweat) each week. In addition, most adults need muscle-strengthening exercises on 2 or more days a week.   Maintain a healthy weight. The body mass index (BMI) is a screening tool to identify possible weight problems. It provides an estimate of body fat based on height and weight. Your health care provider can find your BMI and can help you achieve or maintain a healthy weight. For males 20 years and older:  A BMI below 18.5 is considered underweight.  A BMI of 18.5 to 24.9 is normal.  A BMI of 25 to 29.9 is considered overweight.  A BMI of 30 and above is considered obese.  Maintain normal blood lipids and cholesterol by exercising and minimizing your intake of saturated fat. Eat a balanced diet with plenty of fruits and vegetables. Blood tests for lipids and cholesterol should begin at age 68 and be repeated every 5 years. If your lipid or cholesterol levels are high, you are over age 59, or you are at high risk for heart disease, you may need your cholesterol levels checked more frequently.Ongoing high lipid and cholesterol levels should be treated with  medicines if diet and exercise are not working.  If you smoke, find out from your health care provider how to quit. If you do not use tobacco, do not start.  Lung cancer screening is recommended for adults aged 69-80 years who are at high risk for developing lung cancer because of a history of smoking. A yearly low-dose CT scan of the lungs is recommended for people who have at least a 30-pack-year history of smoking and are current smokers or have quit within the past 15 years. A pack year of smoking is smoking an average of 1 pack of cigarettes a day for 1 year (for example, a 30-pack-year history of smoking could mean smoking 1 pack a day for 30 years or 2 packs a day for 15 years). Yearly screening should continue until the smoker has stopped smoking for at least 15 years. Yearly screening should be stopped for people who develop a health problem that would prevent them from having lung cancer treatment.  If you choose to drink alcohol, do not have more than 2 drinks per day. One drink is considered to be 12 oz (360 mL) of beer, 5 oz (150 mL) of wine, or 1.5 oz (45 mL) of liquor.  Avoid the use of street drugs. Do not share needles with anyone. Ask for help if you need support or instructions about stopping the use of drugs.  High blood pressure causes heart disease and increases the risk of stroke. Blood pressure should be checked at least every  1-2 years. Ongoing high blood pressure should be treated with medicines if weight loss and exercise are not effective.  If you are 67-28 years old, ask your health care provider if you should take aspirin to prevent heart disease.  Diabetes screening involves taking a blood sample to check your fasting blood sugar level. This should be done once every 3 years after age 53 if you are at a normal weight and without risk factors for diabetes. Testing should be considered at a younger age or be carried out more frequently if you are overweight and have at  least 1 risk factor for diabetes.  Colorectal cancer can be detected and often prevented. Most routine colorectal cancer screening begins at the age of 65 and continues through age 71. However, your health care provider may recommend screening at an earlier age if you have risk factors for colon cancer. On a yearly basis, your health care provider may provide home test kits to check for hidden blood in the stool. A small camera at the end of a tube may be used to directly examine the colon (sigmoidoscopy or colonoscopy) to detect the earliest forms of colorectal cancer. Talk to your health care provider about this at age 52 when routine screening begins. A direct exam of the colon should be repeated every 5-10 years through age 23, unless early forms of precancerous polyps or small growths are found.  People who are at an increased risk for hepatitis B should be screened for this virus. You are considered at high risk for hepatitis B if:  You were born in a country where hepatitis B occurs often. Talk with your health care provider about which countries are considered high risk.  Your parents were born in a high-risk country and you have not received a shot to protect against hepatitis B (hepatitis B vaccine).  You have HIV or AIDS.  You use needles to inject street drugs.  You live with, or have sex with, someone who has hepatitis B.  You are a man who has sex with other men (MSM).  You get hemodialysis treatment.  You take certain medicines for conditions like cancer, organ transplantation, and autoimmune conditions.  Hepatitis C blood testing is recommended for all people born from [birth year] through 1966 and any individual with known risk factors for hepatitis C.  Healthy men should no longer receive prostate-specific antigen (PSA) blood tests as part of routine cancer screening. Talk to your health care provider about prostate cancer screening.  Testicular cancer screening is not recommended  for adolescents or adult males who have no symptoms. Screening includes self-exam, a health care provider exam, and other screening tests. Consult with your health care provider about any symptoms you have or any concerns you have about testicular cancer.  Practice safe sex. Use condoms and avoid high-risk sexual practices to reduce the spread of sexually transmitted infections (STIs).  You should be screened for STIs, including gonorrhea and chlamydia if:  You are sexually active and are younger than 24 years.  You are older than 24 years, and your health care provider tells you that you are at risk for this type of infection.  Your sexual activity has changed since you were last screened, and you are at an increased risk for chlamydia or gonorrhea. Ask your health care provider if you are at risk.  If you are at risk of being infected with HIV, it is recommended that you take a prescription medicine daily to prevent HIV  infection. This is called pre-exposure prophylaxis (PrEP). You are considered at risk if:  You are a man who has sex with other men (MSM).  You are a heterosexual man who is sexually active with multiple partners.  You take drugs by injection.  You are sexually active with a partner who has HIV.  Talk with your health care provider about whether you are at high risk of being infected with HIV. If you choose to begin PrEP, you should first be tested for HIV. You should then be tested every 3 months for as long as you are taking PrEP.  Use sunscreen. Apply sunscreen liberally and repeatedly throughout the day. You should seek shade when your shadow is shorter than you. Protect yourself by wearing long sleeves, pants, a wide-brimmed hat, and sunglasses year round whenever you are outdoors.  Tell your health care provider of new moles or changes in moles, especially if there is a change in shape or color. Also, tell your health care provider if a mole is larger than the size  of a pencil eraser.  A one-time screening for abdominal aortic aneurysm (AAA) and surgical repair of large AAAs by ultrasound is recommended for men aged 27-75 years who are current or former smokers.  Stay current with your vaccines (immunizations). Document Released: 08/04/2007 Document Revised: 02/10/2013 Document Reviewed: 07/03/2010 [facility] Patient Information 2015 [place], [place]. This information is not intended to replace advice given to you by your health care provider. Make sure you discuss any questions you have with your health care provider.

## 2014-10-11 NOTE — Progress Notes (Signed)
Pre-visit discussion using our clinic review tool. No additional management support is needed unless otherwise documented below in the visit note.  

## 2014-10-12 DIAGNOSIS — Z0000 Encounter for general adult medical examination without abnormal findings: Secondary | ICD-10-CM | POA: Insufficient documentation

## 2014-10-12 LAB — HEPATITIS C ANTIBODY: HCV Ab: NEGATIVE

## 2014-10-12 LAB — HIV ANTIBODY (ROUTINE TESTING W REFLEX): HIV 1&2 Ab, 4th Generation: NONREACTIVE

## 2014-10-12 NOTE — Assessment & Plan Note (Signed)
Current liver enzymes are normal and all modifiable risk factors including obesity, diabetes and hyperlipidemia have been addressed .  Vaccines for Hep A and B given  Lab Results  Component Value Date   ALT 29 10/11/2014   AST 21 10/11/2014   ALKPHOS 51 10/11/2014   BILITOT 1.1 10/11/2014

## 2014-10-12 NOTE — Assessment & Plan Note (Signed)

## 2014-10-12 NOTE — Assessment & Plan Note (Signed)
Historically well-controlled on diet alone .  hemoglobin A1c has been consistently at or  less than 7.0 . Patient is up-to-date on eye exams and foot exam is normal today. Patient has no microalbuminuria. Patient is tolerating statin therapy for CAD risk reduction and on ACE/ARB for renal protection and hypertension  Lab Results  Component Value Date   HGBA1C 6.1 10/11/2014   Lab Results  Component Value Date   MICROALBUR <0.7 10/11/2014

## 2014-10-12 NOTE — Assessment & Plan Note (Signed)
Well controlled on current regimen. Renal function stable, no changes today. Lab Results  Component Value Date   CREATININE 1.02 10/11/2014   Lab Results  Component Value Date   NA 137 10/11/2014   K 4.7 10/11/2014   CL 100 10/11/2014   CO2 32 10/11/2014

## 2014-10-13 ENCOUNTER — Encounter: Payer: Self-pay | Admitting: Internal Medicine

## 2014-11-08 LAB — HM COLONOSCOPY

## 2015-01-17 DIAGNOSIS — K7460 Unspecified cirrhosis of liver: Secondary | ICD-10-CM | POA: Insufficient documentation

## 2015-01-17 DIAGNOSIS — K760 Fatty (change of) liver, not elsewhere classified: Secondary | ICD-10-CM | POA: Insufficient documentation

## 2015-01-28 ENCOUNTER — Other Ambulatory Visit: Payer: Self-pay | Admitting: Internal Medicine

## 2015-02-01 ENCOUNTER — Ambulatory Visit: Payer: Medicare Other | Admitting: Family Medicine

## 2015-02-02 ENCOUNTER — Ambulatory Visit (INDEPENDENT_AMBULATORY_CARE_PROVIDER_SITE_OTHER): Payer: Medicare Other | Admitting: Family Medicine

## 2015-02-02 ENCOUNTER — Encounter: Payer: Self-pay | Admitting: Family Medicine

## 2015-02-02 VITALS — BP 148/82 | HR 80 | Temp 98.2°F | Ht 69.0 in | Wt 237.4 lb

## 2015-02-02 DIAGNOSIS — R05 Cough: Secondary | ICD-10-CM | POA: Diagnosis not present

## 2015-02-02 DIAGNOSIS — R059 Cough, unspecified: Secondary | ICD-10-CM | POA: Insufficient documentation

## 2015-02-02 MED ORDER — HYDROCOD POLST-CPM POLST ER 10-8 MG/5ML PO SUER: 5.0000 mL | Freq: Two times a day (BID) | ORAL | Status: DC | PRN

## 2015-02-02 NOTE — Assessment & Plan Note (Signed)
New problem. Likely viral in origin. Treating with Tussionex.

## 2015-02-02 NOTE — Patient Instructions (Addendum)
Take the medication at night for your cough.  Be aware that it may make you drowsy.  Follow up if you fail to improve or worsen.  Take care  Dr. Lacinda Axon

## 2015-02-02 NOTE — Progress Notes (Addendum)
   Subjective:  Patient ID: Chad Gill, male    DOB: [birth year]  Age: 69 y.o. MRN: [id number]  CC: Cough  HPI:  69 year old male with a past medical history of hypertension, GERD, and DM-2 resents for acute visit with complaints of cough.  Cough  Has been present x 2 days.  Cough is mildly productive of discolored sputum.  No associated fevers or chills.  No associated shortness of breath.  Office particularly worse at night. It seems to be improved during the day.  No known exacerbating factors (other than it is worse at night).  No interventions/meds tried.   No other complaints today.  Social Hx   Social History   Social History  . Marital Status: Married    Spouse Name: N/A  . Number of Children: N/A  . Years of Education: N/A   Social History Main Topics  . Smoking status: Former Smoker    Types: Cigarettes    Quit date: 01/21/1986  . Smokeless tobacco: Never Used  . Alcohol Use: Yes     Comment: ocassional beer  . Drug Use: No  . Sexual Activity: Not Asked   Other Topics Concern  . None   Social History Narrative   Review of Systems  Constitutional: Negative for fever and chills.  Respiratory: Positive for cough.    Objective:  BP 148/82 mmHg  Pulse 80  Temp(Src) 98.2 F (36.8 C) (Oral)  Ht 5\' 9"  (1.753 m)  Wt 237 lb 6 oz (107.673 kg)  BMI 35.04 kg/m2  SpO2 95%  BP/Weight 02/02/2015 10/11/2014 [id number]  Systolic BP 123456 [id number]  Diastolic BP 82 70 68  Wt. (Lbs) 237.38 236.5 238.38  BMI 35.04 34.16 35.19   Physical Exam  Constitutional: No distress.  HENT:  Head: Normocephalic and atraumatic.  Right Ear: External ear normal.  Left Ear: External ear normal.  Normal TMs bilaterally. Oropharynx with mild erythema.  Neck: Neck supple.  Cardiovascular: Normal rate and regular rhythm.   Murmur heard.  Systolic murmur is present with a grade of 2/6  Pulmonary/Chest: Effort normal and breath sounds normal. No respiratory distress.  He has no wheezes. He has no rales.  Lymphadenopathy:    He has no cervical adenopathy.  Neurological: He is alert.  Vitals reviewed.  Lab Results  Component Value Date   GLUCOSE 127* 10/11/2014   CHOL 112 10/11/2014   TRIG 114.0 10/11/2014   HDL 35.40* 10/11/2014   LDLDIRECT 57.0 10/11/2014   LDLCALC 53 10/11/2014   ALT 29 10/11/2014   AST 21 10/11/2014   NA 137 10/11/2014   K 4.7 10/11/2014   CL 100 10/11/2014   CREATININE 1.02 10/11/2014   BUN 13 10/11/2014   CO2 32 10/11/2014   PSA 1.23 10/11/2014   HGBA1C 6.1 10/11/2014   MICROALBUR <0.7 10/11/2014   Assessment & Plan:   Problem List Items Addressed This Visit    Cough - Primary    New problem. Likely viral in origin. Treating with Tussionex.          Meds ordered this encounter  Medications  . chlorpheniramine-HYDROcodone (TUSSIONEX PENNKINETIC ER) 10-8 MG/5ML SUER    Sig: Take 5 mLs by mouth every 12 (twelve) hours as needed.    Dispense:  115 mL    Refill:  0    Follow-up: Return if symptoms worsen or fail to improve.  [place]

## 2015-02-02 NOTE — Addendum Note (Signed)
Addended by: Coral Spikes on: 02/02/2015 03:35 PM   Modules accepted: Level of Service

## 2015-02-02 NOTE — Progress Notes (Signed)
Pre visit review using our clinic review tool, if applicable. No additional management support is needed unless otherwise documented below in the visit note. 

## 2015-02-02 NOTE — Addendum Note (Signed)
Addended by: Coral Spikes on: 02/02/2015 03:47 PM   Modules accepted: Level of Service

## 2015-02-16 ENCOUNTER — Encounter: Payer: Self-pay | Admitting: *Deleted

## 2015-02-18 DIAGNOSIS — K760 Fatty (change of) liver, not elsewhere classified: Secondary | ICD-10-CM | POA: Diagnosis not present

## 2015-02-18 DIAGNOSIS — K769 Liver disease, unspecified: Secondary | ICD-10-CM | POA: Diagnosis not present

## 2015-02-18 DIAGNOSIS — K7460 Unspecified cirrhosis of liver: Secondary | ICD-10-CM | POA: Diagnosis not present

## 2015-03-11 ENCOUNTER — Other Ambulatory Visit: Payer: Self-pay | Admitting: Internal Medicine

## 2015-04-13 ENCOUNTER — Ambulatory Visit (INDEPENDENT_AMBULATORY_CARE_PROVIDER_SITE_OTHER): Payer: Medicare Other | Admitting: Internal Medicine

## 2015-04-13 ENCOUNTER — Encounter: Payer: Self-pay | Admitting: Internal Medicine

## 2015-04-13 VITALS — BP 162/94 | HR 71 | Temp 98.2°F | Ht 69.0 in | Wt 238.5 lb

## 2015-04-13 DIAGNOSIS — E119 Type 2 diabetes mellitus without complications: Secondary | ICD-10-CM | POA: Diagnosis not present

## 2015-04-13 DIAGNOSIS — E785 Hyperlipidemia, unspecified: Secondary | ICD-10-CM | POA: Diagnosis not present

## 2015-04-13 DIAGNOSIS — K635 Polyp of colon: Secondary | ICD-10-CM | POA: Diagnosis not present

## 2015-04-13 DIAGNOSIS — E1169 Type 2 diabetes mellitus with other specified complication: Secondary | ICD-10-CM

## 2015-04-13 DIAGNOSIS — K760 Fatty (change of) liver, not elsewhere classified: Secondary | ICD-10-CM

## 2015-04-13 DIAGNOSIS — E669 Obesity, unspecified: Secondary | ICD-10-CM | POA: Diagnosis not present

## 2015-04-13 DIAGNOSIS — I10 Essential (primary) hypertension: Secondary | ICD-10-CM

## 2015-04-13 LAB — HEMOGLOBIN A1C: Hgb A1c MFr Bld: 6.6 % — ABNORMAL HIGH (ref 4.6–6.5)

## 2015-04-13 LAB — COMPREHENSIVE METABOLIC PANEL
ALBUMIN: 4.6 g/dL (ref 3.5–5.2)
ALT: 36 U/L (ref 0–53)
AST: 24 U/L (ref 0–37)
Alkaline Phosphatase: 62 U/L (ref 39–117)
BUN: 11 mg/dL (ref 6–23)
CHLORIDE: 102 meq/L (ref 96–112)
CO2: 27 meq/L (ref 19–32)
Calcium: 9.2 mg/dL (ref 8.4–10.5)
Creatinine, Ser: 1.06 mg/dL (ref 0.40–1.50)
GFR: 73.36 mL/min (ref >60.00)
GLUCOSE: 182 mg/dL — AB (ref 70–99)
POTASSIUM: 4 meq/L (ref 3.5–5.1)
SODIUM: 137 meq/L (ref 135–145)
Total Bilirubin: 1.5 mg/dL — ABNORMAL HIGH (ref 0.2–1.2)
Total Protein: 6.9 g/dL (ref 6.0–8.3)

## 2015-04-13 LAB — LIPID PANEL
CHOL/HDL RATIO: 3
Cholesterol: 119 mg/dL (ref 0–200)
HDL: 35.7 mg/dL — ABNORMAL LOW (ref >39.00)
LDL CALC: 54 mg/dL (ref 0–99)
NONHDL: 83.5
Triglycerides: 146 mg/dL (ref 0.0–149.0)
VLDL: 29.2 mg/dL (ref 0.0–40.0)

## 2015-04-13 LAB — LDL CHOLESTEROL, DIRECT: Direct LDL: 61 mg/dL

## 2015-04-13 MED ORDER — LISINOPRIL 20 MG PO TABS: 20.0000 mg | ORAL_TABLET | Freq: Every day | ORAL | Status: DC

## 2015-04-13 NOTE — Patient Instructions (Addendum)
I am increasing the lisinopril for YOUR blood pressure to 20 mg daily in the morning  Please Call if your blood pressure is not < 130/80 most days  By week 2 on the higher lisinopril dose     2) Your BMI is 35.  To get your BMI < 30 you need to weigh 200 .  Stay on a low carb diet.   Be careful about rice and noodles'  They are starches (LIMIT TO ONE SERVING OF STARCH TO ONE DAILY)  We've got to find some substitutions for your potatoes!!  Try the mashed cauliflower and riced cauliflower dishes instead of rice and mashed potatoes  Mashed turnips are also very low carb!   Try [employer]ikos Triple Zero [place] Yogurt in the salted caramel, and the coffee flavors  With Whipped Cream for dessert   3) [facility] ordered and MRI of your liver because they saw multiple "nodules" on your ultrasounds and they cannot rule out cancer without the MRI  Plesae call them back and request and "open" MRI so you don't have a panic attack

## 2015-04-13 NOTE — Progress Notes (Addendum)
Subjective:  Patient ID: Chad Gill, male    DOB: [birth year]  Age: 70 y.o. MRN: [id number]  CC: The primary encounter diagnosis was Diabetes mellitus without complication ([place]). Diagnoses of Obesity (BMI 30-39.9), Fatty liver disease, nonalcoholic, Hyperplastic colonic polyp, Diabetes mellitus type 2 in obese ([place]), Hyperlipidemia associated with type 2 diabetes mellitus ([place]), and Essential hypertension were also pertinent to this visit.  HPI Chad Gill presents for 6 month follow up on diabetes and hyperlipidemia.  Patient has no complaints today.  Patient is following a low glycemic index diet and  Has begun a walking program 3 times weekly.   He does not check blood sugars or take medications for diabetes,  But is taking lisinopril and atorvastatin regularly without side effects. .  Patient has had an eye exam in the last 12 months and checks feet regularly for signs of infection.  Patient does not walk barefoot outside,  And denies any numbness tingling or burning in feet. Patient is up to date on all recommended vaccinations  .Hypertension: patient checks blood pressure twice weekly at home.  Readings have been labile at home even  at rest . Patient is following a reduce salt diet most days and is taking medications as prescribed   systolics up to [id number]   Fatty liver:  [facility]  has ordered an MRI abdomen  due to mulitple liver nodules noted on ultrasound,  Discussed with patient today as did not understand why it was ordered and felt that the imaging studies were excessive.  He has had a fibroscan and ultrasound.  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  He ha snot requested an open MRI    Lab Results  Component Value Date   HGBA1C 6.6* 04/13/2015    Outpatient Prescriptions Prior to Visit  Medication Sig Dispense Refill  . Ascorbic Acid (C-500/ROSE HIPS PO) Take 1 tablet by mouth daily.    Marland Kitchen atorvastatin (LIPITOR)  20 MG tablet TAKE 1 TABLET BY MOUTH EVERY DAY 90 tablet 3  . chlorpheniramine-HYDROcodone (TUSSIONEX PENNKINETIC ER) 10-8 MG/5ML SUER Take 5 mLs by mouth every 12 (twelve) hours as needed. 115 mL 0  . Coenzyme Q10 (HM COQ10) 100 MG capsule Take 100 mg by mouth daily.    . Garlic 123XX123 MG CAPS Take by mouth.    . Multiple Vitamins-Minerals (MULTIVITAMIN WITH MINERALS) tablet Take 1 tablet by mouth daily.    . nizatidine (AXID) 300 MG capsule TAKE 1 CAPSULE BY MOUTH AT BEDTIME. 90 capsule 1  . omeprazole (PRILOSEC) 20 MG capsule Take by mouth.    Marland Kitchen lisinopril (PRINIVIL,ZESTRIL) 10 MG tablet TAKE 1 TABLET BY MOUTH ONCE DAILY 90 tablet 1   No facility-administered medications prior to visit.    Review of Systems;  Patient denies headache, fevers, malaise, unintentional weight loss, skin rash, eye pain, sinus congestion and sinus pain, sore throat, dysphagia,  hemoptysis , cough, dyspnea, wheezing, chest pain, palpitations, orthopnea, edema, abdominal pain, nausea, melena, diarrhea, constipation, flank pain, dysuria, hematuria, urinary  Frequency, nocturia, numbness, tingling, seizures,  Focal weakness, Loss of consciousness,  Tremor, insomnia, depression, anxiety, and suicidal ideation.      Objective:  BP 162/94 mmHg  Pulse 71  Temp(Src) 98.2 F (36.8 C) (Oral)  Ht 5\' 9"  (1.753 m)  Wt 238 lb 8 oz (108.183 kg)  BMI 35.20 kg/m2  SpO2 94%  BP Readings from Last 3 Encounters:  04/13/15 162/94  02/02/15 148/82  10/11/14 126/70    Wt Readings from Last 3 Encounters:  04/13/15 238 lb 8 oz (108.183 kg)  02/02/15 237 lb 6 oz (107.673 kg)  10/11/14 236 lb 8 oz (107.276 kg)    General appearance: alert, cooperative and appears stated age Ears: normal TM's and external ear canals both ears Throat: lips, mucosa, and tongue normal; teeth and gums normal Neck: no adenopathy, no carotid bruit, supple, symmetrical, trachea midline and thyroid not enlarged, symmetric, no  tenderness/mass/nodules Back: symmetric, no curvature. ROM normal. No CVA tenderness. Lungs: clear to auscultation bilaterally Heart: regular rate and rhythm, S1, S2 normal, no murmur, click, rub or gallop Abdomen: soft, non-tender; bowel sounds normal; no masses,  no organomegaly Pulses: 2+ and symmetric Skin: Skin color, texture, turgor normal. No rashes or lesions Lymph nodes: Cervical, supraclavicular, and axillary nodes normal.  Lab Results  Component Value Date   HGBA1C 6.6* 04/13/2015   HGBA1C 6.1 10/11/2014   HGBA1C 6.1 04/08/2014    Lab Results  Component Value Date   CREATININE 1.06 04/13/2015   CREATININE 1.02 10/11/2014   CREATININE 1.18 04/08/2014    Lab Results  Component Value Date   GLUCOSE 182* 04/13/2015   CHOL 119 04/13/2015   TRIG 146.0 04/13/2015   HDL 35.70* 04/13/2015   LDLDIRECT 61.0 04/13/2015   LDLCALC 54 04/13/2015   ALT 36 04/13/2015   AST 24 04/13/2015   NA 137 04/13/2015   K 4.0 04/13/2015   CL 102 04/13/2015   CREATININE 1.06 04/13/2015   BUN 11 04/13/2015   CO2 27 04/13/2015   PSA 1.23 10/11/2014   HGBA1C 6.6* 04/13/2015   MICROALBUR <0.7 10/11/2014    Dg Foot Complete Right  04/08/2014  CLINICAL DATA:  Heel pain with weight-bearing, no trauma EXAM: RIGHT FOOT COMPLETE - 3+ VIEW COMPARISON:  None. FINDINGS: Tarsal metatarsal alignment is normal. There is mild degenerative change involving the right first MTP joint with some loss of joint space. No erosion is seen. On the lateral view there are small degenerative spurs emanating from the plantar aspect of the calcaneus as well as the insertion of the Achilles tendon. IMPRESSION: Small degenerative calcaneal spurs. Mild degenerative change of the right first MTP joint. Electronically Signed   By: Ivar Drape M.D.   On: 04/08/2014 15:21    Assessment & Plan:   Problem List Items Addressed This Visit    Hypertension    Not well controlled,  Increase lisinopril to 20 mg daily        Relevant Medications   lisinopril (PRINIVIL,ZESTRIL) 20 MG tablet   Diabetes mellitus type 2 in obese ([facility])     Historically well-controlled on diet alone, but A1c has risen to 6.6 from 6.1 6 months ago,  Adding metformin XR 500 mg daily.  . . Patient is not up-to-date on eye exams and foot exam is normal today. Patient has no microalbuminuria. Patient is tolerating statin therapy for CAD risk reduction and on ACE/ARB for renal protection and hypertension  Lab Results  Component Value Date   HGBA1C 6.6* 04/13/2015   Lab Results  Component Value Date   MICROALBUR <0.7 10/11/2014               Relevant Medications   lisinopril (PRINIVIL,ZESTRIL) 20 MG tablet   metFORMIN (GLUCOPHAGE XR) 500 MG 24 hr tablet   Hyperplastic colonic polyp    .last colonosopy June 2016       Fatty liver disease, nonalcoholic  Current liver enzymes are normal and all modifiable risk factors including obesity, diabetes and hyperlipidemia have been addressed .  Vaccines for Hep A and B given.  [facility] following with MRI abdomento o evaluate liver lesion seen on u/s   Lab Results  Component Value Date   ALT 36 04/13/2015   AST 24 04/13/2015   ALKPHOS 62 04/13/2015   BILITOT 1.5* 04/13/2015            Obesity (BMI 30-39.9)    I have addressed  BMI and recommended wt loss of 10% of body weigh over the next 6 months using a low glycemic index diet and regular exercise a minimum of 5 days per week.  He has been unable to lose more than 10 lbs sicne 2012.        Relevant Medications   metFORMIN (GLUCOPHAGE XR) 500 MG 24 hr tablet   Hyperlipidemia associated with type 2 diabetes mellitus ([facility])    LDL and triglycerides are at goal on current medications. He has no side effects and liver enzymes are normal. No changes today   Lab Results  Component Value Date   CHOL 119 04/13/2015   HDL 35.70* 04/13/2015   LDLCALC 54 04/13/2015   LDLDIRECT 61.0 04/13/2015   TRIG 146.0 04/13/2015   CHOLHDL  3 04/13/2015         Relevant Medications   lisinopril (PRINIVIL,ZESTRIL) 20 MG tablet   metFORMIN (GLUCOPHAGE XR) 500 MG 24 hr tablet    Other Visit Diagnoses    Diabetes mellitus without complication ([place])    -  Primary    Relevant Medications    lisinopril (PRINIVIL,ZESTRIL) 20 MG tablet    metFORMIN (GLUCOPHAGE XR) 500 MG 24 hr tablet    Other Relevant Orders    Hemoglobin A1c (Completed)    LDL cholesterol, direct (Completed)    Lipid panel (Completed)    Comprehensive metabolic panel (Completed)    Ambulatory referral to Ophthalmology     A total of 25 minutes of face to face time was spent with patient more than half of which was spent in counselling about the above mentioned conditions  and coordination of care   I have changed Mr. Drennon lisinopril. I am also having him start on metFORMIN. Additionally, I am having him maintain his Garlic, Coenzyme [id number], multivitamin with minerals, Ascorbic Acid (C-500/ROSE HIPS PO), nizatidine, omeprazole, chlorpheniramine-HYDROcodone, atorvastatin, Fish Oil, and glucosamine-chondroitin.  Meds ordered this encounter  Medications  . Omega-3 Fatty Acids (FISH OIL) 1000 MG CAPS    Sig: Take 1,000 mg by mouth daily.  Marland Kitchen glucosamine-chondroitin 500-400 MG tablet    Sig: Take 1 tablet by mouth 3 (three) times daily.  Marland Kitchen lisinopril (PRINIVIL,ZESTRIL) 20 MG tablet    Sig: Take 1 tablet (20 mg total) by mouth daily.    Dispense:  90 tablet    Refill:  1  . metFORMIN (GLUCOPHAGE XR) 500 MG 24 hr tablet    Sig: Take 1 tablet (500 mg total) by mouth daily with breakfast.    Dispense:  30 tablet    Refill:  1    Medications Discontinued During This Encounter  Medication Reason  . lisinopril (PRINIVIL,ZESTRIL) 10 MG tablet Reorder    Follow-up: Return in about 6 months (around 10/11/2015) for annual wellness.   Crecencio Mc, MD

## 2015-04-13 NOTE — Progress Notes (Signed)
Pre visit review using our clinic review tool, if applicable. No additional management support is needed unless otherwise documented below in the visit note. 

## 2015-04-16 DIAGNOSIS — E785 Hyperlipidemia, unspecified: Secondary | ICD-10-CM

## 2015-04-16 DIAGNOSIS — E1169 Type 2 diabetes mellitus with other specified complication: Secondary | ICD-10-CM | POA: Insufficient documentation

## 2015-04-16 NOTE — Assessment & Plan Note (Signed)
LDL and triglycerides are at goal on current medications. He has no side effects and liver enzymes are normal. No changes today   Lab Results  Component Value Date   CHOL 119 04/13/2015   HDL 35.70* 04/13/2015   LDLCALC 54 04/13/2015   LDLDIRECT 61.0 04/13/2015   TRIG 146.0 04/13/2015   CHOLHDL 3 04/13/2015

## 2015-04-16 NOTE — Assessment & Plan Note (Addendum)
Historically well-controlled on diet alone, but A1c has risen to 6.6 from 6.1 6 months ago,  Adding metformin XR 500 mg daily.  . . Patient is not up-to-date on eye exams and foot exam is normal today. Patient has no microalbuminuria. Patient is tolerating statin therapy for CAD risk reduction and on ACE/ARB for renal protection and hypertension  Lab Results  Component Value Date   HGBA1C 6.6* 04/13/2015   Lab Results  Component Value Date   MICROALBUR <0.7 10/11/2014

## 2015-04-16 NOTE — Assessment & Plan Note (Signed)
Current liver enzymes are normal and all modifiable risk factors including obesity, diabetes and hyperlipidemia have been addressed .  Vaccines for Hep A and B given.  [facility] following with MRI abdomento o evaluate liver lesion seen on u/s   Lab Results  Component Value Date   ALT 36 04/13/2015   AST 24 04/13/2015   ALKPHOS 62 04/13/2015   BILITOT 1.5* 04/13/2015

## 2015-04-16 NOTE — Assessment & Plan Note (Signed)
I have addressed  BMI and recommended wt loss of 10% of body weigh over the next 6 months using a low glycemic index diet and regular exercise a minimum of 5 days per week.  He has been unable to lose more than 10 lbs sicne 2012.

## 2015-04-16 NOTE — Assessment & Plan Note (Signed)
.  last colonosopy June 2016

## 2015-04-17 ENCOUNTER — Encounter: Payer: Self-pay | Admitting: Internal Medicine

## 2015-04-17 MED ORDER — METFORMIN HCL ER 500 MG PO TB24: 500.0000 mg | ORAL_TABLET | Freq: Every day | ORAL | Status: DC

## 2015-04-17 NOTE — Assessment & Plan Note (Signed)
Not well controlled,  Increase lisinopril to 20 mg daily

## 2015-04-17 NOTE — Addendum Note (Signed)
Addended by: Crecencio Mc on: 04/17/2015 09:05 AM   Modules accepted: Orders

## 2015-04-19 ENCOUNTER — Encounter: Payer: Self-pay | Admitting: Internal Medicine

## 2015-04-19 ENCOUNTER — Ambulatory Visit (INDEPENDENT_AMBULATORY_CARE_PROVIDER_SITE_OTHER): Payer: Medicare Other | Admitting: Internal Medicine

## 2015-04-19 VITALS — BP 150/82 | HR 76 | Temp 98.1°F | Wt 237.0 lb

## 2015-04-19 DIAGNOSIS — R05 Cough: Secondary | ICD-10-CM | POA: Diagnosis not present

## 2015-04-19 DIAGNOSIS — R059 Cough, unspecified: Secondary | ICD-10-CM

## 2015-04-19 MED ORDER — HYDROCOD POLST-CPM POLST ER 10-8 MG/5ML PO SUER: 5.0000 mL | Freq: Every evening | ORAL | Status: DC | PRN

## 2015-04-19 NOTE — Progress Notes (Signed)
HPI  Pt presents to the clinic today with c/o cough. This started 3-4 days ago. The cough is productive of yellow mucous. He denies runny nose, ear pain, sore throat or shortness of breath. He denies fever, chills or body aches. He has tried cough syrup without any relief. He has no history of allergies or breathing problems. He has not had sick contacts that he is aware of.  Review of Systems      Past Medical History  Diagnosis Date  . Barrett's esophagus 2004    last EGD 2007, no metaplasia, due in 2009  . Transaminitis   . Lumbar disc herniation 2007    L5  . Diabetes mellitus   . GERD (gastroesophageal reflux disease)   . Hyperplastic colonic polyp 2004  . Fatty liver disease, nonalcoholic     Family History  Problem Relation Age of Onset  . Hypertension Mother     Social History   Social History  . Marital Status: Married    Spouse Name: N/A  . Number of Children: N/A  . Years of Education: N/A   Occupational History  . Not on file.   Social History Main Topics  . Smoking status: Former Smoker    Types: Cigarettes    Quit date: 01/21/1986  . Smokeless tobacco: Never Used  . Alcohol Use: Yes     Comment: ocassional beer  . Drug Use: No  . Sexual Activity: Not on file   Other Topics Concern  . Not on file   Social History Narrative    Allergies  Allergen Reactions  . Influenza Vac Split [Flu Virus Vaccine] Hives     Constitutional: Denies headache, fatigue, fever or abrupt weight changes.  HEENT:  Denies eye redness, eye pain, pressure behind the eyes, facial pain, nasal congestion, ear pain, ringing in the ears, wax buildup, runny nose or sore throat. Respiratory: Positive cough. Denies difficulty breathing or shortness of breath.  Cardiovascular: Denies chest pain, chest tightness, palpitations or swelling in the hands or feet.   No other specific complaints in a complete review of systems (except as listed in HPI above).  Objective:   BP  150/82 mmHg  Pulse 76  Temp(Src) 98.1 F (36.7 C) (Oral)  Wt 237 lb (107.502 kg)  SpO2 96%  Wt Readings from Last 3 Encounters:  04/19/15 237 lb (107.502 kg)  04/13/15 238 lb 8 oz (108.183 kg)  02/02/15 237 lb 6 oz (107.673 kg)     General: Appears his stated age, in NAD. HEENT: Head: normal shape and size, no sinus tenderness noted; Eyes: sclera white, no icterus, conjunctiva pink; Ears: Tm's gray and intact, normal light reflex; Throat/Mouth: Teeth present, mucosa pink and moist, no exudate noted, no lesions or ulcerations noted.  Neck: No cervical lymphadenopathy.  Cardiovascular: Normal rate and rhythm. S1,S2 noted.  Murmur noted. Pulmonary/Chest: Normal effort and positive vesicular breath sounds. No respiratory distress. No wheezes, rales or ronchi noted.      Assessment & Plan:  Cough:  Likely viral Get some rest and drink plenty of water Mucinex 600 mg Q12H Rx for Tussionex cough syrup  RTC as needed or if symptoms persist.

## 2015-04-19 NOTE — Patient Instructions (Signed)

## 2015-04-19 NOTE — Progress Notes (Signed)
Pre visit review using our clinic review tool, if applicable. No additional management support is needed unless otherwise documented below in the visit note. 

## 2015-05-10 ENCOUNTER — Telehealth: Payer: Self-pay

## 2015-05-10 NOTE — Telephone Encounter (Signed)
Pt states that she was still taking the cough syrup until last night. He said he will stop using and see if he is better. Pt will call for an appointment if symptoms do not resolve. Please advise, thanks

## 2015-05-10 NOTE — Telephone Encounter (Signed)
Pt states that he stopped taking his metFORMIN (GLUCOPHAGE XR) 500 MG 24 hr tablet. Pt is c/o sleepiness and bladder retention. Please advise, thanks

## 2015-05-10 NOTE — Telephone Encounter (Signed)
[id number]

## 2015-05-10 NOTE — Telephone Encounter (Signed)
I see no relationship between the symptoms and the stopping of metformin . Review of chart shows he was given tussionex on deb 28, th if he is still using that ,  It can cause both symptoms.  If not,  He will need to make an appt

## 2015-05-10 NOTE — Telephone Encounter (Signed)
Message already addressed.

## 2015-06-12 ENCOUNTER — Other Ambulatory Visit: Payer: Self-pay | Admitting: Internal Medicine

## 2015-06-20 DIAGNOSIS — E119 Type 2 diabetes mellitus without complications: Secondary | ICD-10-CM | POA: Diagnosis not present

## 2015-07-24 ENCOUNTER — Other Ambulatory Visit: Payer: Self-pay | Admitting: Internal Medicine

## 2015-08-05 ENCOUNTER — Other Ambulatory Visit: Payer: Self-pay | Admitting: Internal Medicine

## 2015-09-05 ENCOUNTER — Other Ambulatory Visit: Payer: Self-pay

## 2015-09-05 MED ORDER — METFORMIN HCL ER 500 MG PO TB24: ORAL_TABLET | ORAL | Status: DC

## 2015-10-04 ENCOUNTER — Other Ambulatory Visit: Payer: Self-pay | Admitting: Internal Medicine

## 2015-10-05 ENCOUNTER — Other Ambulatory Visit: Payer: Self-pay | Admitting: Internal Medicine

## 2015-10-05 NOTE — Telephone Encounter (Signed)
Last seen 04/13/2015. Last refill was 11/02/2013. Please advise.

## 2015-10-06 ENCOUNTER — Other Ambulatory Visit: Payer: Self-pay | Admitting: Internal Medicine

## 2015-10-11 ENCOUNTER — Telehealth: Payer: Self-pay | Admitting: *Deleted

## 2015-10-11 ENCOUNTER — Other Ambulatory Visit: Payer: Self-pay

## 2015-10-11 MED ORDER — OMEPRAZOLE 20 MG PO CPDR: 20.0000 mg | DELAYED_RELEASE_CAPSULE | Freq: Two times a day (BID) | ORAL | 0 refills | Status: DC

## 2015-10-11 NOTE — Telephone Encounter (Signed)
Patient has requested a medication refill for omeprazole  He stated the medication was denied, and was not sure why, he's been taking it for years.  Pharmacy [facility] in [place]

## 2015-10-11 NOTE — Telephone Encounter (Signed)
Medication has been refilled due to last order failed to be received from [place] to pharmacy.

## 2015-10-14 ENCOUNTER — Ambulatory Visit (INDEPENDENT_AMBULATORY_CARE_PROVIDER_SITE_OTHER): Payer: Medicare Other

## 2015-10-14 VITALS — BP 130/70 | HR 63 | Temp 97.9°F | Resp 12 | Ht 69.0 in | Wt 230.8 lb

## 2015-10-14 DIAGNOSIS — E669 Obesity, unspecified: Secondary | ICD-10-CM | POA: Diagnosis not present

## 2015-10-14 DIAGNOSIS — E119 Type 2 diabetes mellitus without complications: Secondary | ICD-10-CM | POA: Diagnosis not present

## 2015-10-14 DIAGNOSIS — K760 Fatty (change of) liver, not elsewhere classified: Secondary | ICD-10-CM

## 2015-10-14 DIAGNOSIS — Z0000 Encounter for general adult medical examination without abnormal findings: Secondary | ICD-10-CM

## 2015-10-14 DIAGNOSIS — E1169 Type 2 diabetes mellitus with other specified complication: Secondary | ICD-10-CM

## 2015-10-14 LAB — CBC WITH DIFFERENTIAL/PLATELET
BASOS PCT: 0.5 % (ref 0.0–3.0)
Basophils Absolute: 0 10*3/uL (ref 0.0–0.1)
EOS ABS: 0.3 10*3/uL (ref 0.0–0.7)
EOS PCT: 3.9 % (ref 0.0–5.0)
HCT: 43.5 % (ref 39.0–52.0)
Hemoglobin: 15 g/dL (ref 13.0–17.0)
LYMPHS ABS: 1.4 10*3/uL (ref 0.7–4.0)
Lymphocytes Relative: 21.2 % (ref 12.0–46.0)
MCHC: 34.6 g/dL (ref 30.0–36.0)
MCV: 87.5 fl (ref 78.0–100.0)
MONO ABS: 0.6 10*3/uL (ref 0.1–1.0)
Monocytes Relative: 8.5 % (ref 3.0–12.0)
NEUTROS PCT: 65.9 % (ref 43.0–77.0)
Neutro Abs: 4.4 10*3/uL (ref 1.4–7.7)
Platelets: 183 10*3/uL (ref 150.0–400.0)
RBC: 4.97 Mil/uL (ref 4.22–5.81)
RDW: 13.2 % (ref 11.5–15.5)
WBC: 6.7 10*3/uL (ref 4.0–10.5)

## 2015-10-14 LAB — LIPID PANEL
CHOL/HDL RATIO: 3
CHOLESTEROL: 103 mg/dL (ref 0–200)
HDL: 36.5 mg/dL — ABNORMAL LOW (ref >39.00)
LDL CALC: 39 mg/dL (ref 0–99)
NonHDL: 66.62
TRIGLYCERIDES: 140 mg/dL (ref 0.0–149.0)
VLDL: 28 mg/dL (ref 0.0–40.0)

## 2015-10-14 LAB — COMPREHENSIVE METABOLIC PANEL
ALT: 24 U/L (ref 0–53)
AST: 17 U/L (ref 0–37)
Albumin: 4.3 g/dL (ref 3.5–5.2)
Alkaline Phosphatase: 47 U/L (ref 39–117)
BILIRUBIN TOTAL: 1.4 mg/dL — AB (ref 0.2–1.2)
BUN: 13 mg/dL (ref 6–23)
CALCIUM: 9.1 mg/dL (ref 8.4–10.5)
CHLORIDE: 102 meq/L (ref 96–112)
CO2: 32 meq/L (ref 19–32)
Creatinine, Ser: 1.18 mg/dL (ref 0.40–1.50)
GFR: 64.73 mL/min (ref >60.00)
Glucose, Bld: 124 mg/dL — ABNORMAL HIGH (ref 70–99)
POTASSIUM: 4.9 meq/L (ref 3.5–5.1)
Sodium: 139 mEq/L (ref 135–145)
Total Protein: 6.6 g/dL (ref 6.0–8.3)

## 2015-10-14 LAB — HEMOGLOBIN A1C: Hgb A1c MFr Bld: 6 % (ref 4.6–6.5)

## 2015-10-14 LAB — PSA: PSA: 1.26 ng/mL (ref 0.10–4.00)

## 2015-10-14 LAB — LDL CHOLESTEROL, DIRECT: Direct LDL: 46 mg/dL

## 2015-10-14 NOTE — Patient Instructions (Addendum)
  Chad Gill , Thank you for taking time to come for your Medicare Wellness Visit. I appreciate your ongoing commitment to your health goals. Please review the following plan we discussed and let me know if I can assist you in the future.   FOLLOW UP WITH DR. Derrel Nip AS NEEDED.  These are the goals we discussed: Goals    . Healthy lifestyle          STAY ACTIVE AND CONTINUE TO EXERCISE STAY HYDRATED AND DRINK PLENTY OF FLUIDS LOW CARB FOODS. LEAN MEATS, VEGETABLES.       This is a list of the screening recommended for you and due dates:  Health Maintenance  Topic Date Due  . Shingles Vaccine  01/16/2005  . Flu Shot  09/19/2016*  . Complete foot exam   04/12/2016  . Hemoglobin A1C  04/15/2016  . Eye exam for diabetics  08/23/2016  . Tetanus Vaccine  01/21/2021  . Colon Cancer Screening  08/01/2024  .  Hepatitis C: One time screening is recommended by Center for Disease Control  (CDC) for  adults born from [birth year] through 1966.   Completed  . Pneumonia vaccines  Completed  *Topic was postponed. The date shown is not the original due date.

## 2015-10-14 NOTE — Progress Notes (Signed)
Subjective:   Chad Gill is a 70 y.o. male who presents for Medicare Annual/Subsequent preventive examination.  Review of Systems:  No ROS.  Medicare Wellness Visit.  Cardiac Risk Factors include: advanced age (>43men, >24 women);hypertension;male gender;diabetes mellitus;obesity (BMI >30kg/m2)     Objective:    Vitals: BP 130/70 (BP Location: Left Arm, Patient Position: Sitting, Cuff Size: Normal)   Pulse 63   Temp 97.9 F (36.6 C) (Oral)   Resp 12   Ht 5\' 9"  (1.753 m)   Wt 230 lb 12.8 oz (104.7 kg)   SpO2 96%   BMI 34.08 kg/m   Body mass index is 34.08 kg/m.  Tobacco History  Smoking Status  . Former Smoker  . Types: Cigarettes  . Quit date: 01/21/1986  Smokeless Tobacco  . Never Used     Counseling given: Not Answered   Past Medical History:  Diagnosis Date  . Barrett's esophagus 2004   last EGD 2007, no metaplasia, due in 2009  . Diabetes mellitus   . Fatty liver disease, nonalcoholic   . GERD (gastroesophageal reflux disease)   . Hyperplastic colonic polyp 2004  . Lumbar disc herniation 2007   L5  . Transaminitis    Past Surgical History:  Procedure Laterality Date  . SMALL INTESTINE SURGERY     carcinoid tumore, diagnosis disputed  . TONSILLECTOMY  age 74   Family History  Problem Relation Age of Onset  . Hypertension Mother    History  Sexual Activity  . Sexual activity: Yes    Outpatient Encounter Prescriptions as of 10/14/2015  Medication Sig  . Ascorbic Acid (C-500/ROSE HIPS PO) Take 1 tablet by mouth daily.  Marland Kitchen atorvastatin (LIPITOR) 20 MG tablet TAKE 1 TABLET BY MOUTH EVERY DAY  . chlorpheniramine-HYDROcodone (TUSSIONEX PENNKINETIC ER) 10-8 MG/5ML SUER Take 5 mLs by mouth at bedtime as needed for cough.  . Coenzyme Q10 (HM COQ10) 100 MG capsule Take 100 mg by mouth daily.  . Garlic 123XX123 MG CAPS Take by mouth.  Marland Kitchen glucosamine-chondroitin 500-400 MG tablet Take 1 tablet by mouth 3 (three) times daily.  Marland Kitchen lisinopril  (PRINIVIL,ZESTRIL) 20 MG tablet TAKE 1 TABLET BY MOUTH EVERY DAY  . metFORMIN (GLUCOPHAGE-XR) 500 MG 24 hr tablet TAKE 1 TABLET (500 MG TOTAL) BY MOUTH DAILY WITH BREAKFAST.  . Multiple Vitamins-Minerals (MULTIVITAMIN WITH MINERALS) tablet Take 1 tablet by mouth daily.  . nizatidine (AXID) 300 MG capsule TAKE 1 CAPSULE BY MOUTH AT BEDTIME.  Marland Kitchen Omega-3 Fatty Acids (FISH OIL) 1000 MG CAPS Take 1,000 mg by mouth daily.  Marland Kitchen omeprazole (PRILOSEC) 20 MG capsule Take 1 capsule (20 mg total) by mouth 2 (two) times daily.   No facility-administered encounter medications on file as of 10/14/2015.     Activities of Daily Living In your present state of health, do you have any difficulty performing the following activities: 10/14/2015  Hearing? Y  Vision? N  Difficulty concentrating or making decisions? N  Walking or climbing stairs? N  Dressing or bathing? N  Doing errands, shopping? N  Preparing Food and eating ? N  Using the Toilet? N  In the past six months, have you accidently leaked urine? N  Do you have problems with loss of bowel control? N  Managing your Medications? N  Managing your Finances? N  Housekeeping or managing your Housekeeping? N  Some recent data might be hidden    Patient Care Team: Crecencio Mc, MD as PCP - General (Internal Medicine)  Assessment:    This is a routine wellness examination for Chad Gill. The goal of the wellness visit is to assist the patient how to close the gaps in care and create a preventative care plan for the patient.   Osteoporosis risk reviewed.  Medications reviewed; taking without issues or barriers.  Safety issues reviewed; smoke and carbon monoxide detectors in the home. No firearms in the home. Wears seatbelts when driving or riding with others. No violence in the home.  There is no risks for hepatitis, STDs or HIV. There is no   history of blood transfusion. They have no travel history to infectious disease endemic areas of the  world.  The patient has seen their dentist in the last six month. They have seen their eye doctor in the last year. They wear bilateral hearing aids due to [place] related .  They do not  have excessive sun exposure. Discussed the need for sun protection: hats, long sleeves and use of sunscreen if there is significant sun exposure.   No identified risk were noted; The patient was oriented x 3; appropriate in dress and manner and no objective failures at ADL's or IADL's.   Body mass index; discussed the importance of a healthy diet, water intake and exercise. Educational material provided.  The benefits of regular aerobic exercise were discussed. He walks daily,  25 minutes.   Patient Concerns: None at this time. Follow up with PCP as needed.  Exercise Activities and Dietary recommendations Current Exercise Habits: Home exercise routine, Type of exercise: walking, Time (Minutes): 25, Frequency (Times/Week): 7, Weekly Exercise (Minutes/Week): 175, Intensity: Moderate  Goals    . Healthy lifestyle          STAY ACTIVE AND CONTINUE TO EXERCISE STAY HYDRATED AND DRINK PLENTY OF FLUIDS LOW CARB FOODS. LEAN MEATS, VEGETABLES.      Fall Risk Fall Risk  10/14/2015 04/13/2015 06/03/2013 01/30/2012  Falls in the past year? No No No No   Depression Screen PHQ 2/9 Scores 10/14/2015 04/13/2015 06/03/2013 01/30/2012  PHQ - 2 Score 0 0 0 0    Cognitive Testing MMSE - Mini Mental State Exam 10/14/2015  Orientation to time 5  Orientation to Place 5  Registration 3  Attention/ Calculation 5  Recall 3  Language- name 2 objects 2  Language- repeat 1  Language- follow 3 step command 3  Language- read & follow direction 1  Write a sentence 1  Copy design 1  Total score 30    Immunization History  Administered Date(s) Administered  . Hep A / Hep B 09/10/2013, 10/05/2013, 04/08/2014  . Pneumococcal Conjugate-13 06/03/2013  . Pneumococcal Polysaccharide-23 01/30/2012  . Tdap 01/22/2011    Screening Tests Health Maintenance  Topic Date Due  . ZOSTAVAX  01/16/2005  . INFLUENZA VACCINE  09/19/2016 (Originally 09/20/2015)  . FOOT EXAM  04/12/2016  . HEMOGLOBIN A1C  04/15/2016  . OPHTHALMOLOGY EXAM  08/23/2016  . TETANUS/TDAP  01/21/2021  . COLONOSCOPY  08/01/2024  . Hepatitis C Screening  Completed  . PNA vac Low Risk Adult  Completed      Plan:    End of life planning; Advance aging; Advanced directives discussed. No HCPOA/Living Will on file.  Educational material provided to help him start the conversation with his wife.   Copy of AD short forms requested upon completion.  Time spent discussing this topic 18 minutes.    Follow up with Dr. Derrel Nip as needed.  During the course of the visit the patient  was educated and counseled about the following appropriate screening and preventive services:   Vaccines to include Pneumoccal, Influenza, Hepatitis B, Td, Zostavax, HCV  Electrocardiogram  Cardiovascular Disease  Colorectal cancer screening  Diabetes screening  Prostate Cancer Screening  Glaucoma screening  Nutrition counseling   Smoking cessation counseling  Patient Instructions (the written plan) was given to the patient.    Chad Biles, LPN  [id number]  Reviewed above information.  Agree with plan.    Dr Nicki Reaper

## 2015-10-16 ENCOUNTER — Encounter: Payer: Self-pay | Admitting: Internal Medicine

## 2015-10-25 LAB — HM DIABETES EYE EXAM

## 2015-11-23 ENCOUNTER — Ambulatory Visit (INDEPENDENT_AMBULATORY_CARE_PROVIDER_SITE_OTHER): Payer: Medicare Other | Admitting: Internal Medicine

## 2015-11-23 ENCOUNTER — Ambulatory Visit (INDEPENDENT_AMBULATORY_CARE_PROVIDER_SITE_OTHER): Payer: Medicare Other

## 2015-11-23 ENCOUNTER — Encounter: Payer: Self-pay | Admitting: Internal Medicine

## 2015-11-23 VITALS — BP 142/84 | HR 73 | Temp 98.3°F | Resp 12 | Ht 69.0 in | Wt 234.5 lb

## 2015-11-23 DIAGNOSIS — E1169 Type 2 diabetes mellitus with other specified complication: Secondary | ICD-10-CM | POA: Diagnosis not present

## 2015-11-23 DIAGNOSIS — R05 Cough: Secondary | ICD-10-CM

## 2015-11-23 DIAGNOSIS — R059 Cough, unspecified: Secondary | ICD-10-CM

## 2015-11-23 DIAGNOSIS — K760 Fatty (change of) liver, not elsewhere classified: Secondary | ICD-10-CM | POA: Diagnosis not present

## 2015-11-23 DIAGNOSIS — R011 Cardiac murmur, unspecified: Secondary | ICD-10-CM | POA: Diagnosis not present

## 2015-11-23 DIAGNOSIS — I10 Essential (primary) hypertension: Secondary | ICD-10-CM

## 2015-11-23 DIAGNOSIS — E669 Obesity, unspecified: Secondary | ICD-10-CM

## 2015-11-23 NOTE — Patient Instructions (Signed)
1)  You had a chest x ray today to investigate your cough.  If the chest x ray is normal,  I will attribute your cough to the medicatio lisinopril, and will have you take an alternative for 3 months to see if the cough resolves   2) You had a slight heart murmur on today's exam.  I have ordered an ECHOCARDIOGRAM to see if one of your valves Is either stenotic (too tight)  Or regurgitant (too floppy) ,.  The office will call you when it has been set up  Please return in 3 months.  We will repeat your labs then.

## 2015-11-23 NOTE — Progress Notes (Signed)
Subjective:  Patient ID: Chad Gill, male    DOB: December 18, 1945  Age: 70 y.o. MRN: [id number]  CC: The primary encounter diagnosis was Cough. Diagnoses of Systolic murmur, Diabetes mellitus type 2 in obese ([place]), Fatty liver disease, nonalcoholic, and Essential hypertension were also pertinent to this visit.  HPI Chad Gill presents for follow up on type 2 dm, hyperlipidenmia,  Fatty liver   Has developed a dry hacking cough ,  Present for several months .  No fevers, no unintentional wt loss.  No dyspnea.  Smoked for 20 years ,  Quit over 20 years ago .   Has allergy to flu vaccine   Lab Results  Component Value Date   HGBA1C 6.0 10/14/2015     Outpatient Medications Prior to Visit  Medication Sig Dispense Refill  . Ascorbic Acid (C-500/ROSE HIPS PO) Take 1 tablet by mouth daily.    Marland Kitchen atorvastatin (LIPITOR) 20 MG tablet TAKE 1 TABLET BY MOUTH EVERY DAY 90 tablet 3  . Garlic 123XX123 MG CAPS Take by mouth.    Marland Kitchen glucosamine-chondroitin 500-400 MG tablet Take 1 tablet by mouth 3 (three) times daily.    . metFORMIN (GLUCOPHAGE-XR) 500 MG 24 hr tablet TAKE 1 TABLET (500 MG TOTAL) BY MOUTH DAILY WITH BREAKFAST. 90 tablet 1  . Multiple Vitamins-Minerals (MULTIVITAMIN WITH MINERALS) tablet Take 1 tablet by mouth daily.    . nizatidine (AXID) 300 MG capsule TAKE 1 CAPSULE BY MOUTH AT BEDTIME. 90 capsule 1  . Omega-3 Fatty Acids (FISH OIL) 1000 MG CAPS Take 1,000 mg by mouth daily.    Marland Kitchen omeprazole (PRILOSEC) 20 MG capsule Take 1 capsule (20 mg total) by mouth 2 (two) times daily. 180 capsule 0  . lisinopril (PRINIVIL,ZESTRIL) 20 MG tablet TAKE 1 TABLET BY MOUTH EVERY DAY 90 tablet 1  . Coenzyme Q10 (HM COQ10) 100 MG capsule Take 100 mg by mouth daily.    . chlorpheniramine-HYDROcodone (TUSSIONEX PENNKINETIC ER) 10-8 MG/5ML SUER Take 5 mLs by mouth at bedtime as needed for cough. 140 mL 0   No facility-administered medications prior to visit.     Review of Systems;  Patient denies  headache, fevers, malaise, unintentional weight loss, skin rash, eye pain, sinus congestion and sinus pain, sore throat, dysphagia,  hemoptysis , cough, dyspnea, wheezing, chest pain, palpitations, orthopnea, edema, abdominal pain, nausea, melena, diarrhea, constipation, flank pain, dysuria, hematuria, urinary  Frequency, nocturia, numbness, tingling, seizures,  Focal weakness, Loss of consciousness,  Tremor, insomnia, depression, anxiety, and suicidal ideation.      Objective:  BP (!) 142/84   Pulse 73   Temp 98.3 F (36.8 C) (Oral)   Resp 12   Ht 5\' 9"  (1.753 m)   Wt 234 lb 8 oz (106.4 kg)   SpO2 96%   BMI 34.63 kg/m   BP Readings from Last 3 Encounters:  11/23/15 (!) 142/84  10/14/15 130/70  04/19/15 (!) 150/82    Wt Readings from Last 3 Encounters:  11/23/15 234 lb 8 oz (106.4 kg)  10/14/15 230 lb 12.8 oz (104.7 kg)  04/19/15 237 lb (107.5 kg)    General appearance: alert, cooperative and appears stated age Ears: normal TM's and external ear canals both ears Throat: lips, mucosa, and tongue normal; teeth and gums normal Neck: no adenopathy, no carotid bruit, supple, symmetrical, trachea midline and thyroid not enlarged, symmetric, no tenderness/mass/nodules Back: symmetric, no curvature. ROM normal. No CVA tenderness. Lungs: clear to auscultation bilaterally Heart: regular rate and rhythm,  Systolic murmur radiating to both carotids, no rub, click, rub or gallop Abdomen: soft, non-tender; bowel sounds normal; no masses,  no organomegaly Pulses: 2+ and smetric Skin: Skin color, texture, turgor normal. No rashes or lesions Lymph nodes: Cervical, supraclavicular, and axillary nodes normal.  Lab Results  Component Value Date   HGBA1C 6.0 10/14/2015   HGBA1C 6.6 (H) 04/13/2015   HGBA1C 6.1 10/11/2014    Lab Results  Component Value Date   CREATININE 1.18 10/14/2015   CREATININE 1.06 04/13/2015   CREATININE 1.02 10/11/2014    Lab Results  Component Value Date    WBC 6.7 10/14/2015   HGB 15.0 10/14/2015   HCT 43.5 10/14/2015   PLT 183.0 10/14/2015   GLUCOSE 124 (H) 10/14/2015   CHOL 103 10/14/2015   TRIG 140.0 10/14/2015   HDL 36.50 (L) 10/14/2015   LDLDIRECT 46.0 10/14/2015   LDLCALC 39 10/14/2015   ALT 24 10/14/2015   AST 17 10/14/2015   NA 139 10/14/2015   K 4.9 10/14/2015   CL 102 10/14/2015   CREATININE 1.18 10/14/2015   BUN 13 10/14/2015   CO2 32 10/14/2015   PSA 1.26 10/14/2015   HGBA1C 6.0 10/14/2015   MICROALBUR <0.7 10/11/2014     Assessment & Plan:   Problem List Items Addressed This Visit    Fatty liver disease, nonalcoholic     Current liver enzymes are normal and all modifiable risk factors including obesity, diabetes and hyperlipidemia have been addressed .  Vaccines for Hep A and B given.  [facility] following with MRI abdomento o evaluate liver lesion seen on u/s   Lab Results  Component Value Date   ALT 24 10/14/2015   AST 17 10/14/2015   ALKPHOS 47 10/14/2015   BILITOT 1.4 (H) 10/14/2015            Hypertension    Well controlled on current regimen. Renal function stable, no changes today.  Lab Results  Component Value Date   CREATININE 1.18 10/14/2015   Lab Results  Component Value Date   NA 139 10/14/2015   K 4.9 10/14/2015   CL 102 10/14/2015   CO2 32 10/14/2015         Diabetes mellitus type 2 in obese ([place])    well-controlled with recent addition of  metformin XR 500 mg daily.  . . Patient is not up-to-date on eye exams and foot exam is normal today. Patient has no microalbuminuria. Patient is tolerating statin therapy for CAD risk reduction and on ACE/ARB for renal protection and hypertension  Lab Results  Component Value Date   HGBA1C 6.0 10/14/2015   Lab Results  Component Value Date   MICROALBUR <0.7 10/11/2014               Cough - Primary     chest x ray done given chronicity and history of tobacco abuse . It was normal,  Will stop lisinopril for 3 months       Relevant Orders   DG Chest 2 View (Completed)    Other Visit Diagnoses    Systolic murmur       Relevant Orders   ECHOCARDIOGRAM COMPLETE     A total of 25 minutes of face to face time was spent with patient more than half of which was spent in counselling about the above mentioned conditions  and coordination of care  I have discontinued Mr. Boese chlorpheniramine-HYDROcodone and lisinopril. I am also having him maintain his Garlic, Coenzyme [id number], multivitamin  with minerals, Ascorbic Acid (C-500/ROSE HIPS PO), atorvastatin, Fish Oil, glucosamine-chondroitin, nizatidine, metFORMIN, and omeprazole.  No orders of the defined types were placed in this encounter.   Medications Discontinued During This Encounter  Medication Reason  . chlorpheniramine-HYDROcodone (TUSSIONEX PENNKINETIC ER) 10-8 MG/5ML SUER Completed Course  . lisinopril (PRINIVIL,ZESTRIL) 20 MG tablet     Follow-up: Return in about 3 months (around 02/23/2016) for non fasting labs same day or before .   Crecencio Mc, MD

## 2015-11-23 NOTE — Progress Notes (Signed)
Pre-visit discussion using our clinic review tool. No additional management support is needed unless otherwise documented below in the visit note.  

## 2015-11-24 ENCOUNTER — Encounter: Payer: Self-pay | Admitting: Internal Medicine

## 2015-11-24 NOTE — Assessment & Plan Note (Addendum)
chest x ray done given chronicity and history of tobacco abuse . It was normal,  Will stop lisinopril for 3 months

## 2015-11-24 NOTE — Assessment & Plan Note (Signed)
Well controlled on current regimen. Renal function stable, no changes today.  Lab Results  Component Value Date   CREATININE 1.18 10/14/2015   Lab Results  Component Value Date   NA 139 10/14/2015   K 4.9 10/14/2015   CL 102 10/14/2015   CO2 32 10/14/2015

## 2015-11-24 NOTE — Assessment & Plan Note (Signed)
Current liver enzymes are normal and all modifiable risk factors including obesity, diabetes and hyperlipidemia have been addressed .  Vaccines for Hep A and B given.  [facility] following with MRI abdomento o evaluate liver lesion seen on u/s   Lab Results  Component Value Date   ALT 24 10/14/2015   AST 17 10/14/2015   ALKPHOS 47 10/14/2015   BILITOT 1.4 (H) 10/14/2015

## 2015-11-24 NOTE — Assessment & Plan Note (Signed)
well-controlled with recent addition of  metformin XR 500 mg daily.  . . Patient is not up-to-date on eye exams and foot exam is normal today. Patient has no microalbuminuria. Patient is tolerating statin therapy for CAD risk reduction and on ACE/ARB for renal protection and hypertension  Lab Results  Component Value Date   HGBA1C 6.0 10/14/2015   Lab Results  Component Value Date   MICROALBUR <0.7 10/11/2014

## 2015-12-16 ENCOUNTER — Other Ambulatory Visit: Payer: Self-pay

## 2015-12-16 ENCOUNTER — Ambulatory Visit (INDEPENDENT_AMBULATORY_CARE_PROVIDER_SITE_OTHER): Payer: Medicare Other

## 2015-12-16 DIAGNOSIS — R011 Cardiac murmur, unspecified: Secondary | ICD-10-CM

## 2015-12-16 LAB — ECHOCARDIOGRAM COMPLETE
AO mean calculated velocity dopler: 181 cm/s
AOASC: 30 cm
AOPV: 0.64 m/s
AOVTI: 53.4 cm
AV Area VTI index: 0.91 cm2/m2
AV Area VTI: 2.02 cm2
AV Area mean vel: 1.93 cm2
AV area mean vel ind: 0.87 cm2/m2
AV vel: 2.02
AVA: 2.02 cm2
AVCELMEANRAT: 0.61
AVG: 15 mmHg
AVLVOTPG: 12 mmHg
AVPG: 29 mmHg
AVPKVEL: 271 cm/s
CHL CUP AV PEAK INDEX: 0.91
CHL CUP AV VALUE AREA INDEX: 0.91
CHL CUP DOP CALC LVOT VTI: 34.3 cm
CHL CUP MV DEC (S): 246
E decel time: 246 msec
E/e' ratio: 13.35
FS: 48 % — AB (ref 28–44)
IVS/LV PW RATIO, ED: 0.96
LA ID, A-P, ES: 34 mm
LA vol index: 34.3 mL/m2
LADIAMINDEX: 1.54 cm/m2
LAVOL: 75.7 mL
LAVOLA4C: 72.7 mL
LDCA: 3.14 cm2
LEFT ATRIUM END SYS DIAM: 34 mm
LV TDI E'MEDIAL: 9.14
LVEEAVG: 13.35
LVEEMED: 13.35
LVELAT: 7.4 cm/s
LVOT diameter: 20 mm
LVOT peak VTI: 0.64 cm
LVOT peak vel: 174 cm/s
LVOTSV: 108 mL
MV pk A vel: 110 m/s
MV pk E vel: 98.8 m/s
MVPG: 4 mmHg
PW: 13.9 mm — AB (ref 0.6–1.1)
TAPSE: 21.4 mm
TDI e' lateral: 7.4

## 2016-01-06 ENCOUNTER — Other Ambulatory Visit: Payer: Self-pay | Admitting: Internal Medicine

## 2016-01-16 ENCOUNTER — Ambulatory Visit (INDEPENDENT_AMBULATORY_CARE_PROVIDER_SITE_OTHER): Payer: Medicare Other | Admitting: Family Medicine

## 2016-01-16 ENCOUNTER — Encounter: Payer: Self-pay | Admitting: Family Medicine

## 2016-01-16 DIAGNOSIS — R05 Cough: Secondary | ICD-10-CM | POA: Diagnosis not present

## 2016-01-16 DIAGNOSIS — R053 Chronic cough: Secondary | ICD-10-CM | POA: Insufficient documentation

## 2016-01-16 DIAGNOSIS — R059 Cough, unspecified: Secondary | ICD-10-CM | POA: Insufficient documentation

## 2016-01-16 MED ORDER — HYDROCOD POLST-CPM POLST ER 10-8 MG/5ML PO SUER: 5.0000 mL | Freq: Two times a day (BID) | ORAL | 0 refills | Status: DC | PRN

## 2016-01-16 NOTE — Progress Notes (Signed)
Pre visit review using our clinic review tool, if applicable. No additional management support is needed unless otherwise documented below in the visit note. 

## 2016-01-16 NOTE — Progress Notes (Signed)
   Subjective:  Patient ID: Chad Gill, male    DOB: 04/25/1945  Age: 70 y.o. MRN: [id number]  CC: Cough  HPI:  70 year old male presents with complaints of cough.  Patient reports he's had a cough since last Wednesday. Cough mildly productive. Moderate in severity. Cough is improving during the day but is quite troublesome at night. No associated shortness of breath. No fevers or chills. No other complaints or issues at this time.  Social Hx   Social History   Social History  . Marital status: Married    Spouse name: N/A  . Number of children: N/A  . Years of education: N/A   Social History Main Topics  . Smoking status: Former Smoker    Types: Cigarettes    Quit date: 01/21/1986  . Smokeless tobacco: Never Used  . Alcohol use Yes     Comment: ocassional beer  . Drug use: No  . Sexual activity: Yes   Other Topics Concern  . None   Social History Narrative  . None   Review of Systems  Constitutional: Negative for fever.  Respiratory: Positive for cough. Negative for shortness of breath.    Objective:  BP 140/78 (BP Location: Left Arm, Patient Position: Sitting, Cuff Size: Normal)   Pulse 80   Temp 98.6 F (37 C) (Oral)   Resp 16   Wt 235 lb 2 oz (106.7 kg)   SpO2 98%   BMI 34.72 kg/m   BP/Weight 01/16/2016 11/23/2015 [id number]  Systolic BP [id number]  Diastolic BP 78 84 70  Wt. (Lbs) 235.13 234.5 230.8  BMI 34.72 34.63 34.08   Physical Exam  Constitutional: He is oriented to person, place, and time. He appears well-developed. No distress.  HENT:  Head: Normocephalic and atraumatic.  Mouth/Throat: Oropharynx is clear and moist.  Cardiovascular: Normal rate and regular rhythm.   2/6 systolic murmur.  Neurological: He is alert and oriented to person, place, and time.  Psychiatric: He has a normal mood and affect.  Vitals reviewed.   Lab Results  Component Value Date   WBC 6.7 10/14/2015   HGB 15.0 10/14/2015   HCT 43.5 10/14/2015   PLT 183.0  10/14/2015   GLUCOSE 124 (H) 10/14/2015   CHOL 103 10/14/2015   TRIG 140.0 10/14/2015   HDL 36.50 (L) 10/14/2015   LDLDIRECT 46.0 10/14/2015   LDLCALC 39 10/14/2015   ALT 24 10/14/2015   AST 17 10/14/2015   NA 139 10/14/2015   K 4.9 10/14/2015   CL 102 10/14/2015   CREATININE 1.18 10/14/2015   BUN 13 10/14/2015   CO2 32 10/14/2015   PSA 1.26 10/14/2015   HGBA1C 6.0 10/14/2015   MICROALBUR <0.7 10/11/2014    Assessment & Plan:   Problem List Items Addressed This Visit    Cough    New acute problem. Treating with Tussionex. No indication for antibiotics or imaging.        Meds ordered this encounter  Medications  . chlorpheniramine-HYDROcodone (TUSSIONEX PENNKINETIC ER) 10-8 MG/5ML SUER    Sig: Take 5 mLs by mouth every 12 (twelve) hours as needed.    Dispense:  115 mL    Refill:  0    Follow-up: PRN  [place]

## 2016-01-16 NOTE — Assessment & Plan Note (Signed)
New acute problem. Treating with Tussionex. No indication for antibiotics or imaging.

## 2016-01-16 NOTE — Patient Instructions (Signed)
Use the cough medication as prescribed (at night mostly)  Call if you fail to improve or worsen.  Take care  Dr. Lacinda Axon

## 2016-01-17 ENCOUNTER — Other Ambulatory Visit: Payer: Self-pay | Admitting: Internal Medicine

## 2016-01-24 ENCOUNTER — Telehealth: Payer: Self-pay | Admitting: *Deleted

## 2016-01-24 NOTE — Telephone Encounter (Signed)
Do you want him to be re-evaluated for this issue?

## 2016-01-24 NOTE — Telephone Encounter (Signed)
Patient was seen in the office on 01/16/16 by Dr. Lacinda Axon, pt reported to have a constant cough, even after his cough medicine.Marland Kitchen He requested to have a antibiotic.  Pharmacy [facility]  Pt contact [phone]

## 2016-01-24 NOTE — Telephone Encounter (Signed)
Needs to see PCP

## 2016-01-24 NOTE — Telephone Encounter (Signed)
Pt was called and told to be re-evaluated. He gave verbal understanding.

## 2016-02-24 ENCOUNTER — Ambulatory Visit: Payer: Medicare Other | Admitting: Internal Medicine

## 2016-02-27 ENCOUNTER — Other Ambulatory Visit: Payer: Self-pay | Admitting: Internal Medicine

## 2016-03-22 ENCOUNTER — Ambulatory Visit: Payer: Medicare Other | Admitting: Internal Medicine

## 2016-03-26 ENCOUNTER — Encounter: Payer: Self-pay | Admitting: Internal Medicine

## 2016-03-26 ENCOUNTER — Ambulatory Visit (INDEPENDENT_AMBULATORY_CARE_PROVIDER_SITE_OTHER): Payer: Medicare Other | Admitting: Internal Medicine

## 2016-03-26 VITALS — BP 150/84 | HR 84 | Temp 98.0°F | Resp 16 | Wt 236.0 lb

## 2016-03-26 DIAGNOSIS — K760 Fatty (change of) liver, not elsewhere classified: Secondary | ICD-10-CM | POA: Diagnosis not present

## 2016-03-26 DIAGNOSIS — E1169 Type 2 diabetes mellitus with other specified complication: Secondary | ICD-10-CM | POA: Diagnosis not present

## 2016-03-26 DIAGNOSIS — E669 Obesity, unspecified: Secondary | ICD-10-CM

## 2016-03-26 DIAGNOSIS — I10 Essential (primary) hypertension: Secondary | ICD-10-CM | POA: Diagnosis not present

## 2016-03-26 DIAGNOSIS — E785 Hyperlipidemia, unspecified: Secondary | ICD-10-CM | POA: Diagnosis not present

## 2016-03-26 LAB — COMPREHENSIVE METABOLIC PANEL
ALBUMIN: 4.3 g/dL (ref 3.5–5.2)
ALT: 25 U/L (ref 0–53)
AST: 17 U/L (ref 0–37)
Alkaline Phosphatase: 56 U/L (ref 39–117)
BUN: 12 mg/dL (ref 6–23)
CALCIUM: 9.4 mg/dL (ref 8.4–10.5)
CHLORIDE: 100 meq/L (ref 96–112)
CO2: 30 meq/L (ref 19–32)
Creatinine, Ser: 1 mg/dL (ref 0.40–1.50)
GFR: 78.25 mL/min (ref >60.00)
Glucose, Bld: 221 mg/dL — ABNORMAL HIGH (ref 70–99)
Potassium: 4.2 mEq/L (ref 3.5–5.1)
Sodium: 135 mEq/L (ref 135–145)
Total Bilirubin: 0.8 mg/dL (ref 0.2–1.2)
Total Protein: 7 g/dL (ref 6.0–8.3)

## 2016-03-26 LAB — MICROALBUMIN / CREATININE URINE RATIO
CREATININE, U: 70.9 mg/dL
MICROALB/CREAT RATIO: 1 mg/g (ref 0.0–30.0)

## 2016-03-26 LAB — LIPID PANEL
CHOL/HDL RATIO: 3
CHOLESTEROL: 118 mg/dL (ref 0–200)
HDL: 34.8 mg/dL — ABNORMAL LOW (ref >39.00)
NONHDL: 83.12
TRIGLYCERIDES: 212 mg/dL — AB (ref 0.0–149.0)
VLDL: 42.4 mg/dL — AB (ref 0.0–40.0)

## 2016-03-26 LAB — LDL CHOLESTEROL, DIRECT: Direct LDL: 59 mg/dL

## 2016-03-26 LAB — HEMOGLOBIN A1C: Hgb A1c MFr Bld: 6.4 % (ref 4.6–6.5)

## 2016-03-26 MED ORDER — OSELTAMIVIR PHOSPHATE 75 MG PO CAPS: 75.0000 mg | ORAL_CAPSULE | Freq: Every day | ORAL | 0 refills | Status: DC

## 2016-03-26 MED ORDER — LOSARTAN POTASSIUM 25 MG PO TABS: 25.0000 mg | ORAL_TABLET | Freq: Every day | ORAL | 0 refills | Status: DC

## 2016-03-26 NOTE — Progress Notes (Signed)
Subjective:  Patient ID: Chad Gill, male    DOB: 06/28/1945  Age: 71 y.o. MRN: [id number]  CC: The primary encounter diagnosis was Diabetes mellitus type 2 in obese ([place]). Diagnoses of Fatty liver disease, nonalcoholic, Hyperlipidemia associated with type 2 diabetes mellitus ([place]), Obesity (BMI 30-39.9), and Essential hypertension were also pertinent to this visit.  HPI Chad Gill presents for FOLLOW UP ON DM AND hyperlipidemia. He feels generally well, is exercising several times per week and checking blood sugars once daily at variable times.  BS have been under 130 fasting and < 150 post prandially.  Denies any recent hypoglyemic events.  Taking his medications as directed. Following a carbohydrate modified diet 6 days per week. Denies numbness, burning and tingling of extremities. Appetite is good.    LAST EYE EXAM  Fall  2017  Vision has mproved   FOOT EXAM NORMAL TODAY  Home blood pressure readings,  [id number],   [id number] systolic     Outpatient Medications Prior to Visit  Medication Sig Dispense Refill  . Ascorbic Acid (C-500/ROSE HIPS PO) Take 1 tablet by mouth daily.    Marland Kitchen atorvastatin (LIPITOR) 20 MG tablet TAKE 1 TABLET BY MOUTH EVERY DAY 90 tablet 3  . Coenzyme Q10 (HM COQ10) 100 MG capsule Take 100 mg by mouth daily.    Marland Kitchen glucosamine-chondroitin 500-400 MG tablet Take 1 tablet by mouth 3 (three) times daily.    . metFORMIN (GLUCOPHAGE-XR) 500 MG 24 hr tablet TAKE 1 TABLET (500 MG TOTAL) BY MOUTH DAILY WITH BREAKFAST. 90 tablet 1  . Multiple Vitamins-Minerals (MULTIVITAMIN WITH MINERALS) tablet Take 1 tablet by mouth daily.    . Omega-3 Fatty Acids (FISH OIL) 1000 MG CAPS Take 1,000 mg by mouth daily.    . chlorpheniramine-HYDROcodone (TUSSIONEX PENNKINETIC ER) 10-8 MG/5ML SUER Take 5 mLs by mouth every 12 (twelve) hours as needed. (Patient not taking: Reported on 03/26/2016) 115 mL 0  . Garlic 123XX123 MG CAPS Take by mouth.    . nizatidine (AXID) 300 MG capsule TAKE 1 CAPSULE BY  MOUTH AT BEDTIME. (Patient not taking: Reported on 03/26/2016) 90 capsule 1  . omeprazole (PRILOSEC) 20 MG capsule TAKE 1 CAPSULE TWICE A DAY (Patient not taking: Reported on 03/26/2016) 180 capsule 0   No facility-administered medications prior to visit.     Review of Systems;  Patient denies headache, fevers, malaise, unintentional weight loss, skin rash, eye pain, sinus congestion and sinus pain, sore throat, dysphagia,  hemoptysis , cough, dyspnea, wheezing, chest pain, palpitations, orthopnea, edema, abdominal pain, nausea, melena, diarrhea, constipation, flank pain, dysuria, hematuria, urinary  Frequency, nocturia, numbness, tingling, seizures,  Focal weakness, Loss of consciousness,  Tremor, insomnia, depression, anxiety, and suicidal ideation.      Objective:  BP (!) 150/84   Pulse 84   Temp 98 F (36.7 C) (Oral)   Resp 16   Wt 236 lb (107 kg)   SpO2 97%   BMI 34.85 kg/m   BP Readings from Last 3 Encounters:  03/26/16 (!) 150/84  01/16/16 140/78  11/23/15 (!) 142/84    Wt Readings from Last 3 Encounters:  03/26/16 236 lb (107 kg)  01/16/16 235 lb 2 oz (106.7 kg)  11/23/15 234 lb 8 oz (106.4 kg)    General appearance: alert, cooperative and appears stated age Ears: normal TM's and external ear canals both ears Throat: lips, mucosa, and tongue normal; teeth and gums normal Neck: no adenopathy, no carotid bruit, supple, symmetrical, trachea midline and  thyroid not enlarged, symmetric, no tenderness/mass/nodules Back: symmetric, no curvature. ROM normal. No CVA tenderness. Lungs: clear to auscultation bilaterally Heart: regular rate and rhythm, S1, S2 normal, no murmur, click, rub or gallop Abdomen: soft, non-tender; bowel sounds normal; no masses,  no organomegaly Pulses: 2+ and symmetric Skin: Skin color, texture, turgor normal. No rashes or lesions Lymph nodes: Cervical, supraclavicular, and axillary nodes normal.  Lab Results  Component Value Date   HGBA1C 6.4  03/26/2016   HGBA1C 6.0 10/14/2015   HGBA1C 6.6 (H) 04/13/2015    Lab Results  Component Value Date   CREATININE 1.00 03/26/2016   CREATININE 1.18 10/14/2015   CREATININE 1.06 04/13/2015    Lab Results  Component Value Date   WBC 6.7 10/14/2015   HGB 15.0 10/14/2015   HCT 43.5 10/14/2015   PLT 183.0 10/14/2015   GLUCOSE 221 (H) 03/26/2016   CHOL 118 03/26/2016   TRIG 212.0 (H) 03/26/2016   HDL 34.80 (L) 03/26/2016   LDLDIRECT 59.0 03/26/2016   LDLCALC 39 10/14/2015   ALT 25 03/26/2016   AST 17 03/26/2016   NA 135 03/26/2016   K 4.2 03/26/2016   CL 100 03/26/2016   CREATININE 1.00 03/26/2016   BUN 12 03/26/2016   CO2 30 03/26/2016   PSA 1.26 10/14/2015   HGBA1C 6.4 03/26/2016   MICROALBUR <0.7 03/26/2016    Dg Foot Complete Right  Result Date: 04/08/2014 CLINICAL DATA:  Heel pain with weight-bearing, no trauma EXAM: RIGHT FOOT COMPLETE - 3+ VIEW COMPARISON:  None. FINDINGS: Tarsal metatarsal alignment is normal. There is mild degenerative change involving the right first MTP joint with some loss of joint space. No erosion is seen. On the lateral view there are small degenerative spurs emanating from the plantar aspect of the calcaneus as well as the insertion of the Achilles tendon. IMPRESSION: Small degenerative calcaneal spurs. Mild degenerative change of the right first MTP joint. Electronically Signed   By: Ivar Drape M.D.   On: 04/08/2014 15:21    Assessment & Plan:   Problem List Items Addressed This Visit    Diabetes mellitus type 2 in obese ([place]) - Primary    well-controlled with recent addition of  metformin XR 500 mg daily.  . . Patient is not up-to-date on eye exams and foot exam is normal today. Patient has no microalbuminuria. Patient is tolerating statin therapy for CAD risk reduction and on ACE/ARB for renal protection and hypertension  Lab Results  Component Value Date   HGBA1C 6.4 03/26/2016   Lab Results  Component Value Date   MICROALBUR <0.7  03/26/2016               Relevant Medications   losartan (COZAAR) 25 MG tablet   Other Relevant Orders   Microalbumin / creatinine urine ratio (Completed)   Hemoglobin A1c (Completed)   Fatty liver disease, nonalcoholic   Relevant Orders   Comprehensive metabolic panel (Completed)   Hyperlipidemia associated with type 2 diabetes mellitus ([facility])    LDL and triglycerides are at goal on current medications. He has no side effects and liver enzymes are normal. No changes today   Lab Results  Component Value Date   CHOL 118 03/26/2016   HDL 34.80 (L) 03/26/2016   LDLCALC 39 10/14/2015   LDLDIRECT 59.0 03/26/2016   TRIG 212.0 (H) 03/26/2016   CHOLHDL 3 03/26/2016   Lab Results  Component Value Date   ALT 25 03/26/2016   AST 17 03/26/2016   ALKPHOS 56  03/26/2016   BILITOT 0.8 03/26/2016         Relevant Medications   losartan (COZAAR) 25 MG tablet   Other Relevant Orders   Lipid panel (Completed)   Hypertension    Not well controlled on current regimen. Renal function stable. Adding losartan 25 mg daily   Lab Results  Component Value Date   CREATININE 1.00 03/26/2016   Lab Results  Component Value Date   NA 135 03/26/2016   K 4.2 03/26/2016   CL 100 03/26/2016   CO2 30 03/26/2016         Relevant Medications   losartan (COZAAR) 25 MG tablet   Obesity (BMI 30-39.9)    I have addressed  BMI and recommended wt loss of 10% of body weight over the next 6 months using a low fat, low starch, high protein  fruit/vegetable based Mediterranean diet and 30 minutes of aerobic exercise a minimum of 5 days per week.           I am having Mr. Petruccelli start on oseltamivir and losartan. I am also having him maintain his Garlic, Coenzyme [id number], multivitamin with minerals, Ascorbic Acid (C-500/ROSE HIPS PO), Fish Oil, glucosamine-chondroitin, metFORMIN, omeprazole, chlorpheniramine-HYDROcodone, nizatidine, and atorvastatin.  Meds ordered this encounter  Medications  .  oseltamivir (TAMIFLU) 75 MG capsule    Sig: Take 1 capsule (75 mg total) by mouth daily.    Dispense:  10 capsule    Refill:  0  . losartan (COZAAR) 25 MG tablet    Sig: Take 1 tablet (25 mg total) by mouth daily.    Dispense:  90 tablet    Refill:  0    There are no discontinued medications.  Follow-up: Return in about 6 months (around 09/23/2016) for follow up diabetes.   Crecencio Mc, MD

## 2016-03-26 NOTE — Progress Notes (Signed)
Pre visit review using our clinic review tool, if applicable. No additional management support is needed unless otherwise documented below in the visit note. 

## 2016-03-26 NOTE — Patient Instructions (Addendum)
I would like to start you on losartan 25 mg daily to get your blood pressure under  120/70   The ShingRx vaccine will be available in about 6 months and IS ADVISED for all interested adults over 50 to prevent shingles .   SINCE YOU CAN'T TAKE THE FLU VACCINE,  I AM GIVING YOU A PRESCRIPTION FOR TAMIFLU  The full dose of tamiflu (twice daily for 5 days)  IS FOR FLU SYMPTOMS,   But the half dose is one tablet daily for  10 DAYS FOR Prevention ( IF YOU GET EXPOSED BUT HAVE NO SYMPTOMS )

## 2016-03-27 NOTE — Assessment & Plan Note (Signed)
well-controlled with recent addition of  metformin XR 500 mg daily.  . . Patient is not up-to-date on eye exams and foot exam is normal today. Patient has no microalbuminuria. Patient is tolerating statin therapy for CAD risk reduction and on ACE/ARB for renal protection and hypertension  Lab Results  Component Value Date   HGBA1C 6.4 03/26/2016   Lab Results  Component Value Date   MICROALBUR <0.7 03/26/2016

## 2016-03-27 NOTE — Assessment & Plan Note (Signed)
LDL and triglycerides are at goal on current medications. He has no side effects and liver enzymes are normal. No changes today   Lab Results  Component Value Date   CHOL 118 03/26/2016   HDL 34.80 (L) 03/26/2016   LDLCALC 39 10/14/2015   LDLDIRECT 59.0 03/26/2016   TRIG 212.0 (H) 03/26/2016   CHOLHDL 3 03/26/2016   Lab Results  Component Value Date   ALT 25 03/26/2016   AST 17 03/26/2016   ALKPHOS 56 03/26/2016   BILITOT 0.8 03/26/2016

## 2016-03-27 NOTE — Assessment & Plan Note (Signed)
Not well controlled on current regimen. Renal function stable. Adding losartan 25 mg daily   Lab Results  Component Value Date   CREATININE 1.00 03/26/2016   Lab Results  Component Value Date   NA 135 03/26/2016   K 4.2 03/26/2016   CL 100 03/26/2016   CO2 30 03/26/2016

## 2016-03-27 NOTE — Assessment & Plan Note (Signed)
I have addressed  BMI and recommended wt loss of 10% of body weight over the next 6 months using a low fat, low starch, high protein  fruit/vegetable based Mediterranean diet and 30 minutes of aerobic exercise a minimum of 5 days per week.   

## 2016-04-04 ENCOUNTER — Other Ambulatory Visit: Payer: Self-pay | Admitting: Internal Medicine

## 2016-04-26 ENCOUNTER — Other Ambulatory Visit: Payer: Self-pay | Admitting: Internal Medicine

## 2016-06-16 ENCOUNTER — Other Ambulatory Visit: Payer: Self-pay | Admitting: Internal Medicine

## 2016-06-16 DIAGNOSIS — I10 Essential (primary) hypertension: Secondary | ICD-10-CM

## 2016-07-17 ENCOUNTER — Other Ambulatory Visit: Payer: Self-pay | Admitting: Internal Medicine

## 2016-07-20 DIAGNOSIS — H6123 Impacted cerumen, bilateral: Secondary | ICD-10-CM | POA: Diagnosis not present

## 2016-07-20 DIAGNOSIS — H903 Sensorineural hearing loss, bilateral: Secondary | ICD-10-CM | POA: Diagnosis not present

## 2016-09-08 ENCOUNTER — Other Ambulatory Visit: Payer: Self-pay | Admitting: Internal Medicine

## 2016-09-08 DIAGNOSIS — I10 Essential (primary) hypertension: Secondary | ICD-10-CM

## 2016-09-24 ENCOUNTER — Ambulatory Visit: Payer: Medicare Other | Admitting: Internal Medicine

## 2016-10-05 ENCOUNTER — Ambulatory Visit (INDEPENDENT_AMBULATORY_CARE_PROVIDER_SITE_OTHER): Payer: Medicare Other | Admitting: Internal Medicine

## 2016-10-05 ENCOUNTER — Encounter: Payer: Self-pay | Admitting: Internal Medicine

## 2016-10-05 VITALS — BP 148/78 | HR 83 | Temp 98.6°F | Resp 15 | Ht 69.0 in | Wt 240.6 lb

## 2016-10-05 DIAGNOSIS — I10 Essential (primary) hypertension: Secondary | ICD-10-CM

## 2016-10-05 DIAGNOSIS — E1169 Type 2 diabetes mellitus with other specified complication: Secondary | ICD-10-CM | POA: Diagnosis not present

## 2016-10-05 DIAGNOSIS — Z125 Encounter for screening for malignant neoplasm of prostate: Secondary | ICD-10-CM

## 2016-10-05 DIAGNOSIS — Z0001 Encounter for general adult medical examination with abnormal findings: Secondary | ICD-10-CM | POA: Diagnosis not present

## 2016-10-05 DIAGNOSIS — E669 Obesity, unspecified: Secondary | ICD-10-CM

## 2016-10-05 DIAGNOSIS — Z0000 Encounter for general adult medical examination without abnormal findings: Secondary | ICD-10-CM

## 2016-10-05 MED ORDER — LOSARTAN POTASSIUM 50 MG PO TABS: 50.0000 mg | ORAL_TABLET | Freq: Every day | ORAL | 1 refills | Status: DC

## 2016-10-05 NOTE — Progress Notes (Signed)
Patient ID: Chad Gill, male    DOB: 29-Jan-1946  Age: 71 y.o. MRN: [id number]  The patient is here for annual preventive examination and management of other chronic and acute problems.  PSA due colonoscopy 2016 TDap 2012 Prevnar 2015 Pvax 2013   The risk factors are reflected in the social history.  The roster of all physicians providing medical care to patient - is listed in the Snapshot section of the chart.  Activities of daily living:  The patient is 100% independent in all ADLs: dressing, toileting, feeding as well as independent mobility  Home safety : The patient has smoke detectors in the home. They wear seatbelts.  There are no firearms at home. There is no violence in the home.   There is no risks for hepatitis, STDs or HIV. There is no   history of blood transfusion. They have no travel history to infectious disease endemic areas of the world.  The patient has seen their dentist in the last six month. They have seen their eye doctor in the last year. They admit to slight hearing difficulty with regard to whispered voices and some television programs.  They have deferred audiologic testing in the last year.  They do not  have excessive sun exposure. Discussed the need for sun protection: hats, long sleeves and use of sunscreen if there is significant sun exposure.   Diet: the importance of a healthy diet is discussed. They do have a healthy diet.  The benefits of regular aerobic exercise were discussed. He uses the elliptilglider 5 times per week for 30 minutes. .   Depression screen: there are no signs or vegative symptoms of depression- irritability, change in appetite, anhedonia, sadness/tearfullness.  Cognitive assessment: the patient manages all their financial and personal affairs and is actively engaged. They could relate day,date,year and events; recalled 2/3 objects at 3 minutes; performed clock-face test normally.  The following portions of the patient's history  were reviewed and updated as appropriate: allergies, current medications, past family history, past medical history,  past surgical history, past social history  and problem list.  Visual acuity was not assessed per patient preference since she has regular follow up with her ophthalmologist. Hearing and body mass index were assessed and reviewed.   During the course of the visit the patient was educated and counseled about appropriate screening and preventive services including : fall prevention , diabetes screening, nutrition counseling, colorectal cancer screening, and recommended immunizations.    CC: The primary encounter diagnosis was Prostate cancer screening. Diagnoses of Essential hypertension, Diabetes mellitus type 2 in obese [facility]), and Medicare annual wellness visit, subsequent were also pertinent to this visit.  6 month follow up on diabetes.  Patient has no complaints today.  Patient is following a low glycemic index diet and taking all prescribed medications regularly without side effects.  Fasting sugars have been  Infrequently checked and  less than 140 most of the time and post prandials have been under 160 except on rare occasions. Patient is exercising about 3 times per week and intentionally trying to lose weight .  Patient has had an eye exam in the last 12 months and checks feet regularly for signs of infection.  Patient does not walk barefoot outside,  And denies an numbness tingling or burning in feet. Patient is up to date on all recommended vaccinations   History Chad Gill has a past medical history of Barrett's esophagus (2004); Diabetes mellitus; Fatty liver disease, nonalcoholic; GERD (gastroesophageal reflux disease);  Hyperplastic colonic polyp (2004); Lumbar disc herniation (2007); and Transaminitis.   He has a past surgical history that includes Tonsillectomy (age 64) and Small intestine surgery.   His family history includes Hypertension in his mother.He reports that he  quit smoking about 30 years ago. His smoking use included Cigarettes. He has never used smokeless tobacco. He reports that he drinks alcohol. He reports that he does not use drugs.  Outpatient Medications Prior to Visit  Medication Sig Dispense Refill  . Ascorbic Acid (C-500/ROSE HIPS PO) Take 1 tablet by mouth daily.    Marland Kitchen atorvastatin (LIPITOR) 20 MG tablet TAKE 1 TABLET BY MOUTH EVERY DAY 90 tablet 3  . Coenzyme Q10 (HM COQ10) 100 MG capsule Take 100 mg by mouth daily.    . Garlic 4008 MG CAPS Take by mouth.    Marland Kitchen glucosamine-chondroitin 500-400 MG tablet Take 1 tablet by mouth 3 (three) times daily.    . metFORMIN (GLUCOPHAGE-XR) 500 MG 24 hr tablet TAKE 1 TABLET BY MOUTH EVERY DAY WITH BREAKFAST 90 tablet 1  . Multiple Vitamins-Minerals (MULTIVITAMIN WITH MINERALS) tablet Take 1 tablet by mouth daily.    . nizatidine (AXID) 300 MG capsule TAKE 1 CAPSULE BY MOUTH AT BEDTIME. 90 capsule 1  . Omega-3 Fatty Acids (FISH OIL) 1000 MG CAPS Take 1,000 mg by mouth daily.    Marland Kitchen omeprazole (PRILOSEC) 20 MG capsule TAKE 1 CAPSULE TWICE A DAY 180 capsule 2  . losartan (COZAAR) 25 MG tablet TAKE 1 TABLET BY MOUTH EVERY DAY 90 tablet 0  . chlorpheniramine-HYDROcodone (TUSSIONEX PENNKINETIC ER) 10-8 MG/5ML SUER Take 5 mLs by mouth every 12 (twelve) hours as needed. (Patient not taking: Reported on 03/26/2016) 115 mL 0  . oseltamivir (TAMIFLU) 75 MG capsule Take 1 capsule (75 mg total) by mouth daily. (Patient not taking: Reported on 10/05/2016) 10 capsule 0   No facility-administered medications prior to visit.     Review of Systems   Patient denies headache, fevers, malaise, unintentional weight loss, skin rash, eye pain, sinus congestion and sinus pain, sore throat, dysphagia,  hemoptysis , cough, dyspnea, wheezing, chest pain, palpitations, orthopnea, edema, abdominal pain, nausea, melena, diarrhea, constipation, flank pain, dysuria, hematuria, urinary  Frequency, nocturia, numbness, tingling, seizures,   Focal weakness, Loss of consciousness,  Tremor, insomnia, depression, anxiety, and suicidal ideation.      Objective:  BP (!) 148/78 (BP Location: Left Arm, Patient Position: Sitting, Cuff Size: Large)   Pulse 83   Temp 98.6 F (37 C) (Oral)   Resp 15   Ht 5\' 9"  (1.753 m)   Wt 240 lb 9.6 oz (109.1 kg)   SpO2 95%   BMI 35.53 kg/m   Physical Exam   General appearance: alert, cooperative and appears stated age Ears: normal TM's and external ear canals both ears Throat: lips, mucosa, and tongue normal; teeth and gums normal Neck: no adenopathy, no carotid bruit, supple, symmetrical, trachea midline and thyroid not enlarged, symmetric, no tenderness/mass/nodules Back: symmetric, no curvature. ROM normal. No CVA tenderness. Lungs: clear to auscultation bilaterally Heart: regular rate and rhythm, S1, S2 normal, no murmur, click, rub or gallop Abdomen: soft, non-tender; bowel sounds normal; no masses,  no organomegaly Pulses: 2+ and symmetric Skin: Skin color, texture, turgor normal. No rashes or lesions Lymph nodes: Cervical, supraclavicular, and axillary nodes normal.    Assessment & Plan:   Problem List Items Addressed This Visit    Diabetes mellitus type 2 in obese [facility])   Relevant Medications  losartan (COZAAR) 50 MG tablet   Other Relevant Orders   Hemoglobin A1c   Lipid panel   Hypertension    he reports compliance with medication regimen  but has an elevated reading today in office.  Increase losartan to 50 mg daily.  He has been asked to check his BP at work and  submit readings for evaluation. Renal function will be checked today  Lab Results  Component Value Date   CREATININE 1.00 03/26/2016   Lab Results  Component Value Date   NA 135 03/26/2016   K 4.2 03/26/2016   CL 100 03/26/2016   CO2 30 03/26/2016         Relevant Medications   losartan (COZAAR) 50 MG tablet   Other Relevant Orders   Comprehensive metabolic panel   Medicare annual wellness  visit, subsequent    Annual comprehensive preventive exam was done as well as an evaluation and management of acute and chronic conditions .  During the course of the visit the patient was educated and counseled about appropriate screening and preventive services including :  diabetes screening, lipid analysis with projected  10 year  risk for CAD , nutrition counseling, prostate and colorectal cancer screening, and recommended immunizations.  Printed recommendations for health maintenance screenings was given.        Other Visit Diagnoses    Prostate cancer screening    -  Primary   Relevant Orders   PSA, Medicare      I have discontinued Mr. Glasheen chlorpheniramine-HYDROcodone and oseltamivir. I have also changed his losartan. Additionally, I am having him maintain his Garlic, Coenzyme Y10, multivitamin with minerals, Ascorbic Acid (C-500/ROSE HIPS PO), Fish Oil, glucosamine-chondroitin, atorvastatin, omeprazole, metFORMIN, and nizatidine.  Meds ordered this encounter  Medications  . losartan (COZAAR) 50 MG tablet    Sig: Take 1 tablet (50 mg total) by mouth daily.    Dispense:  90 tablet    Refill:  1    Do not fill yet,  Keep on file for refill in early September.  Note dose increase    Medications Discontinued During This Encounter  Medication Reason  . chlorpheniramine-HYDROcodone (TUSSIONEX PENNKINETIC ER) 10-8 MG/5ML SUER Patient has not taken in last 30 days  . oseltamivir (TAMIFLU) 75 MG capsule Patient has not taken in last 30 days  . losartan (COZAAR) 25 MG tablet Reorder    Follow-up: Return in about 6 months (around 04/07/2017) for labs after August 25th,  6 month with me .   Crecencio Mc, MD

## 2016-10-05 NOTE — Patient Instructions (Addendum)
If your blood sugars 2 hours after a meal is > 160,  Use that food in moderation (not more than once a week )   your blood pressure s consistently above the currently recommended acceptable standards of 130/70, so I am recommending that you increase the losartan to 50 mg daily  And I  I have sent it to your pharmacy .  You can use your 25 mg tablets daily for now and just take 2 daily   Return after August 25th for fasting labs.   The ShingRx vaccine is now available in local pharmacies and is much more protective thant Zostavaxs,  It is therefore ADVISED for all interested adults over 50 to prevent shingles

## 2016-10-07 ENCOUNTER — Encounter: Payer: Self-pay | Admitting: Internal Medicine

## 2016-10-07 NOTE — Assessment & Plan Note (Addendum)
he reports compliance with medication regimen  but has an elevated reading today in office.  Increase losartan to 50 mg daily.  He has been asked to check his BP at work and  submit readings for evaluation. Renal function will be checked today  Lab Results  Component Value Date   CREATININE 1.00 03/26/2016   Lab Results  Component Value Date   NA 135 03/26/2016   K 4.2 03/26/2016   CL 100 03/26/2016   CO2 30 03/26/2016

## 2016-10-07 NOTE — Assessment & Plan Note (Signed)

## 2016-10-15 ENCOUNTER — Ambulatory Visit: Payer: Medicare Other

## 2016-10-15 ENCOUNTER — Ambulatory Visit: Payer: Medicare Other | Admitting: Internal Medicine

## 2016-10-19 ENCOUNTER — Other Ambulatory Visit (INDEPENDENT_AMBULATORY_CARE_PROVIDER_SITE_OTHER): Payer: Medicare Other

## 2016-10-19 ENCOUNTER — Other Ambulatory Visit: Payer: Self-pay | Admitting: Internal Medicine

## 2016-10-19 DIAGNOSIS — E669 Obesity, unspecified: Secondary | ICD-10-CM

## 2016-10-19 DIAGNOSIS — I10 Essential (primary) hypertension: Secondary | ICD-10-CM

## 2016-10-19 DIAGNOSIS — Z125 Encounter for screening for malignant neoplasm of prostate: Secondary | ICD-10-CM

## 2016-10-19 DIAGNOSIS — E1169 Type 2 diabetes mellitus with other specified complication: Secondary | ICD-10-CM | POA: Diagnosis not present

## 2016-10-19 LAB — LIPID PANEL
Cholesterol: 103 mg/dL (ref 0–200)
HDL: 33.6 mg/dL — ABNORMAL LOW (ref >39.00)
LDL CALC: 43 mg/dL (ref 0–99)
NonHDL: 68.93
TRIGLYCERIDES: 129 mg/dL (ref 0.0–149.0)
Total CHOL/HDL Ratio: 3
VLDL: 25.8 mg/dL (ref 0.0–40.0)

## 2016-10-19 LAB — COMPREHENSIVE METABOLIC PANEL
ALT: 30 U/L (ref 0–53)
AST: 18 U/L (ref 0–37)
Albumin: 4.3 g/dL (ref 3.5–5.2)
Alkaline Phosphatase: 53 U/L (ref 39–117)
BUN: 14 mg/dL (ref 6–23)
CHLORIDE: 99 meq/L (ref 96–112)
CO2: 29 mEq/L (ref 19–32)
Calcium: 9.4 mg/dL (ref 8.4–10.5)
Creatinine, Ser: 1.17 mg/dL (ref 0.40–1.50)
GFR: 65.18 mL/min (ref >60.00)
Glucose, Bld: 178 mg/dL — ABNORMAL HIGH (ref 70–99)
POTASSIUM: 4.2 meq/L (ref 3.5–5.1)
SODIUM: 134 meq/L — AB (ref 135–145)
TOTAL PROTEIN: 6.5 g/dL (ref 6.0–8.3)
Total Bilirubin: 1.2 mg/dL (ref 0.2–1.2)

## 2016-10-19 LAB — PSA, MEDICARE: PSA: 1.1 ng/mL (ref 0.10–4.00)

## 2016-10-19 LAB — HEMOGLOBIN A1C: HEMOGLOBIN A1C: 6.9 % — AB (ref 4.6–6.5)

## 2016-10-25 ENCOUNTER — Encounter: Payer: Self-pay | Admitting: Internal Medicine

## 2016-11-03 ENCOUNTER — Other Ambulatory Visit: Payer: Self-pay | Admitting: Internal Medicine

## 2016-11-03 DIAGNOSIS — I10 Essential (primary) hypertension: Secondary | ICD-10-CM

## 2017-01-09 ENCOUNTER — Other Ambulatory Visit: Payer: Self-pay | Admitting: Internal Medicine

## 2017-01-29 ENCOUNTER — Other Ambulatory Visit: Payer: Self-pay | Admitting: Internal Medicine

## 2017-03-01 IMAGING — DX DG CHEST 2V
2 series · 2 of 2 positions shown · non-contrast
Comparison: 06/16/2012

CLINICAL DATA: history of remote tobacco abuse, 4 month histoyr of
dry cough

EXAM:
CHEST  2 VIEW

[chest pa]
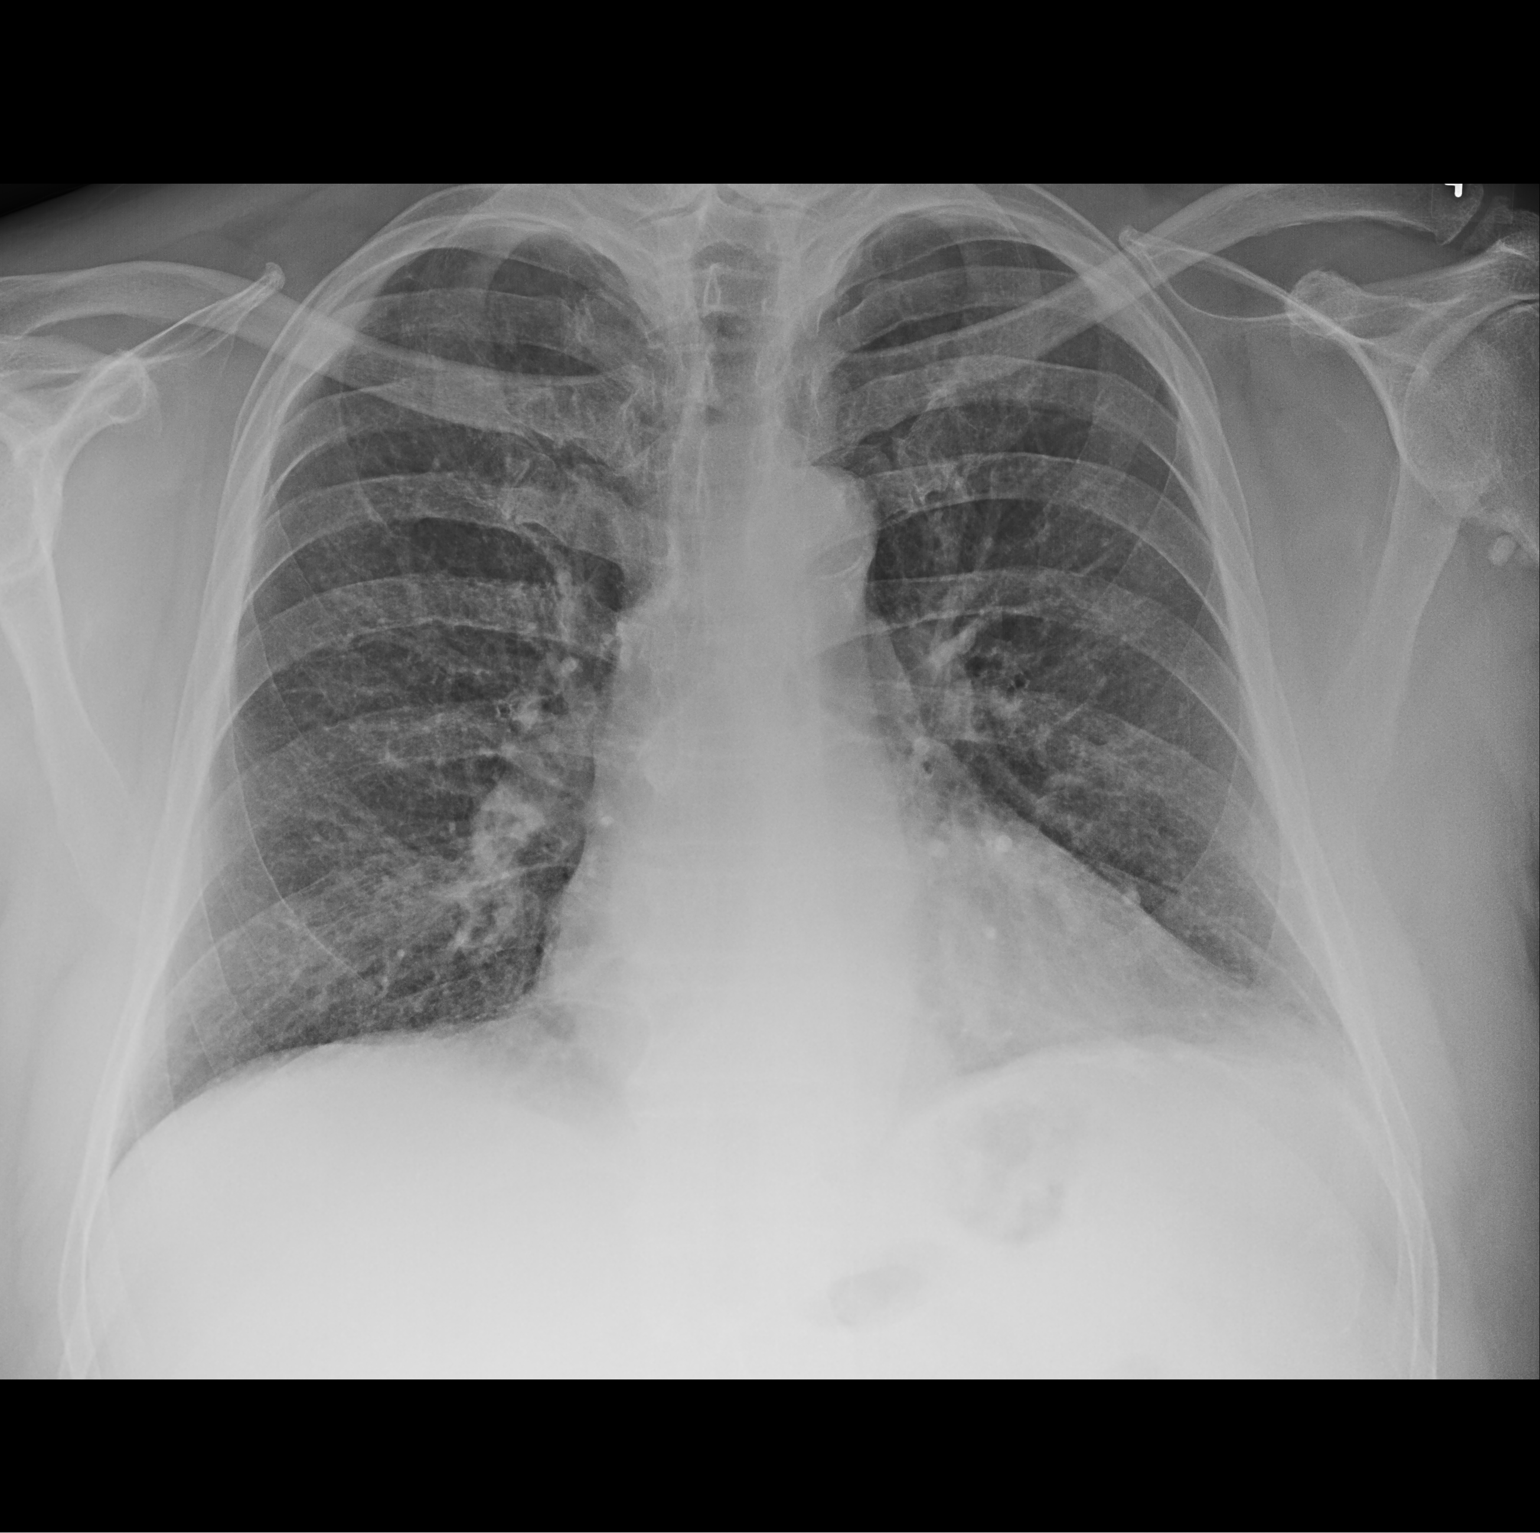

[chest lat]
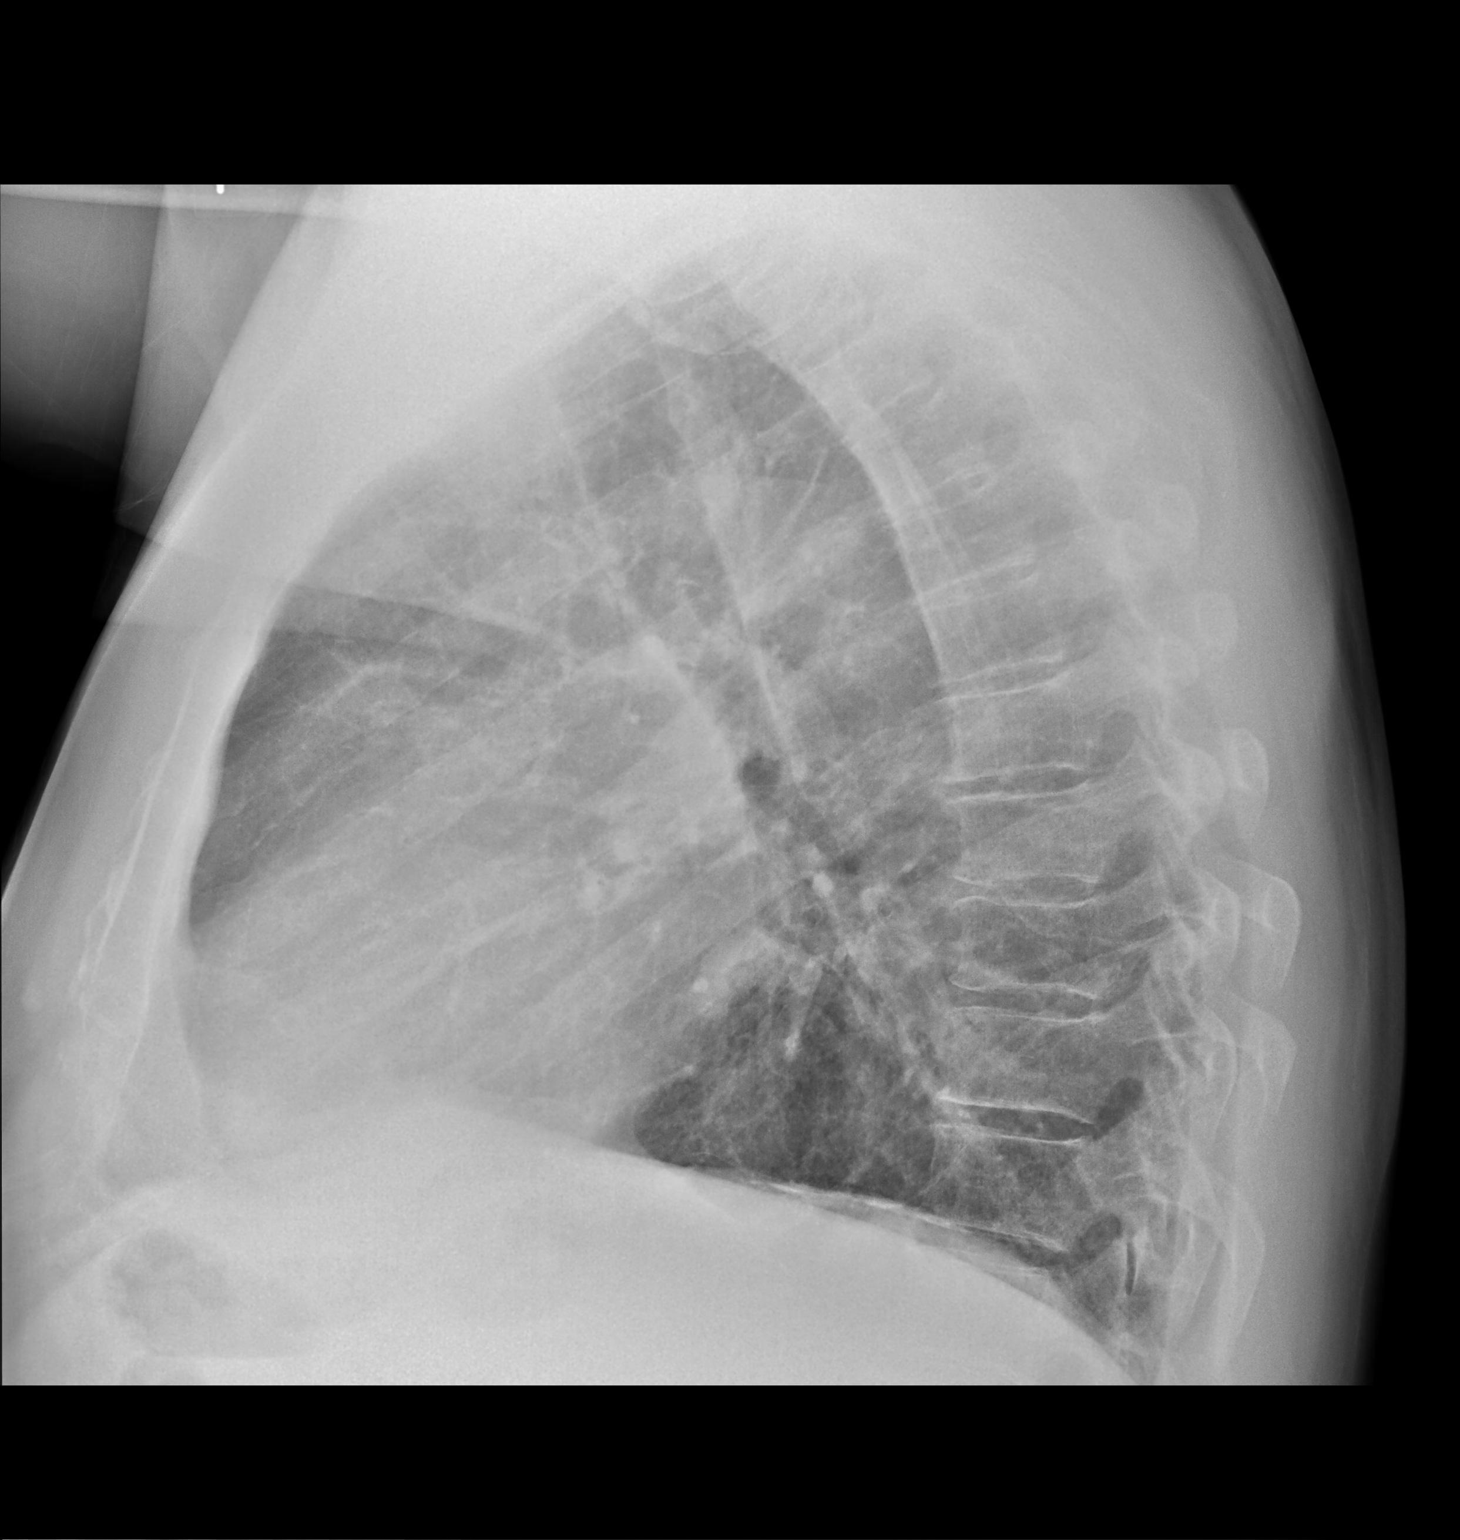

[2 of 2 positions shown; findings below may reference images not displayed]

FINDINGS: Cardiac silhouette is normal in size and configuration. No
mediastinal or hilar masses. No evidence of adenopathy.

Lungs are clear. There is partly calcified pleural plaque along the
left hemidiaphragm. No pleural effusion or pneumothorax.

Skeletal structures are intact.
IMPRESSION: No active cardiopulmonary disease.

## 2017-04-12 ENCOUNTER — Other Ambulatory Visit: Payer: Self-pay | Admitting: Internal Medicine

## 2017-04-12 DIAGNOSIS — I10 Essential (primary) hypertension: Secondary | ICD-10-CM

## 2017-04-25 ENCOUNTER — Encounter: Payer: Self-pay | Admitting: Internal Medicine

## 2017-04-25 ENCOUNTER — Ambulatory Visit (INDEPENDENT_AMBULATORY_CARE_PROVIDER_SITE_OTHER): Payer: Medicare Other | Admitting: Internal Medicine

## 2017-04-25 VITALS — BP 132/82 | HR 79 | Temp 98.0°F | Resp 15 | Ht 69.0 in | Wt 236.4 lb

## 2017-04-25 DIAGNOSIS — E785 Hyperlipidemia, unspecified: Secondary | ICD-10-CM

## 2017-04-25 DIAGNOSIS — I10 Essential (primary) hypertension: Secondary | ICD-10-CM

## 2017-04-25 DIAGNOSIS — Z23 Encounter for immunization: Secondary | ICD-10-CM | POA: Diagnosis not present

## 2017-04-25 DIAGNOSIS — E669 Obesity, unspecified: Secondary | ICD-10-CM

## 2017-04-25 DIAGNOSIS — K760 Fatty (change of) liver, not elsewhere classified: Secondary | ICD-10-CM | POA: Diagnosis not present

## 2017-04-25 DIAGNOSIS — E1169 Type 2 diabetes mellitus with other specified complication: Secondary | ICD-10-CM | POA: Diagnosis not present

## 2017-04-25 LAB — MICROALBUMIN / CREATININE URINE RATIO
Creatinine,U: 81.8 mg/dL
MICROALB/CREAT RATIO: 0.9 mg/g (ref 0.0–30.0)

## 2017-04-25 LAB — COMPREHENSIVE METABOLIC PANEL
ALBUMIN: 4.4 g/dL (ref 3.5–5.2)
ALK PHOS: 56 U/L (ref 39–117)
ALT: 33 U/L (ref 0–53)
AST: 22 U/L (ref 0–37)
BUN: 14 mg/dL (ref 6–23)
CHLORIDE: 99 meq/L (ref 96–112)
CO2: 28 mEq/L (ref 19–32)
Calcium: 9.7 mg/dL (ref 8.4–10.5)
Creatinine, Ser: 1.23 mg/dL (ref 0.40–1.50)
GFR: 61.43 mL/min (ref >60.00)
GLUCOSE: 293 mg/dL — AB (ref 70–99)
POTASSIUM: 5.2 meq/L — AB (ref 3.5–5.1)
SODIUM: 135 meq/L (ref 135–145)
Total Bilirubin: 1.3 mg/dL — ABNORMAL HIGH (ref 0.2–1.2)
Total Protein: 7.1 g/dL (ref 6.0–8.3)

## 2017-04-25 LAB — LIPID PANEL
Cholesterol: 103 mg/dL (ref 0–200)
HDL: 41.8 mg/dL (ref >39.00)
LDL Cholesterol: 25 mg/dL (ref 0–99)
NonHDL: 60.86
TRIGLYCERIDES: 181 mg/dL — AB (ref 0.0–149.0)
Total CHOL/HDL Ratio: 2
VLDL: 36.2 mg/dL (ref 0.0–40.0)

## 2017-04-25 LAB — HEMOGLOBIN A1C: HEMOGLOBIN A1C: 8.9 % — AB (ref 4.6–6.5)

## 2017-04-25 NOTE — Patient Instructions (Signed)
Good to see you!  I am referring you to Dr Lucilla Lame to manage your fatty liver .  He is a liver specialist at [facility]  I will see you in 6 months

## 2017-04-25 NOTE — Progress Notes (Signed)
Subjective:  Patient ID: Chad Gill, male    DOB: April 28, 1945  Age: 72 y.o. MRN: [id number]  CC: The primary encounter diagnosis was Fatty liver disease, nonalcoholic. Diagnoses of Diabetes mellitus type 2 in obese [facility]), Need for 23-polyvalent pneumococcal polysaccharide vaccine, Hyperlipidemia associated with type 2 diabetes mellitus ([place]), and Essential hypertension were also pertinent to this visit.  HPI Chad Gill presents for 6 month follow up on type 2 DM,  Cirrhosis secondary to NASH  Patient does not check blood sugars more than once a month,  Last one was 135 a month ago in a fasting state.  Does not recall any above 200 lr less than 80.  No complaints today.  Taking his medications as directed,  Not exercising on a regular basis or trying to lose weight.  Patient voices awareness  of the foods he/she needs to avoid,  And follows a low GI diet about 50% of the time.  Has not had an annual diabetic eye exam.  Denies numbness and tingling in lower extremities.  Denies hypoglycemic symptoms.   NAFLD:  Has fibroscan in 2017 and  Normal alpha feto protein;  Has declined biopsy . No longer seeing Gerald Dexter at [place].   Colonoscopy   [facility] 2016  Last eye exam sept 2018  Outpatient Medications Prior to Visit  Medication Sig Dispense Refill  . Ascorbic Acid (C-500/ROSE HIPS PO) Take 1 tablet by mouth daily.    Marland Kitchen atorvastatin (LIPITOR) 20 MG tablet TAKE 1 TABLET BY MOUTH EVERY DAY 90 tablet 3  . Coenzyme Q10 (HM COQ10) 100 MG capsule Take 100 mg by mouth daily.    . Garlic 9735 MG CAPS Take by mouth.    Marland Kitchen glucosamine-chondroitin 500-400 MG tablet Take 1 tablet by mouth 3 (three) times daily.    Marland Kitchen losartan (COZAAR) 25 MG tablet TAKE 1 TABLET BY MOUTH EVERY DAY 90 tablet 2  . losartan (COZAAR) 50 MG tablet TAKE 1 TABLET BY MOUTH EVERY DAY 90 tablet 1  . metFORMIN (GLUCOPHAGE-XR) 500 MG 24 hr tablet TAKE 1 TABLET BY MOUTH EVERY DAY WITH BREAKFAST 90 tablet 1  . Multiple Vitamins-Minerals  (MULTIVITAMIN WITH MINERALS) tablet Take 1 tablet by mouth daily.    . nizatidine (AXID) 300 MG capsule TAKE 1 CAPSULE BY MOUTH AT BEDTIME. 90 capsule 1  . Omega-3 Fatty Acids (FISH OIL) 1000 MG CAPS Take 1,000 mg by mouth daily.    Marland Kitchen omeprazole (PRILOSEC) 20 MG capsule TAKE 1 CAPSULE TWICE A DAY 180 capsule 2   No facility-administered medications prior to visit.     Review of Systems;  Patient denies headache, fevers, malaise, unintentional weight loss, skin rash, eye pain, sinus congestion and sinus pain, sore throat, dysphagia,  hemoptysis , cough, dyspnea, wheezing, chest pain, palpitations, orthopnea, edema, abdominal pain, nausea, melena, diarrhea, constipation, flank pain, dysuria, hematuria, urinary  Frequency, nocturia, numbness, tingling, seizures,  Focal weakness, Loss of consciousness,  Tremor, insomnia, depression, anxiety, and suicidal ideation.      Objective:  BP 132/82 (BP Location: Left Arm, Patient Position: Sitting, Cuff Size: Large)   Pulse 79   Temp 98 F (36.7 C) (Oral)   Resp 15   Ht 5\' 9"  (1.753 m)   Wt 236 lb 6.4 oz (107.2 kg)   SpO2 95%   BMI 34.91 kg/m   BP Readings from Last 3 Encounters:  04/25/17 132/82  10/05/16 (!) 148/78  03/26/16 (!) 150/84    Wt Readings from Last 3 Encounters:  04/25/17 236 lb 6.4 oz (107.2 kg)  10/05/16 240 lb 9.6 oz (109.1 kg)  03/26/16 236 lb (107 kg)    General appearance: alert, cooperative and appears stated age Ears: normal TM's and external ear canals both ears Throat: lips, mucosa, and tongue normal; teeth and gums normal Neck: no adenopathy, no carotid bruit, supple, symmetrical, trachea midline and thyroid not enlarged, symmetric, no tenderness/mass/nodules Back: symmetric, no curvature. ROM normal. No CVA tenderness. Lungs: clear to auscultation bilaterally Heart: regular rate and rhythm, S1, S2 normal, no murmur, click, rub or gallop Abdomen: soft, non-tender; bowel sounds normal; no masses,  no  organomegaly Pulses: 2+ and symmetric Skin: Skin color, texture, turgor normal. No rashes or lesions Lymph nodes: Cervical, supraclavicular, and axillary nodes normal.  Lab Results  Component Value Date   HGBA1C 8.9 (H) 04/25/2017   HGBA1C 6.9 (H) 10/19/2016   HGBA1C 6.4 03/26/2016    Lab Results  Component Value Date   CREATININE 1.23 04/25/2017   CREATININE 1.17 10/19/2016   CREATININE 1.00 03/26/2016    Lab Results  Component Value Date   WBC 6.7 10/14/2015   HGB 15.0 10/14/2015   HCT 43.5 10/14/2015   PLT 183.0 10/14/2015   GLUCOSE 293 (H) 04/25/2017   CHOL 103 04/25/2017   TRIG 181.0 (H) 04/25/2017   HDL 41.80 04/25/2017   LDLDIRECT 59.0 03/26/2016   LDLCALC 25 04/25/2017   ALT 33 04/25/2017   AST 22 04/25/2017   NA 135 04/25/2017   K 5.2 (H) 04/25/2017   CL 99 04/25/2017   CREATININE 1.23 04/25/2017   BUN 14 04/25/2017   CO2 28 04/25/2017   PSA 1.10 10/19/2016   HGBA1C 8.9 (H) 04/25/2017   MICROALBUR <0.7 04/25/2017    Assessment & Plan:   Problem List Items Addressed This Visit    Diabetes mellitus type 2 in obese ([place])    Significant loss of control despite compliance with medication  of  metformin XR 500 mg daily.  . . Patient is not up-to-date on eye exams but foot exam is normal today. Patient has no microalbuminuria. He does not check his sugars but will need to check them twice daily  And start Jardiance . Lab Results  Component Value Date   HGBA1C 8.9 (H) 04/25/2017   Lab Results  Component Value Date   MICROALBUR <0.7 04/25/2017               Relevant Medications   empagliflozin (JARDIANCE) 10 MG TABS tablet   Other Relevant Orders   Hemoglobin A1c (Completed)   Lipid panel (Completed)   Microalbumin / creatinine urine ratio (Completed)   Fatty liver disease, nonalcoholic - Primary    Referring to Lucilla Lame for follow up .  Lab Results  Component Value Date   ALT 33 04/25/2017   AST 22 04/25/2017   ALKPHOS 56 04/25/2017     BILITOT 1.3 (H) 04/25/2017         Relevant Orders   Ambulatory referral to Gastroenterology   Comprehensive metabolic panel (Completed)   Hyperlipidemia associated with type 2 diabetes mellitus ([place])    LDL and triglycerides are at goal on current medications. He has no side effects and liver enzymes are normal. No changes today   Lab Results  Component Value Date   CHOL 103 04/25/2017   HDL 41.80 04/25/2017   LDLCALC 25 04/25/2017   LDLDIRECT 59.0 03/26/2016   TRIG 181.0 (H) 04/25/2017   CHOLHDL 2 04/25/2017   Lab Results  Component Value Date   ALT 33 04/25/2017   AST 22 04/25/2017   ALKPHOS 56 04/25/2017   BILITOT 1.3 (H) 04/25/2017         Relevant Medications   empagliflozin (JARDIANCE) 10 MG TABS tablet   Hypertension    Well controlled on current regimen. Renal function stable, but potassium is mildly elevated..  Will need to repeat BMET next week.   Lab Results  Component Value Date   CREATININE 1.23 04/25/2017   Lab Results  Component Value Date   NA 135 04/25/2017   K 5.2 (H) 04/25/2017   CL 99 04/25/2017   CO2 28 04/25/2017          Other Visit Diagnoses    Need for 23-polyvalent pneumococcal polysaccharide vaccine       Relevant Orders   Pneumococcal polysaccharide vaccine 23-valent greater than or equal to 2yo subcutaneous/IM (Completed)      I am having Jori Moll E. Loura Back "Ron" start on empagliflozin. I am also having him maintain his Garlic, Coenzyme M76, multivitamin with minerals, Ascorbic Acid (C-500/ROSE HIPS PO), Fish Oil, glucosamine-chondroitin, omeprazole, losartan, nizatidine, atorvastatin, metFORMIN, and losartan.  Meds ordered this encounter  Medications  . empagliflozin (JARDIANCE) 10 MG TABS tablet    Sig: Take 10 mg by mouth daily.    Dispense:  30 tablet    Refill:  0    There are no discontinued medications.  Follow-up: Return in about 6 months (around 10/26/2017).   Crecencio Mc, MD

## 2017-04-26 ENCOUNTER — Ambulatory Visit: Payer: Self-pay | Admitting: *Deleted

## 2017-04-26 NOTE — Telephone Encounter (Signed)
Pneumovax received 04/25/17, redness pain at site. Given home care advise better today than yesterday.

## 2017-04-26 NOTE — Telephone Encounter (Signed)
Patient's wife calling to report reaction at vaccine site. Symptoms are improved today from yesterday- but still present. Discussed home care and signs of infection- they will monitor and call back if any changes are noticed.  Reason for Disposition . Pneumococcal vaccine reactions  Answer Assessment - Initial Assessment Questions 1. SYMPTOMS: "What is the main symptom?" (e.g., redness, swelling, pain)      Pain and rash - swelling at site- L arm 2. ONSET: "When was the vaccine (shot) given?" "How much later did the __________ begin?" (e.g., hours, days ago)      Yesterday am- symptoms started couple hours after vaccine 3. SEVERITY: "How bad is it?"      Initially patient had sore- raised hard area- red 4. FEVER: "Is there a fever?" If so, ask: "What is it, how was it measured, and when did it start?"      unknown 5. IMMUNIZATIONS GIVEN: "What shots have you recently received?"     Last of combo 6. PAST REACTIONS: "Have you reacted to immunizations before?" If so, ask: "What happened?"     Patient has had rash with flu vaccine before- all over body 7. OTHER SYMPTOMS: "Do you have any other symptoms?"     Just local at site  Protocols used: IMMUNIZATION REACTIONS-A-AH

## 2017-04-27 MED ORDER — EMPAGLIFLOZIN 10 MG PO TABS: 10.0000 mg | ORAL_TABLET | Freq: Every day | ORAL | 0 refills | Status: DC

## 2017-04-27 NOTE — Assessment & Plan Note (Signed)
Referring to Chad Gill for follow up .  Lab Results  Component Value Date   ALT 33 04/25/2017   AST 22 04/25/2017   ALKPHOS 56 04/25/2017   BILITOT 1.3 (H) 04/25/2017

## 2017-04-27 NOTE — Assessment & Plan Note (Addendum)
Significant loss of control despite compliance with medication  of  metformin XR 500 mg daily.  . . Patient is not up-to-date on eye exams but foot exam is normal today. Patient has no microalbuminuria. He does not check his sugars but will need to check them twice daily  And start Jardiance . Lab Results  Component Value Date   HGBA1C 8.9 (H) 04/25/2017   Lab Results  Component Value Date   MICROALBUR <0.7 04/25/2017

## 2017-04-27 NOTE — Assessment & Plan Note (Signed)
LDL and triglycerides are at goal on current medications. He has no side effects and liver enzymes are normal. No changes today   Lab Results  Component Value Date   CHOL 103 04/25/2017   HDL 41.80 04/25/2017   LDLCALC 25 04/25/2017   LDLDIRECT 59.0 03/26/2016   TRIG 181.0 (H) 04/25/2017   CHOLHDL 2 04/25/2017   Lab Results  Component Value Date   ALT 33 04/25/2017   AST 22 04/25/2017   ALKPHOS 56 04/25/2017   BILITOT 1.3 (H) 04/25/2017

## 2017-04-27 NOTE — Assessment & Plan Note (Signed)
Well controlled on current regimen. Renal function stable, but potassium is mildly elevated..  Will need to repeat BMET next week.   Lab Results  Component Value Date   CREATININE 1.23 04/25/2017   Lab Results  Component Value Date   NA 135 04/25/2017   K 5.2 (H) 04/25/2017   CL 99 04/25/2017   CO2 28 04/25/2017

## 2017-04-29 ENCOUNTER — Ambulatory Visit: Payer: No Typology Code available for payment source | Admitting: Gastroenterology

## 2017-05-02 ENCOUNTER — Telehealth: Payer: Self-pay | Admitting: Internal Medicine

## 2017-05-02 MED ORDER — ONETOUCH ULTRASOFT LANCETS MISC: 12 refills | Status: DC

## 2017-05-02 MED ORDER — GLUCOSE BLOOD VI STRP: ORAL_STRIP | 12 refills | Status: DC

## 2017-05-02 NOTE — Telephone Encounter (Signed)
Copied from [place] [id number]. Topic: Quick Communication - Rx Refill/Question >> May 02, 2017  1:30 PM Robina Ade, Helene Kelp D wrote: Medication: one touch verio test stripsand one touch delica fine lancets 30 gauge   Has the patient contacted their pharmacy? No, needs a new rx send to his pharmacy   (Agent: If no, request that the patient contact the pharmacy for the refill.)   Preferred Pharmacy (with phone number or street name):[id number] [place], [place] - [address]   Agent: Please be advised that RX refills may take up to 3 business days. We ask that you follow-up with your pharmacy.

## 2017-05-02 NOTE — Telephone Encounter (Signed)
Scripts sent as requested

## 2017-05-02 NOTE — Telephone Encounter (Signed)
Pt requesting new prescription for diabetes supplies: one touch verio test strips and one touch delica fine lancets 30 gauge  LOV  04/25/17 NOV  06/03/17 Provider:  Deborra Medina, MD  [facility] [phone], [place], [place]

## 2017-05-06 NOTE — Telephone Encounter (Signed)
[facility] need a new order faxed over with DX code on it.   [id number] [place], [place] - [address]

## 2017-05-07 MED ORDER — ONETOUCH ULTRASOFT LANCETS MISC: 12 refills | Status: AC

## 2017-05-07 MED ORDER — GLUCOSE BLOOD VI STRP: ORAL_STRIP | 12 refills | Status: DC

## 2017-05-07 NOTE — Telephone Encounter (Signed)
Recent with DX code.

## 2017-05-15 ENCOUNTER — Other Ambulatory Visit: Payer: Self-pay

## 2017-05-15 MED ORDER — GLUCOSE BLOOD VI STRP: ORAL_STRIP | 12 refills | Status: DC

## 2017-05-25 ENCOUNTER — Other Ambulatory Visit: Payer: Self-pay | Admitting: Internal Medicine

## 2017-06-03 ENCOUNTER — Ambulatory Visit (INDEPENDENT_AMBULATORY_CARE_PROVIDER_SITE_OTHER): Payer: Medicare Other | Admitting: Internal Medicine

## 2017-06-03 ENCOUNTER — Encounter: Payer: Self-pay | Admitting: Internal Medicine

## 2017-06-03 ENCOUNTER — Telehealth: Payer: Self-pay | Admitting: Internal Medicine

## 2017-06-03 DIAGNOSIS — E1165 Type 2 diabetes mellitus with hyperglycemia: Secondary | ICD-10-CM

## 2017-06-03 DIAGNOSIS — R05 Cough: Secondary | ICD-10-CM

## 2017-06-03 DIAGNOSIS — R059 Cough, unspecified: Secondary | ICD-10-CM

## 2017-06-03 MED ORDER — GLUCOSE BLOOD VI STRP: ORAL_STRIP | 12 refills | Status: DC

## 2017-06-03 MED ORDER — EMPAGLIFLOZIN 25 MG PO TABS: 25.0000 mg | ORAL_TABLET | Freq: Every day | ORAL | 1 refills | Status: DC

## 2017-06-03 MED ORDER — HYDROCOD POLST-CPM POLST ER 10-8 MG/5ML PO SUER: 5.0000 mL | Freq: Every evening | ORAL | 0 refills | Status: DC | PRN

## 2017-06-03 NOTE — Patient Instructions (Addendum)
I HAVE REFILLED THE JARDIANCE AT THE HIGHER DOSE 25 MG DAILY    check your blood sugars 2 hours after oatmeal breakfast   Try the [place] Clap Eggwhich (sausage biscuit without the bread!)   Please check your blood sugars 2 hours after eating  .  PICK DIFFERENT MEALS EACH TIME.  SEND ME THE BLOOD SUGARS IN A FEW WEEKS SO I CAN REVIEW  Your cough may be from the pollen causing irritation or post nasal drip.  Try taking benadryl (dipenhydramine) 1 hour before bedtime   I have refilled the cough syrup    Return in 3 months

## 2017-06-03 NOTE — Telephone Encounter (Signed)
Copied from [place] [id number]. Topic: Quick Communication - See Telephone Encounter >> Jun 03, 2017  3:19 PM Conception Chancy, NT wrote: CRM for notification. See Telephone encounter for: 06/03/17.  Derald Macleod [facility] and states that Medicare part B does not cover glucose blood test strip. She states it only covers 100 test strips for 3 months for non insulin dependant patient. She states that the order says he is testing 2x a day. But in order for insurance to cover then it would need to say test 1x a day with a new script.  [id number] [place], [place] - [address] [place] [address] Phone: [phone] Fax: [phone]

## 2017-06-03 NOTE — Assessment & Plan Note (Addendum)
Has not been able to check sugars due to pharmacy /insurance complications ,   And has run out of jardiance.  Based on blood sugar log presented today,  wil resume jardiance at 25 mg dose.  Ideally needs to check sugars bid but insurance will not cover test strips for bid testing unless he is taking insulin .   RTC 3 months.

## 2017-06-03 NOTE — Progress Notes (Signed)
Subjective:  Patient ID: Chad Gill, male    DOB: 08-Mar-1945  Age: 72 y.o. MRN: [id number]  CC: Diagnoses of Uncontrolled type 2 diabetes mellitus with hyperglycemia ([place]) and Cough were pertinent to this visit.  HPI Chad Gill presents for diabetes follow up. Patient was seen one month ago for diabetes follow up and A1c reflected signicant  loss of control of diabetes on metformin alone.  jardiance was started last month at 10 mg daily , but he ran out of a 30 day supply a few days ago. It is unclear why he did not receive or request a refill.  He also ran out of test strips a month ago and despite numerous trips to the pharmacy , he has not been able to get test strips approved by his insurance,  He is understandably frustrated . He was tolerating the jardiance without side effects and blood sugar log indicates that his control was improving gradually  Before he ran out of strips.   2) he has been Coughing at night for the past 5 nights.  Taking nyquil .  "this is my annual cough"  Denies sneezing,  Rhinitis,  Shortness of breath,  Body aches  And fever.  No recent travel.     Lab Results  Component Value Date   HGBA1C 8.9 (H) 04/25/2017     Outpatient Medications Prior to Visit  Medication Sig Dispense Refill  . Ascorbic Acid (C-500/ROSE HIPS PO) Take 1 tablet by mouth daily.    Marland Kitchen atorvastatin (LIPITOR) 20 MG tablet TAKE 1 TABLET BY MOUTH EVERY DAY 90 tablet 3  . Coenzyme Q10 (HM COQ10) 100 MG capsule Take 100 mg by mouth daily.    . Garlic 8144 MG CAPS Take by mouth.    Marland Kitchen glucosamine-chondroitin 500-400 MG tablet Take 1 tablet by mouth 3 (three) times daily.    . Lancets (ONETOUCH ULTRASOFT) lancets Use as instructed 100 each 12  . losartan (COZAAR) 50 MG tablet TAKE 1 TABLET BY MOUTH EVERY DAY 90 tablet 1  . metFORMIN (GLUCOPHAGE-XR) 500 MG 24 hr tablet TAKE 1 TABLET BY MOUTH EVERY DAY WITH BREAKFAST 90 tablet 1  . Multiple Vitamins-Minerals (MULTIVITAMIN WITH MINERALS)  tablet Take 1 tablet by mouth daily.    . nizatidine (AXID) 300 MG capsule TAKE 1 CAPSULE BY MOUTH AT BEDTIME. 90 capsule 1  . Omega-3 Fatty Acids (FISH OIL) 1000 MG CAPS Take 1,000 mg by mouth daily.    Marland Kitchen omeprazole (PRILOSEC) 20 MG capsule TAKE 1 CAPSULE TWICE A DAY 180 capsule 2  . glucose blood test strip Use to check your sugars twice daily 100 each 12  . JARDIANCE 10 MG TABS tablet TAKE 1 TABLET BY MOUTH EVERY DAY 90 tablet 1  . losartan (COZAAR) 25 MG tablet TAKE 1 TABLET BY MOUTH EVERY DAY 90 tablet 2   No facility-administered medications prior to visit.     Review of Systems;  Patient denies headache, fevers, malaise, unintentional weight loss, skin rash, eye pain, sinus congestion and sinus pain, sore throat, dysphagia,  hemoptysis , dyspnea, wheezing, chest pain, palpitations, orthopnea, edema, abdominal pain, nausea, melena, diarrhea, constipation, flank pain, dysuria, hematuria, urinary  Frequency, nocturia, numbness, tingling, seizures,  Focal weakness, Loss of consciousness,  Tremor, insomnia, depression, anxiety, and suicidal ideation.      Objective:  BP (!) 148/76 (BP Location: Left Arm, Patient Position: Sitting, Cuff Size: Normal)   Pulse 78   Temp 98 F (36.7 C) (Oral)  Resp 15   Ht 5\' 9"  (1.753 m)   Wt 225 lb (102.1 kg)   SpO2 96%   BMI 33.23 kg/m   BP Readings from Last 3 Encounters:  06/03/17 (!) 148/76  04/25/17 132/82  10/05/16 (!) 148/78    Wt Readings from Last 3 Encounters:  06/03/17 225 lb (102.1 kg)  04/25/17 236 lb 6.4 oz (107.2 kg)  10/05/16 240 lb 9.6 oz (109.1 kg)    General appearance: alert, cooperative and appears stated age Ears: normal TM's and external ear canals both ears Throat: lips, mucosa, and tongue normal; teeth and gums normal Neck: no adenopathy, no carotid bruit, supple, symmetrical, trachea midline and thyroid not enlarged, symmetric, no tenderness/mass/nodules Back: symmetric, no curvature. ROM normal. No CVA  tenderness. Lungs: clear to auscultation bilaterally Heart: regular rate and rhythm, S1, S2 normal, no murmur, click, rub or gallop Abdomen: soft, non-tender; bowel sounds normal; no masses,  no organomegaly Pulses: 2+ and symmetric Skin: Skin color, texture, turgor normal. No rashes or lesions Lymph nodes: Cervical, supraclavicular, and axillary nodes normal.  Lab Results  Component Value Date   HGBA1C 8.9 (H) 04/25/2017   HGBA1C 6.9 (H) 10/19/2016   HGBA1C 6.4 03/26/2016    Lab Results  Component Value Date   CREATININE 1.23 04/25/2017   CREATININE 1.17 10/19/2016   CREATININE 1.00 03/26/2016    Lab Results  Component Value Date   WBC 6.7 10/14/2015   HGB 15.0 10/14/2015   HCT 43.5 10/14/2015   PLT 183.0 10/14/2015   GLUCOSE 293 (H) 04/25/2017   CHOL 103 04/25/2017   TRIG 181.0 (H) 04/25/2017   HDL 41.80 04/25/2017   LDLDIRECT 59.0 03/26/2016   LDLCALC 25 04/25/2017   ALT 33 04/25/2017   AST 22 04/25/2017   NA 135 04/25/2017   K 5.2 (H) 04/25/2017   CL 99 04/25/2017   CREATININE 1.23 04/25/2017   BUN 14 04/25/2017   CO2 28 04/25/2017   PSA 1.10 10/19/2016   HGBA1C 8.9 (H) 04/25/2017   MICROALBUR <0.7 04/25/2017    Dg Foot Complete Right  Result Date: 04/08/2014 CLINICAL DATA:  Heel pain with weight-bearing, no trauma EXAM: RIGHT FOOT COMPLETE - 3+ VIEW COMPARISON:  None. FINDINGS: Tarsal metatarsal alignment is normal. There is mild degenerative change involving the right first MTP joint with some loss of joint space. No erosion is seen. On the lateral view there are small degenerative spurs emanating from the plantar aspect of the calcaneus as well as the insertion of the Achilles tendon. IMPRESSION: Small degenerative calcaneal spurs. Mild degenerative change of the right first MTP joint. Electronically Signed   By: Ivar Drape M.D.   On: 04/08/2014 15:21    Assessment & Plan:   Problem List Items Addressed This Visit    Uncontrolled type 2 diabetes mellitus  ([place])    Has not been able to check sugars due to pharmacy /insurance complications ,   And has run out of jardiance.  Based on blood sugar log presented today,  wil resume jardiance at 25 mg dose.  Ideally needs to check sugars bid but insurance will not cover test strips for bid testing unless he is taking insulin .   RTC 3 months.       Relevant Medications   empagliflozin (JARDIANCE) 25 MG TABS tablet   Cough    Seasonal   Not improved with Nyquil. Lung exam normal.  Refill tussionex for 7 days,  Add benadryl qhs and daily antihistamine.  Relevant Medications   chlorpheniramine-HYDROcodone (TUSSIONEX PENNKINETIC ER) 10-8 MG/5ML SUER      I have discontinued Chad Gill. Chad "Ron"'s JARDIANCE. I have also changed his glucose blood. Additionally, I am having him start on empagliflozin. Lastly, I am having him maintain his Garlic, Coenzyme Q98, multivitamin with minerals, Ascorbic Acid (C-500/ROSE HIPS PO), Fish Oil, glucosamine-chondroitin, omeprazole, nizatidine, atorvastatin, metFORMIN, losartan, onetouch ultrasoft, and chlorpheniramine-HYDROcodone.  Meds ordered this encounter  Medications  . empagliflozin (JARDIANCE) 25 MG TABS tablet    Sig: Take 25 mg by mouth daily.    Dispense:  90 tablet    Refill:  1    DISREGARD PREVIOUS REFILL FOR 10 MG DOSE  . glucose blood test strip    Sig: Use to check blood sugars twice daily    Dispense:  100 each    Refill:  12    Needs test strips for one touch verio. Dx Code: E11.65  . chlorpheniramine-HYDROcodone (TUSSIONEX PENNKINETIC ER) 10-8 MG/5ML SUER    Sig: Take 5 mLs by mouth at bedtime as needed for cough.    Dispense:  140 mL    Refill:  0    Medications Discontinued During This Encounter  Medication Reason  . glucose blood test strip Reorder  . JARDIANCE 10 MG TABS tablet   . losartan (COZAAR) 25 MG tablet     Follow-up: Return in about 3 months (around 09/02/2017) for follow up diabetes.   Crecencio Mc, MD

## 2017-06-04 MED ORDER — GLUCOSE BLOOD VI STRP: ORAL_STRIP | 12 refills | Status: AC

## 2017-06-04 NOTE — Assessment & Plan Note (Addendum)
Seasonal   Not improved with Nyquil. Lung exam normal.  Refill tussionex for 7 days,  Add benadryl qhs and daily antihistamine.

## 2017-06-04 NOTE — Telephone Encounter (Signed)
[facility]'s insurance will not cover test strips for pt to test sugars BID. They will only cover for pt to test once daily since he is non insulin dependent. Is it ok to send in new script stating use to check sugars once daily?

## 2017-06-04 NOTE — Telephone Encounter (Signed)
yes

## 2017-06-04 NOTE — Telephone Encounter (Signed)
Resent with corrected directions per Dr. Lupita Dawn okay.

## 2017-06-10 ENCOUNTER — Encounter: Payer: Self-pay | Admitting: Gastroenterology

## 2017-06-10 ENCOUNTER — Ambulatory Visit (INDEPENDENT_AMBULATORY_CARE_PROVIDER_SITE_OTHER): Payer: Medicare Other | Admitting: Gastroenterology

## 2017-06-10 ENCOUNTER — Other Ambulatory Visit: Payer: Self-pay

## 2017-06-10 VITALS — BP 121/65 | HR 87 | Ht 69.0 in | Wt 210.5 lb

## 2017-06-10 DIAGNOSIS — K760 Fatty (change of) liver, not elsewhere classified: Secondary | ICD-10-CM | POA: Diagnosis not present

## 2017-06-11 NOTE — Progress Notes (Signed)
Gastroenterology Consultation  Referring Provider:     Crecencio Mc, MD Primary Care Physician:  Crecencio Mc, MD Primary Gastroenterologist:  Dr. Allen Norris     Reason for Consultation:     Fatty liver        HPI:   Chad Gill is a 72 y.o. y/o male referred for consultation & management of Fatty liver by Dr. Derrel Nip, Aris Everts, MD.  This patient comes today after being found to have fatty liver back in 2010.  The patient was seen at [facility] and was recommended to have a MRI and he states he liver biopsy. The patient had chronic elevation of his liver enzymes were all the way back to July 2014 through September 2015 but since then his liver enzymes have come back completely normal except for a intermittently high bilirubin but only slightly.  The patient reports that he has lost weight and has been trying to control his risk factors for his fatty liver. The patient had a colonoscopy in 2016 at [facility] and a liver ultrasound in December 2016.  At that time was recommended that he have a MRI.  This is recommended due to an abnormality seen in the liver.  The patient denies any black stools bloody stools abdominal pain nausea vomiting fevers or chills.  Past Medical History:  Diagnosis Date  . Barrett's esophagus 2004   last EGD 2007, no metaplasia, due in 2009  . Diabetes mellitus   . Fatty liver disease, nonalcoholic   . GERD (gastroesophageal reflux disease)   . Hyperplastic colonic polyp 2004  . Lumbar disc herniation 2007   L5  . Transaminitis     Past Surgical History:  Procedure Laterality Date  . SMALL INTESTINE SURGERY     carcinoid tumore, diagnosis disputed  . TONSILLECTOMY  age 53    Prior to Admission medications   Medication Sig Start Date End Date Taking? Authorizing Provider  Ascorbic Acid (C-500/ROSE HIPS PO) Take 1 tablet by mouth daily.   Yes [provider]  atorvastatin (LIPITOR) 20 MG tablet TAKE 1 TABLET BY MOUTH EVERY DAY 01/30/17  Yes Crecencio Mc,  MD  Coenzyme Q10 (HM COQ10) 100 MG capsule Take 100 mg by mouth daily.   Yes [provider]  empagliflozin (JARDIANCE) 25 MG TABS tablet Take 25 mg by mouth daily. 06/03/17  Yes Crecencio Mc, MD  Garlic 4270 MG CAPS Take by mouth.   Yes [provider]  glucosamine-chondroitin 500-400 MG tablet Take 1 tablet by mouth 3 (three) times daily.   Yes [provider]  glucose blood test strip Use to check blood sugars once daily 06/04/17  Yes Crecencio Mc, MD  Lancets [facility] ULTRASOFT) lancets Use as instructed 05/07/17  Yes Crecencio Mc, MD  losartan (COZAAR) 50 MG tablet TAKE 1 TABLET BY MOUTH EVERY DAY 04/12/17  Yes Crecencio Mc, MD  metFORMIN (GLUCOPHAGE-XR) 500 MG 24 hr tablet TAKE 1 TABLET BY MOUTH EVERY DAY WITH BREAKFAST 04/12/17  Yes Crecencio Mc, MD  nizatidine (AXID) 300 MG capsule TAKE 1 CAPSULE BY MOUTH AT BEDTIME. 01/09/17  Yes Crecencio Mc, MD  Omega-3 Fatty Acids (FISH OIL) 1000 MG CAPS Take 1,000 mg by mouth daily.   Yes [provider]  omeprazole (PRILOSEC) 20 MG capsule TAKE 1 CAPSULE TWICE A DAY 04/04/16  Yes Crecencio Mc, MD  chlorpheniramine-HYDROcodone (TUSSIONEX PENNKINETIC ER) 10-8 MG/5ML SUER Take 5 mLs by mouth at bedtime as  needed for cough. Patient not taking: Reported on 06/10/2017 06/03/17   Crecencio Mc, MD  Multiple Vitamins-Minerals (MULTIVITAMIN WITH MINERALS) tablet Take 1 tablet by mouth daily.    [provider]    Family History  Problem Relation Age of Onset  . Hypertension Mother      Social History   Tobacco Use  . Smoking status: Former Smoker    Types: Cigarettes    Last attempt to quit: 01/21/1986    Years since quitting: 31.4  . Smokeless tobacco: Never Used  Substance Use Topics  . Alcohol use: Yes    Comment: ocassional beer  . Drug use: No    Allergies as of 06/10/2017 - Review Complete 06/10/2017  Allergen Reaction Noted  . Other Hives 11/21/2011  . Influenza vac split  [flu virus vaccine] Hives 04/08/2014    Review of Systems:    All systems reviewed and negative except where noted in HPI.   Physical Exam:  BP 121/65   Pulse 87   Ht 5\' 9"  (1.753 m)   Wt 210 lb 8 oz (95.5 kg)   BMI 31.09 kg/m  No LMP for male patient. Psych:  Alert and cooperative. Normal mood and affect. General:   Alert,  Well-developed, well-nourished, pleasant and cooperative in NAD Head:  Normocephalic and atraumatic. Eyes:  Sclera clear, no icterus.   Conjunctiva pink. Ears:  Normal auditory acuity. Nose:  No deformity, discharge, or lesions. Mouth:  No deformity or lesions,oropharynx pink & moist. Neck:  Supple; no masses or thyromegaly. Lungs:  Respirations even and unlabored.  Clear throughout to auscultation.   No wheezes, crackles, or rhonchi. No acute distress. Heart:  Regular rate and rhythm; no murmurs, clicks, rubs, or gallops. Abdomen:  Normal bowel sounds.  No bruits.  Soft, non-tender and non-distended without masses, hepatosplenomegaly or hernias noted.  No guarding or rebound tenderness.  Negative Carnett sign.   Rectal:  Deferred.  Msk:  Symmetrical without gross deformities.  Good, equal movement & strength bilaterally. Pulses:  Normal pulses noted. Extremities:  No clubbing or edema.  No cyanosis. Neurologic:  Alert and oriented x3;  grossly normal neurologically. Skin:  Intact without significant lesions or rashes.  No jaundice. Lymph Nodes:  No significant cervical adenopathy. Psych:  Alert and cooperative. Normal mood and affect.  Imaging Studies: No results found.  Assessment and Plan:   Chad Gill is a 72 y.o. y/o male who comes in today after being found to have fatty liver back in 2010 with abnormal liver enzymes that have normalized over the last few years. The patient has been encouraged to undergo an MRI of that lesion in the liver due to its abnormal appearance on the ultrasound.  As for the patient's fatty liver, there is no elevated  liver enzymes meaning that the steatosis is not causing steatohepatitis and needs no further intervention at this time besides what the patient is already doing.  The patient has been explained the plan and agrees with it.  Lucilla Lame, MD. Marval Regal   Note: This dictation was prepared with Dragon dictation along with smaller phrase technology. Any transcriptional errors that result from this process are unintentional.

## 2017-06-17 ENCOUNTER — Telehealth: Payer: Self-pay

## 2017-06-17 ENCOUNTER — Other Ambulatory Visit: Payer: Self-pay

## 2017-06-17 DIAGNOSIS — K760 Fatty (change of) liver, not elsewhere classified: Secondary | ICD-10-CM

## 2017-06-17 MED ORDER — DIAZEPAM 5 MG PO TABS: ORAL_TABLET | ORAL | 0 refills | Status: DC

## 2017-06-17 NOTE — Telephone Encounter (Signed)
I spoke with pt and wife: MRI scheduled for [place] 06/21/2017 at 8:45 am, arrival time 8:15 am. When I spoke with scheduling there was discrepancy regarding the questions that were clarified by patient (phone call): no allergy to IV contrast, no implanted devices, and no eye injuries with metal.  Pt also would like something for claustrophobia the day of MRI, although he is not sure he wants to take it.   I spoke with Dr. Dorothey Baseman nurse and he prescribes Valium 5mg  po #1, no refills, to take 1 hr prior to procedure. I advised that if I ordered this he would need to take it. Pt/wife notifed. Also contacted scheduling to let them know of the sedation. RX sent to pharmacy.

## 2017-06-17 NOTE — Telephone Encounter (Signed)
Left message (per pt okay) that MRI is scheduled on May 3 at [facility] (per pt request) at 8am, arrival time 7:30am and nothing to eat or drink after midnight the night before procedure. Left message also if he needs any medication for anxiety for the MRI contact office. This unit at [facility] is a closed unit. The mobile MRI that goes to [place] is larger than [facility] and if he would like location switched, otherwise we will leave things as they are.

## 2017-06-21 ENCOUNTER — Ambulatory Visit: Payer: Medicare Other

## 2017-06-21 ENCOUNTER — Ambulatory Visit
Admission: RE | Admit: 2017-06-21 | Discharge: 2017-06-21 | Disposition: A | Discharge status: Home / Self Care | Payer: Medicare Other | Source: Ambulatory Visit | Attending: Gastroenterology | Admitting: Gastroenterology

## 2017-06-21 DIAGNOSIS — K7689 Other specified diseases of liver: Secondary | ICD-10-CM | POA: Diagnosis not present

## 2017-06-21 DIAGNOSIS — K760 Fatty (change of) liver, not elsewhere classified: Secondary | ICD-10-CM | POA: Diagnosis not present

## 2017-06-21 MED ORDER — GADOBENATE DIMEGLUMINE 529 MG/ML IV SOLN
20.0000 mL | Freq: Once | INTRAVENOUS | Status: AC | PRN
  Administered 2017-06-21: 20 mL via INTRAVENOUS

## 2017-06-24 ENCOUNTER — Telehealth: Payer: Self-pay

## 2017-06-24 NOTE — Telephone Encounter (Signed)
LVM for pt to return my call.

## 2017-06-24 NOTE — Telephone Encounter (Signed)
-----   Message from Lucilla Lame, MD sent at 06/24/2017  3:02 PM EDT ----- Let the patient know that his MRI did not show any masses or worrisome lesions.  There was some fatty liver and small cysts but everything appeared benign.

## 2017-06-25 NOTE — Telephone Encounter (Signed)
Pt notified of MRI results

## 2017-07-02 ENCOUNTER — Other Ambulatory Visit: Payer: Self-pay | Admitting: Internal Medicine

## 2017-07-03 ENCOUNTER — Other Ambulatory Visit: Payer: Self-pay | Admitting: Internal Medicine

## 2017-07-26 ENCOUNTER — Telehealth: Payer: Self-pay | Admitting: Internal Medicine

## 2017-07-26 NOTE — Telephone Encounter (Signed)
Declines AWV. Do not call again

## 2017-08-01 DIAGNOSIS — H903 Sensorineural hearing loss, bilateral: Secondary | ICD-10-CM | POA: Diagnosis not present

## 2017-09-02 ENCOUNTER — Ambulatory Visit (INDEPENDENT_AMBULATORY_CARE_PROVIDER_SITE_OTHER): Payer: Medicare Other | Admitting: Internal Medicine

## 2017-09-02 ENCOUNTER — Encounter: Payer: Self-pay | Admitting: Internal Medicine

## 2017-09-02 VITALS — BP 138/76 | HR 83 | Temp 98.5°F | Resp 14 | Ht 69.0 in | Wt 223.2 lb

## 2017-09-02 DIAGNOSIS — E119 Type 2 diabetes mellitus without complications: Secondary | ICD-10-CM | POA: Diagnosis not present

## 2017-09-02 DIAGNOSIS — K7460 Unspecified cirrhosis of liver: Secondary | ICD-10-CM

## 2017-09-02 DIAGNOSIS — I10 Essential (primary) hypertension: Secondary | ICD-10-CM

## 2017-09-02 DIAGNOSIS — E669 Obesity, unspecified: Secondary | ICD-10-CM | POA: Diagnosis not present

## 2017-09-02 DIAGNOSIS — E1165 Type 2 diabetes mellitus with hyperglycemia: Secondary | ICD-10-CM | POA: Diagnosis not present

## 2017-09-02 LAB — POCT GLYCOSYLATED HEMOGLOBIN (HGB A1C): HEMOGLOBIN A1C: 6.6 % — AB (ref 4.0–5.6)

## 2017-09-02 NOTE — Progress Notes (Signed)
Subjective:  Patient ID: Chad Gill, male    DOB: 1945-09-02  Age: 72 y.o. MRN: [id number]  CC: The primary encounter diagnosis was Uncontrolled type 2 diabetes mellitus with hyperglycemia ([place]). Diagnoses of Diabetes mellitus without complication ([place]), Cirrhosis of liver without ascites, unspecified hepatic cirrhosis type ([place]), and Obesity (BMI 30-39.9) were also pertinent to this visit.  HPI Chad Gill presents for follow up on type 2 DM  Noted at last visit in March.[name] was  Prescribed   Again but never restarted by patient due to concerns about  safety.   Has been taking just  metfomrin  500 mg xr   No fastings to report. 2 hr post prandials  122 to 180  Mostly under 160   Exercising for 15 minutes on an Ellipitiglider .  No chest  Pain.  Has lost 2 lbs since last visit.   Lab Results  Component Value Date   HGBA1C 6.6 (A) 09/02/2017     Outpatient Medications Prior to Visit  Medication Sig Dispense Refill  . Ascorbic Acid (C-500/ROSE HIPS PO) Take 1 tablet by mouth daily.    Marland Kitchen atorvastatin (LIPITOR) 20 MG tablet TAKE 1 TABLET BY MOUTH EVERY DAY 90 tablet 3  . Coenzyme Q10 (HM COQ10) 100 MG capsule Take 100 mg by mouth daily.    . Garlic 9371 MG CAPS Take by mouth.    Marland Kitchen glucosamine-chondroitin 500-400 MG tablet Take 1 tablet by mouth 3 (three) times daily.    Marland Kitchen glucose blood test strip Use to check blood sugars once daily 100 each 12  . Lancets (ONETOUCH ULTRASOFT) lancets Use as instructed 100 each 12  . losartan (COZAAR) 50 MG tablet TAKE 1 TABLET BY MOUTH EVERY DAY 90 tablet 1  . metFORMIN (GLUCOPHAGE-XR) 500 MG 24 hr tablet TAKE 1 TABLET BY MOUTH EVERY DAY WITH BREAKFAST 90 tablet 1  . Multiple Vitamins-Minerals (MULTIVITAMIN WITH MINERALS) tablet Take 1 tablet by mouth daily.    . nizatidine (AXID) 300 MG capsule TAKE 1 CAPSULE BY MOUTH AT BEDTIME. 90 capsule 1  . Omega-3 Fatty Acids (FISH OIL) 1000 MG CAPS Take 1,000 mg by mouth daily.    Marland Kitchen  omeprazole (PRILOSEC) 20 MG capsule TAKE 1 CAPSULE TWICE A DAY 180 capsule 1  . chlorpheniramine-HYDROcodone (TUSSIONEX PENNKINETIC ER) 10-8 MG/5ML SUER Take 5 mLs by mouth at bedtime as needed for cough. (Patient not taking: Reported on 06/10/2017) 140 mL 0  . diazepam (VALIUM) 5 MG tablet Take 1 hour prior to procedure (Patient not taking: Reported on 09/02/2017) 1 tablet 0  . empagliflozin (JARDIANCE) 25 MG TABS tablet Take 25 mg by mouth daily. (Patient not taking: Reported on 09/02/2017) 90 tablet 1   No facility-administered medications prior to visit.     Review of Systems;  Patient denies headache, fevers, malaise, unintentional weight loss, skin rash, eye pain, sinus congestion and sinus pain, sore throat, dysphagia,  hemoptysis , cough, dyspnea, wheezing, chest pain, palpitations, orthopnea, edema, abdominal pain, nausea, melena, diarrhea, constipation, flank pain, dysuria, hematuria, urinary  Frequency, nocturia, numbness, tingling, seizures,  Focal weakness, Loss of consciousness,  Tremor, insomnia, depression, anxiety, and suicidal ideation.      Objective:  BP 138/76 (BP Location: Left Arm, Patient Position: Sitting, Cuff Size: Normal)   Pulse 83   Temp 98.5 F (36.9 C) (Oral)   Resp 14   Ht 5\' 9"  (1.753 m)   Wt 223 lb 3.2 oz (101.2 kg)   SpO2 93%  BMI 32.96 kg/m   BP Readings from Last 3 Encounters:  09/02/17 138/76  06/10/17 121/65  06/03/17 (!) 148/76    Wt Readings from Last 3 Encounters:  09/02/17 223 lb 3.2 oz (101.2 kg)  06/10/17 210 lb 8 oz (95.5 kg)  06/03/17 225 lb (102.1 kg)    General appearance: alert, cooperative and appears stated age Ears: normal TM's and external ear canals both ears Throat: lips, mucosa, and tongue normal; teeth and gums normal Neck: no adenopathy, no carotid bruit, supple, symmetrical, trachea midline and thyroid not enlarged, symmetric, no tenderness/mass/nodules Back: symmetric, no curvature. ROM normal. No CVA  tenderness. Lungs: clear to auscultation bilaterally Heart: regular rate and rhythm, S1, S2 normal, no murmur, click, rub or gallop Abdomen: soft, non-tender; bowel sounds normal; no masses,  no organomegaly Pulses: 2+ and symmetric Skin: Skin color, texture, turgor normal. No rashes or lesions Lymph nodes: Cervical, supraclavicular, and axillary nodes normal.  Lab Results  Component Value Date   HGBA1C 6.6 (A) 09/02/2017   HGBA1C 8.9 (H) 04/25/2017   HGBA1C 6.9 (H) 10/19/2016    Lab Results  Component Value Date   CREATININE 1.23 04/25/2017   CREATININE 1.17 10/19/2016   CREATININE 1.00 03/26/2016    Lab Results  Component Value Date   WBC 6.7 10/14/2015   HGB 15.0 10/14/2015   HCT 43.5 10/14/2015   PLT 183.0 10/14/2015   GLUCOSE 293 (H) 04/25/2017   CHOL 103 04/25/2017   TRIG 181.0 (H) 04/25/2017   HDL 41.80 04/25/2017   LDLDIRECT 59.0 03/26/2016   LDLCALC 25 04/25/2017   ALT 33 04/25/2017   AST 22 04/25/2017   NA 135 04/25/2017   K 5.2 (H) 04/25/2017   CL 99 04/25/2017   CREATININE 1.23 04/25/2017   BUN 14 04/25/2017   CO2 28 04/25/2017   PSA 1.10 10/19/2016   HGBA1C 6.6 (A) 09/02/2017   MICROALBUR <0.7 04/25/2017    Mr Abdomen W Wo Contrast  Result Date: 06/21/2017 CLINICAL DATA:  Elevated liver function tests. Abnormal liver on prior ultrasound. EXAM: MRI ABDOMEN WITHOUT AND WITH CONTRAST TECHNIQUE: Multiplanar multisequence MR imaging of the abdomen was performed both before and after the administration of intravenous contrast. CONTRAST:  11mL MULTIHANCE GADOBENATE DIMEGLUMINE 529 MG/ML IV SOLN COMPARISON:  Ultrasound on 11/08/2008 FINDINGS: Lower chest: No acute findings. Hepatobiliary: Mild diffuse hepatic steatosis. Multiple small cysts are seen throughout the left and right lobes, however no liver masses are identified. There are no definite gross morphologic changes of hepatic cirrhosis. Gallbladder is unremarkable. No evidence of biliary ductal dilatation.  Pancreas:  No mass or inflammatory changes. Spleen:  Within normal limits in size and appearance. Adrenals/Urinary Tract: No masses identified. A few tiny renal cysts are seen bilaterally, including a benign Bosniak category 2 cyst in the lower pole the left kidney. No evidence of hydronephrosis. Stomach/Bowel: Visualized portion unremarkable. Vascular/Lymphatic: No pathologically enlarged lymph nodes identified. No abdominal aortic aneurysm. Other:  None. Musculoskeletal:  No suspicious bone lesions identified. IMPRESSION: Mild hepatic steatosis and multiple small hepatic cysts. No evidence of hepatic neoplasm, biliary ductal dilatation, or other acute findings. Electronically Signed   By: Earle Gell M.D.   On: 06/21/2017 10:22    Assessment & Plan:   Problem List Items Addressed This Visit    Cirrhosis of liver without ascites ([place])    Repeat MRI abdomen negative for mass (Wohl et al) , lfts normal  Lab Results  Component Value Date   ALT 33 04/25/2017  AST 22 04/25/2017   ALKPHOS 56 04/25/2017   BILITOT 1.3 (H) 04/25/2017         Diabetes mellitus without complication ([place]) - Primary    Now under control without Jardiance,  Due to dietary changes .  Ncontinue metformin XR only  Lab Results  Component Value Date   HGBA1C 6.6 (A) 09/02/2017   Lab Results  Component Value Date   MICROALBUR <0.7 04/25/2017         Obesity (BMI 30-39.9)    I have congratulated him in his weight loss and encouraged  Continued weight loss with goal of 10% of body weigh over the next 6 months using a low glycemic index diet and regular exercise a minimum of 5 days per week.          A total of 25 minutes of face to face time was spent with patient more than half of which was spent in counselling about the above mentioned conditions  and coordination of care  I have discontinued Marvis E. Gibbins "Ron"'s empagliflozin, chlorpheniramine-HYDROcodone, and diazepam. I am also having him maintain his  Garlic, Coenzyme Q59, multivitamin with minerals, Ascorbic Acid (C-500/ROSE HIPS PO), Fish Oil, glucosamine-chondroitin, atorvastatin, metFORMIN, losartan, onetouch ultrasoft, glucose blood, nizatidine, and omeprazole.  No orders of the defined types were placed in this encounter.   Medications Discontinued During This Encounter  Medication Reason  . chlorpheniramine-HYDROcodone (TUSSIONEX PENNKINETIC ER) 10-8 MG/5ML SUER Completed Course  . diazepam (VALIUM) 5 MG tablet Completed Course  . empagliflozin (JARDIANCE) 25 MG TABS tablet Patient has not taken in last 30 days    Follow-up: Return in about 6 months (around 03/05/2018) for follow up diabetes.   Crecencio Mc, MD

## 2017-09-02 NOTE — Patient Instructions (Addendum)
Conratulations!!  Your a1c is now 6.6   YOu do not need to start the jardiance   I want you to check BS in the morning before eating once or twice per week  The other days check it 2 hrs after breaffast, or lunch or dinner    Work up to 30 minutes daily  On the [employer]

## 2017-09-04 NOTE — Assessment & Plan Note (Signed)
I have congratulated him in his weight loss and encouraged  Continued weight loss with goal of 10% of body weigh over the next 6 months using a low glycemic index diet and regular exercise a minimum of 5 days per week.

## 2017-09-04 NOTE — Assessment & Plan Note (Signed)
Well controlled for age on current regimen. Renal function stable, no changes today.  Lab Results  Component Value Date   CREATININE 1.23 04/25/2017   Lab Results  Component Value Date   NA 135 04/25/2017   K 5.2 (H) 04/25/2017   CL 99 04/25/2017   CO2 28 04/25/2017

## 2017-09-04 NOTE — Assessment & Plan Note (Signed)
Now under control without Jardiance,  Due to dietary changes .  Ncontinue metformin XR only  Lab Results  Component Value Date   HGBA1C 6.6 (A) 09/02/2017   Lab Results  Component Value Date   MICROALBUR <0.7 04/25/2017

## 2017-09-04 NOTE — Assessment & Plan Note (Signed)
Repeat MRI abdomen negative for mass (Wohl et al) , lfts normal  Lab Results  Component Value Date   ALT 33 04/25/2017   AST 22 04/25/2017   ALKPHOS 56 04/25/2017   BILITOT 1.3 (H) 04/25/2017

## 2017-09-30 ENCOUNTER — Other Ambulatory Visit: Payer: Self-pay | Admitting: Internal Medicine

## 2017-09-30 DIAGNOSIS — I10 Essential (primary) hypertension: Secondary | ICD-10-CM

## 2017-10-28 ENCOUNTER — Ambulatory Visit: Payer: No Typology Code available for payment source | Admitting: Internal Medicine

## 2017-12-24 ENCOUNTER — Other Ambulatory Visit: Payer: Self-pay | Admitting: Internal Medicine

## 2018-03-06 ENCOUNTER — Ambulatory Visit (INDEPENDENT_AMBULATORY_CARE_PROVIDER_SITE_OTHER): Payer: Medicare Other | Admitting: Internal Medicine

## 2018-03-06 ENCOUNTER — Encounter: Payer: Self-pay | Admitting: Internal Medicine

## 2018-03-06 VITALS — BP 144/78 | HR 76 | Temp 98.5°F | Resp 16 | Ht 69.0 in | Wt 227.4 lb

## 2018-03-06 DIAGNOSIS — E785 Hyperlipidemia, unspecified: Secondary | ICD-10-CM

## 2018-03-06 DIAGNOSIS — K760 Fatty (change of) liver, not elsewhere classified: Secondary | ICD-10-CM

## 2018-03-06 DIAGNOSIS — E119 Type 2 diabetes mellitus without complications: Secondary | ICD-10-CM

## 2018-03-06 DIAGNOSIS — E669 Obesity, unspecified: Secondary | ICD-10-CM | POA: Diagnosis not present

## 2018-03-06 DIAGNOSIS — E1169 Type 2 diabetes mellitus with other specified complication: Secondary | ICD-10-CM | POA: Diagnosis not present

## 2018-03-06 DIAGNOSIS — K7460 Unspecified cirrhosis of liver: Secondary | ICD-10-CM

## 2018-03-06 DIAGNOSIS — Z125 Encounter for screening for malignant neoplasm of prostate: Secondary | ICD-10-CM

## 2018-03-06 DIAGNOSIS — I10 Essential (primary) hypertension: Secondary | ICD-10-CM

## 2018-03-06 LAB — LIPID PANEL
CHOLESTEROL: 99 mg/dL (ref 0–200)
HDL: 36.2 mg/dL — ABNORMAL LOW (ref >39.00)
LDL Cholesterol: 32 mg/dL (ref 0–99)
NonHDL: 62.6
Total CHOL/HDL Ratio: 3
Triglycerides: 154 mg/dL — ABNORMAL HIGH (ref 0.0–149.0)
VLDL: 30.8 mg/dL (ref 0.0–40.0)

## 2018-03-06 LAB — COMPREHENSIVE METABOLIC PANEL
ALBUMIN: 4.2 g/dL (ref 3.5–5.2)
ALK PHOS: 63 U/L (ref 39–117)
ALT: 20 U/L (ref 0–53)
AST: 18 U/L (ref 0–37)
BUN: 13 mg/dL (ref 6–23)
CO2: 24 mEq/L (ref 19–32)
Calcium: 9.3 mg/dL (ref 8.4–10.5)
Chloride: 99 mEq/L (ref 96–112)
Creatinine, Ser: 1.15 mg/dL (ref 0.40–1.50)
GFR: 62.31 mL/min (ref >60.00)
Glucose, Bld: 214 mg/dL — ABNORMAL HIGH (ref 70–99)
Potassium: 4.3 mEq/L (ref 3.5–5.1)
SODIUM: 134 meq/L — AB (ref 135–145)
TOTAL PROTEIN: 6.6 g/dL (ref 6.0–8.3)
Total Bilirubin: 1.1 mg/dL (ref 0.2–1.2)

## 2018-03-06 LAB — MICROALBUMIN / CREATININE URINE RATIO
CREATININE, U: 100.8 mg/dL
MICROALB/CREAT RATIO: 0.7 mg/g (ref 0.0–30.0)

## 2018-03-06 LAB — HEMOGLOBIN A1C: Hgb A1c MFr Bld: 7.7 % — ABNORMAL HIGH (ref 4.6–6.5)

## 2018-03-06 LAB — PSA, MEDICARE: PSA: 1.47 ng/mL (ref 0.10–4.00)

## 2018-03-06 NOTE — Patient Instructions (Addendum)
I want you to lose 17 lbs by your next visit in 6 months .  Get your annual eye exam    Cutting out sugar for breakfast is recommended:   1) Try one Low Carb high Protein premixed Shakes: all are under 3 g sugar   Premier Protein  (the best tasting )  Atkins Advantage Muscle Milk EAS AdvantEdge   All of these are available at [employer], [place]  And taste good    OR TRY Jimmy [employer]'s  frittata or  "eggwhich"  .   Both  can be microwaved in 2 minutes and are very low carb. Frittats are similar to quiches without the crust    2)  The new goals for optimal blood pressure management are 130/80.  Marland Kitchen  Please check your blood pressure a few times at home and send me the readings so I can determine if you need a change in medication

## 2018-03-06 NOTE — Progress Notes (Signed)
Subjective:  Patient ID: Chad Gill, male    DOB: [birth year]  Age: 73 y.o. MRN: [id number]  CC: The primary encounter diagnosis was Prostate cancer screening. Diagnoses of Fatty liver disease, nonalcoholic, Diabetes mellitus without complication ([place]), Cirrhosis of liver without ascites, unspecified hepatic cirrhosis type ([place]), Hyperlipidemia associated with type 2 diabetes mellitus ([place]), Obesity (BMI 30-39.9), and Essential hypertension were also pertinent to this visit.  HPI Chad Gill presents for follow up on type 2 DM ,  Cirrhosis secondary to NASH, and hypertension.  Patient has no complaints today.  Patient is following a low glycemic index diet and taking all prescribed medications regularly without side effects.  Fasting sugars have been under less than 140 most of the time and post prandials have been under 180 except on rare occasions. Patient is not exercising about 3 times per week and intentionally trying to lose weight .  Patient has had an eye exam in the last 12 months and checks feet regularly for signs of infection.  Patient does not walk barefoot outside,  And denies any numbness tingling or burning in feet. Patient is up to date on all recommended vaccinations  Lab Results  Component Value Date   HGBA1C 7.7 (H) 03/06/2018   Hypertension: patient has not checked  blood pressure in several months.  Readings had been  < 132/84 at rest at last check.  Patient is following a reduced salt diet most days and is taking medications as prescribed  Outpatient Medications Prior to Visit  Medication Sig Dispense Refill  . Ascorbic Acid (C-500/ROSE HIPS PO) Take 1 tablet by mouth daily.    Marland Kitchen atorvastatin (LIPITOR) 20 MG tablet TAKE 1 TABLET BY MOUTH EVERY DAY 90 tablet 3  . Coenzyme Q10 (HM COQ10) 100 MG capsule Take 100 mg by mouth daily.    . Garlic 7829 MG CAPS Take by mouth.    Marland Kitchen glucosamine-chondroitin 500-400 MG tablet Take 1 tablet by mouth 3 (three) times daily.      Marland Kitchen glucose blood test strip Use to check blood sugars once daily 100 each 12  . Lancets (ONETOUCH ULTRASOFT) lancets Use as instructed 100 each 12  . losartan (COZAAR) 50 MG tablet TAKE 1 TABLET BY MOUTH EVERY DAY 90 tablet 1  . metFORMIN (GLUCOPHAGE-XR) 500 MG 24 hr tablet TAKE 1 TABLET BY MOUTH EVERY DAY WITH BREAKFAST 90 tablet 1  . Multiple Vitamins-Minerals (MULTIVITAMIN WITH MINERALS) tablet Take 1 tablet by mouth daily.    . nizatidine (AXID) 300 MG capsule TAKE 1 CAPSULE BY MOUTH AT BEDTIME. 90 capsule 1  . Omega-3 Fatty Acids (FISH OIL) 1000 MG CAPS Take 1,000 mg by mouth daily.    Marland Kitchen omeprazole (PRILOSEC) 20 MG capsule TAKE 1 CAPSULE BY MOUTH TWICE A DAY 180 capsule 1   No facility-administered medications prior to visit.     Review of Systems;  Patient denies headache, fevers, malaise, unintentional weight loss, skin rash, eye pain, sinus congestion and sinus pain, sore throat, dysphagia,  hemoptysis , cough, dyspnea, wheezing, chest pain, palpitations, orthopnea, edema, abdominal pain, nausea, melena, diarrhea, constipation, flank pain, dysuria, hematuria, urinary  Frequency, nocturia, numbness, tingling, seizures,  Focal weakness, Loss of consciousness,  Tremor, insomnia, depression, anxiety, and suicidal ideation.      Objective:  BP (!) 144/78 (BP Location: Left Arm, Patient Position: Sitting, Cuff Size: Large)   Pulse 76   Temp 98.5 F (36.9 C) (Oral)   Resp 16   Ht 5\' 9"  (  1.753 m)   Wt 227 lb 6.4 oz (103.1 kg)   SpO2 96%   BMI 33.58 kg/m   BP Readings from Last 3 Encounters:  03/06/18 (!) 144/78  09/02/17 138/76  06/10/17 121/65    Wt Readings from Last 3 Encounters:  03/06/18 227 lb 6.4 oz (103.1 kg)  09/02/17 223 lb 3.2 oz (101.2 kg)  06/10/17 210 lb 8 oz (95.5 kg)    General appearance: alert, cooperative and appears stated age Ears: normal TM's and external ear canals both ears Throat: lips, mucosa, and tongue normal; teeth and gums normal Neck: no  adenopathy, no carotid bruit, supple, symmetrical, trachea midline and thyroid not enlarged, symmetric, no tenderness/mass/nodules Back: symmetric, no curvature. ROM normal. No CVA tenderness. Lungs: clear to auscultation bilaterally Heart: regular rate and rhythm, S1, S2 normal, no murmur, click, rub or gallop Abdomen: soft, non-tender; bowel sounds normal; no masses,  no organomegaly Pulses: 2+ and symmetric Skin: Skin color, texture, turgor normal. No rashes or lesions Lymph nodes: Cervical, supraclavicular, and axillary nodes normal.  Lab Results  Component Value Date   HGBA1C 7.7 (H) 03/06/2018   HGBA1C 6.6 (A) 09/02/2017   HGBA1C 8.9 (H) 04/25/2017    Lab Results  Component Value Date   CREATININE 1.15 03/06/2018   CREATININE 1.23 04/25/2017   CREATININE 1.17 10/19/2016    Lab Results  Component Value Date   WBC 6.7 10/14/2015   HGB 15.0 10/14/2015   HCT 43.5 10/14/2015   PLT 183.0 10/14/2015   GLUCOSE 214 (H) 03/06/2018   CHOL 99 03/06/2018   TRIG 154.0 (H) 03/06/2018   HDL 36.20 (L) 03/06/2018   LDLDIRECT 59.0 03/26/2016   LDLCALC 32 03/06/2018   ALT 20 03/06/2018   AST 18 03/06/2018   NA 134 (L) 03/06/2018   K 4.3 03/06/2018   CL 99 03/06/2018   CREATININE 1.15 03/06/2018   BUN 13 03/06/2018   CO2 24 03/06/2018   PSA 1.47 03/06/2018   HGBA1C 7.7 (H) 03/06/2018   MICROALBUR <0.7 03/06/2018    Mr Abdomen W Wo Contrast  Result Date: 06/21/2017 CLINICAL DATA:  Elevated liver function tests. Abnormal liver on prior ultrasound. EXAM: MRI ABDOMEN WITHOUT AND WITH CONTRAST TECHNIQUE: Multiplanar multisequence MR imaging of the abdomen was performed both before and after the administration of intravenous contrast. CONTRAST:  41mL MULTIHANCE GADOBENATE DIMEGLUMINE 529 MG/ML IV SOLN COMPARISON:  Ultrasound on 11/08/2008 FINDINGS: Lower chest: No acute findings. Hepatobiliary: Mild diffuse hepatic steatosis. Multiple small cysts are seen throughout the left and right  lobes, however no liver masses are identified. There are no definite gross morphologic changes of hepatic cirrhosis. Gallbladder is unremarkable. No evidence of biliary ductal dilatation. Pancreas:  No mass or inflammatory changes. Spleen:  Within normal limits in size and appearance. Adrenals/Urinary Tract: No masses identified. A few tiny renal cysts are seen bilaterally, including a benign Bosniak category 2 cyst in the lower pole the left kidney. No evidence of hydronephrosis. Stomach/Bowel: Visualized portion unremarkable. Vascular/Lymphatic: No pathologically enlarged lymph nodes identified. No abdominal aortic aneurysm. Other:  None. Musculoskeletal:  No suspicious bone lesions identified. IMPRESSION: Mild hepatic steatosis and multiple small hepatic cysts. No evidence of hepatic neoplasm, biliary ductal dilatation, or other acute findings. Electronically Signed   By: Earle Gell M.D.   On: 06/21/2017 10:22    Assessment & Plan:   Problem List Items Addressed This Visit    Cirrhosis of liver without ascites ([place])    Repeat MRI abdomen negative for mass (  Wohl et al) , lfts normal.  alpha fetoprotein added  Lab Results  Component Value Date   ALT 20 03/06/2018   AST 18 03/06/2018   ALKPHOS 63 03/06/2018   BILITOT 1.1 03/06/2018         Relevant Orders   AFP tumor marker (Completed)   Diabetes mellitus without complication (HCC)    Loss of control despite adding Jardiance,  Due to dietary nonadherence .  Education given..  Exercise and weight loss recommended.  Continue metformin XR  And Jardiance   Lab Results  Component Value Date   HGBA1C 7.7 (H) 03/06/2018   Lab Results  Component Value Date   MICROALBUR <0.7 03/06/2018         Relevant Orders   Hemoglobin A1c (Completed)   Lipid panel (Completed)   Microalbumin / creatinine urine ratio (Completed)   RESOLVED: Fatty liver disease, nonalcoholic   Relevant Orders   Comprehensive metabolic panel (Completed)    Hyperlipidemia associated with type 2 diabetes mellitus (HCC)    LDL and triglycerides are at goal on current medications. He has no side effects and liver enzymes are normal. No changes today   Lab Results  Component Value Date   CHOL 99 03/06/2018   HDL 36.20 (L) 03/06/2018   LDLCALC 32 03/06/2018   LDLDIRECT 59.0 03/26/2016   TRIG 154.0 (H) 03/06/2018   CHOLHDL 3 03/06/2018   Lab Results  Component Value Date   ALT 20 03/06/2018   AST 18 03/06/2018   ALKPHOS 63 03/06/2018   BILITOT 1.1 03/06/2018         Hypertension    Well controlled on current regimen. Renal function stable, no changes today.      Obesity (BMI 30-39.9)    I have  encouraged  Continued weight loss with goal of 10% of body weight over the next 6 months using a low glycemic index diet and regular exercise a minimum of 5 days per week.         Other Visit Diagnoses    Prostate cancer screening    -  Primary   Relevant Orders   PSA, Medicare (Completed)    A total of 25 minutes of face to face time was spent with patient more than half of which was spent in counselling about the above mentioned conditions  and coordination of care   I am having Chad Moll E. Loura Back "Ron" maintain his Garlic, Coenzyme V49, multivitamin with minerals, Ascorbic Acid (C-500/ROSE HIPS PO), Fish Oil, glucosamine-chondroitin, atorvastatin, onetouch ultrasoft, glucose blood, losartan, metFORMIN, nizatidine, and omeprazole.  No orders of the defined types were placed in this encounter.   There are no discontinued medications.  Follow-up: Return in about 6 months (around 09/04/2018) for follow up diabetes.   Crecencio Mc, MD

## 2018-03-07 LAB — AFP TUMOR MARKER: AFP TUMOR MARKER: 2.6 ng/mL (ref <6.1)

## 2018-03-09 NOTE — Assessment & Plan Note (Signed)
I have  encouraged  Continued weight loss with goal of 10% of body weight over the next 6 months using a low glycemic index diet and regular exercise a minimum of 5 days per week.

## 2018-03-09 NOTE — Assessment & Plan Note (Signed)
LDL and triglycerides are at goal on current medications. He has no side effects and liver enzymes are normal. No changes today   Lab Results  Component Value Date   CHOL 99 03/06/2018   HDL 36.20 (L) 03/06/2018   LDLCALC 32 03/06/2018   LDLDIRECT 59.0 03/26/2016   TRIG 154.0 (H) 03/06/2018   CHOLHDL 3 03/06/2018   Lab Results  Component Value Date   ALT 20 03/06/2018   AST 18 03/06/2018   ALKPHOS 63 03/06/2018   BILITOT 1.1 03/06/2018

## 2018-03-09 NOTE — Assessment & Plan Note (Signed)
Repeat MRI abdomen negative for mass (Wohl et al) , lfts normal.  alpha fetoprotein added  Lab Results  Component Value Date   ALT 20 03/06/2018   AST 18 03/06/2018   ALKPHOS 63 03/06/2018   BILITOT 1.1 03/06/2018

## 2018-03-09 NOTE — Assessment & Plan Note (Signed)
Loss of control despite adding Jardiance,  Due to dietary nonadherence .  Education given..  Exercise and weight loss recommended.  Continue metformin XR  And Jardiance   Lab Results  Component Value Date   HGBA1C 7.7 (H) 03/06/2018   Lab Results  Component Value Date   MICROALBUR <0.7 03/06/2018

## 2018-03-09 NOTE — Assessment & Plan Note (Signed)
Well controlled on current regimen. Renal function stable, no changes today. 

## 2018-03-10 ENCOUNTER — Telehealth: Payer: Self-pay | Admitting: Internal Medicine

## 2018-03-10 MED ORDER — METFORMIN HCL ER 500 MG PO TB24: ORAL_TABLET | ORAL | 1 refills | Status: DC

## 2018-03-10 NOTE — Telephone Encounter (Signed)
Copied from [place]. Topic: Quick Communication - See Telephone Encounter >> Mar 10, 2018  9:11 AM Rutherford Nail, NT wrote: CRM for notification. See Telephone encounter for: 03/10/18. Patient's wife calling and states that the patient heard back from Dr Derrel Nip regarding his results, but was told that he needs to up his dose of metformin. Patient's wife states that he has not taken that medication in a while and needs a new prescription sent to the pharmacy. [id number] - [place], [place] - [address]

## 2018-03-10 NOTE — Telephone Encounter (Signed)
Start back on original dose,  rx sent to [facility] in [place]

## 2018-03-10 NOTE — Telephone Encounter (Signed)
Pt's wife called and stated that the pt has not taken his Metformin in awhile and would need a refill called in. Does the pt need to still increase the dose or start back on the original dose since he was not bee taking it.

## 2018-03-10 NOTE — Telephone Encounter (Signed)
Spoke with pt to let him know that he will need to start back on his original dose of metformin and that the [facility] has [facility] [place]. Pt gave a verbal understanding.

## 2018-03-23 ENCOUNTER — Other Ambulatory Visit: Payer: Self-pay | Admitting: Internal Medicine

## 2018-03-23 DIAGNOSIS — I10 Essential (primary) hypertension: Secondary | ICD-10-CM

## 2018-04-17 ENCOUNTER — Other Ambulatory Visit: Payer: Self-pay | Admitting: Internal Medicine

## 2018-06-16 ENCOUNTER — Other Ambulatory Visit: Payer: Self-pay | Admitting: Internal Medicine

## 2018-08-22 ENCOUNTER — Other Ambulatory Visit: Payer: Self-pay | Admitting: Internal Medicine

## 2018-08-25 ENCOUNTER — Other Ambulatory Visit: Payer: Self-pay | Admitting: Internal Medicine

## 2018-08-25 MED ORDER — METFORMIN HCL 500 MG PO TABS: 500.0000 mg | ORAL_TABLET | Freq: Two times a day (BID) | ORAL | 3 refills | Status: DC

## 2018-08-25 NOTE — Progress Notes (Signed)
MyChart message sent re metformin recall

## 2018-08-25 NOTE — Telephone Encounter (Signed)
Refill request for metformin ER.

## 2018-09-05 ENCOUNTER — Encounter: Payer: Self-pay | Admitting: Internal Medicine

## 2018-09-05 ENCOUNTER — Ambulatory Visit (INDEPENDENT_AMBULATORY_CARE_PROVIDER_SITE_OTHER): Payer: Medicare Other | Admitting: Internal Medicine

## 2018-09-05 ENCOUNTER — Other Ambulatory Visit: Payer: Self-pay

## 2018-09-05 ENCOUNTER — Encounter: Payer: PRIVATE HEALTH INSURANCE | Admitting: Internal Medicine

## 2018-09-05 VITALS — BP 150/82 | HR 97 | Temp 98.0°F | Resp 16 | Ht 69.0 in | Wt 236.4 lb

## 2018-09-05 DIAGNOSIS — I10 Essential (primary) hypertension: Secondary | ICD-10-CM | POA: Diagnosis not present

## 2018-09-05 DIAGNOSIS — E1169 Type 2 diabetes mellitus with other specified complication: Secondary | ICD-10-CM

## 2018-09-05 DIAGNOSIS — K635 Polyp of colon: Secondary | ICD-10-CM

## 2018-09-05 DIAGNOSIS — E119 Type 2 diabetes mellitus without complications: Secondary | ICD-10-CM

## 2018-09-05 DIAGNOSIS — E785 Hyperlipidemia, unspecified: Secondary | ICD-10-CM

## 2018-09-05 DIAGNOSIS — K760 Fatty (change of) liver, not elsewhere classified: Secondary | ICD-10-CM

## 2018-09-05 NOTE — Progress Notes (Signed)
Subjective:  Patient ID: Chad Gill, male    DOB: [birth year]  Age: 73 y.o. MRN: [id number]  CC: The primary encounter diagnosis was Diabetes mellitus without complication ([place]). Diagnoses of Essential hypertension, Hyperlipidemia associated with type 2 diabetes mellitus ([place]), NAFLD (nonalcoholic fatty liver disease), and Hyperplastic colonic polyp, unspecified part of colon were also pertinent to this visit.  HPI Chad Gill presents for follow up on diabetes and hypertension   Cc:  He has had a recurrence of back pain  after carrying heavy groceries a few days ago  In the L4 disk area . The pain does not radiate and is  improving without medication .  He has a remote history of low back pain with sciatica  in 2009 that was treated with ESI x 3 .  There is no MRI on file   T2FM:  he feels generally well, is walking daily for exercise and checking blood sugars once daily at variable times.  BS have been under 140  fasting and < 160 post prandially.  Denies any recent hypoglyemic events.  Taking his medications as directed. Following a carbohydrate modified diet 6 days per week. Denies numbness, burning and tingling of extremities. Appetite is good.      Overdue for eye exam   Hypertension: patient checks blood pressure twice weekly at home.  Readings have been for the most part < 140/80 at rest . Patient is following a reduced salt diet most days and is taking medications as prescribed.  He denies chest pain, orthopnea, LE edema and headache.   Outpatient Medications Prior to Visit  Medication Sig Dispense Refill   Ascorbic Acid (C-500/ROSE HIPS PO) Take 1 tablet by mouth daily.     atorvastatin (LIPITOR) 20 MG tablet TAKE 1 TABLET BY MOUTH EVERY DAY 90 tablet 1   Garlic 4098 MG CAPS Take by mouth.     glucose blood test strip Use to check blood sugars once daily 100 each 12   Lancets (ONETOUCH ULTRASOFT) lancets Use as instructed 100 each 12   losartan (COZAAR) 50 MG  tablet TAKE 1 TABLET BY MOUTH EVERY DAY 90 tablet 1   metFORMIN (GLUCOPHAGE) 500 MG tablet Take 1 tablet (500 mg total) by mouth 2 (two) times daily with a meal. 180 tablet 3   Multiple Vitamins-Minerals (MULTIVITAMIN WITH MINERALS) tablet Take 1 tablet by mouth daily.     Omega-3 Fatty Acids (FISH OIL) 1000 MG CAPS Take 1,000 mg by mouth daily.     omeprazole (PRILOSEC) 20 MG capsule TAKE 1 CAPSULE BY MOUTH TWICE A DAY 180 capsule 1   nizatidine (AXID) 300 MG capsule TAKE 1 CAPSULE BY MOUTH AT BEDTIME. (Patient not taking: Reported on 09/05/2018) 90 capsule 1   Coenzyme Q10 (HM COQ10) 100 MG capsule Take 100 mg by mouth daily.     glucosamine-chondroitin 500-400 MG tablet Take 1 tablet by mouth 3 (three) times daily.     No facility-administered medications prior to visit.     Review of Systems;  Patient denies headache, fevers, malaise, unintentional weight loss, skin rash, eye pain, sinus congestion and sinus pain, sore throat, dysphagia,  hemoptysis , cough, dyspnea, wheezing, chest pain, palpitations, orthopnea, edema, abdominal pain, nausea, melena, diarrhea, constipation, flank pain, dysuria, hematuria, urinary  Frequency, nocturia, numbness, tingling, seizures,  Focal weakness, Loss of consciousness,  Tremor, insomnia, depression, anxiety, and suicidal ideation.      Objective:  BP (!) 150/82 (BP Location: Left Arm, Patient Position: Sitting,  Cuff Size: Large)    Pulse 97    Temp 98 F (36.7 C) (Oral)    Resp 16    Ht 5\' 9"  (1.753 m)    Wt 236 lb 6.4 oz (107.2 kg)    SpO2 97%    BMI 34.91 kg/m   BP Readings from Last 3 Encounters:  09/05/18 (!) 150/82  03/06/18 (!) 144/78  09/02/17 138/76    Wt Readings from Last 3 Encounters:  09/05/18 236 lb 6.4 oz (107.2 kg)  03/06/18 227 lb 6.4 oz (103.1 kg)  09/02/17 223 lb 3.2 oz (101.2 kg)    General appearance: alert, cooperative and appears stated age Ears: normal TM's and external ear canals both ears Throat: lips, mucosa,  and tongue normal; teeth and gums normal Neck: no adenopathy, no carotid bruit, supple, symmetrical, trachea midline and thyroid not enlarged, symmetric, no tenderness/mass/nodules Back: symmetric, no curvature. ROM normal. No CVA tenderness. Lungs: clear to auscultation bilaterally Heart: regular rate and rhythm, S1, S2 normal, no murmur, click, rub or gallop Abdomen: soft, non-tender; bowel sounds normal; no masses,  no organomegaly Pulses: 2+ and symmetric Skin: Skin color, texture, turgor normal. No rashes or lesions Lymph nodes: Cervical, supraclavicular, and axillary nodes normal.  Lab Results  Component Value Date   HGBA1C 7.5 (H) 09/05/2018   HGBA1C 7.7 (H) 03/06/2018   HGBA1C 6.6 (A) 09/02/2017    Lab Results  Component Value Date   CREATININE 1.11 09/05/2018   CREATININE 1.15 03/06/2018   CREATININE 1.23 04/25/2017    Lab Results  Component Value Date   WBC 6.7 10/14/2015   HGB 15.0 10/14/2015   HCT 43.5 10/14/2015   PLT 183.0 10/14/2015   GLUCOSE 282 (H) 09/05/2018   CHOL 99 03/06/2018   TRIG 154.0 (H) 03/06/2018   HDL 36.20 (L) 03/06/2018   LDLDIRECT 59.0 03/26/2016   LDLCALC 32 03/06/2018   ALT 27 09/05/2018   AST 20 09/05/2018   NA 133 (L) 09/05/2018   K 4.5 09/05/2018   CL 98 09/05/2018   CREATININE 1.11 09/05/2018   BUN 12 09/05/2018   CO2 26 09/05/2018   PSA 1.47 03/06/2018   HGBA1C 7.5 (H) 09/05/2018   MICROALBUR <0.7 03/06/2018    Mr Abdomen W Wo Contrast  Result Date: 06/21/2017 CLINICAL DATA:  Elevated liver function tests. Abnormal liver on prior ultrasound. EXAM: MRI ABDOMEN WITHOUT AND WITH CONTRAST TECHNIQUE: Multiplanar multisequence MR imaging of the abdomen was performed both before and after the administration of intravenous contrast. CONTRAST:  13mL MULTIHANCE GADOBENATE DIMEGLUMINE 529 MG/ML IV SOLN COMPARISON:  Ultrasound on 11/08/2008 FINDINGS: Lower chest: No acute findings. Hepatobiliary: Mild diffuse hepatic steatosis. Multiple  small cysts are seen throughout the left and right lobes, however no liver masses are identified. There are no definite gross morphologic changes of hepatic cirrhosis. Gallbladder is unremarkable. No evidence of biliary ductal dilatation. Pancreas:  No mass or inflammatory changes. Spleen:  Within normal limits in size and appearance. Adrenals/Urinary Tract: No masses identified. A few tiny renal cysts are seen bilaterally, including a benign Bosniak category 2 cyst in the lower pole the left kidney. No evidence of hydronephrosis. Stomach/Bowel: Visualized portion unremarkable. Vascular/Lymphatic: No pathologically enlarged lymph nodes identified. No abdominal aortic aneurysm. Other:  None. Musculoskeletal:  No suspicious bone lesions identified. IMPRESSION: Mild hepatic steatosis and multiple small hepatic cysts. No evidence of hepatic neoplasm, biliary ductal dilatation, or other acute findings. Electronically Signed   By: Earle Gell M.D.   On: 06/21/2017 10:22  Assessment & Plan:   Problem List Items Addressed This Visit      Unprioritized   NAFLD (nonalcoholic fatty liver disease)    Repeat MRI abdomen negative for mass (Wohl et al) , lfts normal.  alpha fetoprotein added  Lab Results  Component Value Date   ALT 27 09/05/2018   AST 20 09/05/2018   ALKPHOS 63 03/06/2018   BILITOT 0.8 09/05/2018         Hypertension    Elevated in office but hoe readings indicate that he is well controlled on losartan 50 mg daily.  Renal function stable, no changes today.  Lab Results  Component Value Date   CREATININE 1.11 09/05/2018   BUN 12 09/05/2018   NA 133 (L) 09/05/2018   K 4.5 09/05/2018   CL 98 09/05/2018   CO2 26 09/05/2018   Lab Results  Component Value Date   MICROALBUR <0.7 03/06/2018         Hyperplastic colonic polyp   Hyperlipidemia associated with type 2 diabetes mellitus (HCC)    LDL and triglycerides are at goal on atorvastatin 20 mg.  He has cirrhosis secondary to  NASH, by MRI abdomen done in 2019,   But   liver enzymes have normalized . No changes today   Lab Results  Component Value Date   CHOL 99 03/06/2018   HDL 36.20 (L) 03/06/2018   LDLCALC 32 03/06/2018   LDLDIRECT 59.0 03/26/2016   TRIG 154.0 (H) 03/06/2018   CHOLHDL 3 03/06/2018   Lab Results  Component Value Date   ALT 27 09/05/2018   AST 20 09/05/2018   ALKPHOS 63 03/06/2018   BILITOT 0.8 09/05/2018         Relevant Medications   sitaGLIPtin (JANUVIA) 50 MG tablet   Diabetes mellitus without complication ([place]) - Primary    Improving control on current regimen of metformin ,  but not at goal of A1c , 7.0 and fasting sugars are elevated.  Will add Januvia   Lab Results  Component Value Date   HGBA1C 7.5 (H) 09/05/2018         Relevant Medications   sitaGLIPtin (JANUVIA) 50 MG tablet   Other Relevant Orders   Comprehensive metabolic panel (Completed)   Hemoglobin A1c (Completed)      I have discontinued Jori Moll E. Steffensmeier "Ron"'s Coenzyme Q10 and glucosamine-chondroitin. I am also having him start on sitaGLIPtin. Additionally, I am having him maintain his Garlic, multivitamin with minerals, Ascorbic Acid (C-500/ROSE HIPS PO), Fish Oil, onetouch ultrasoft, glucose blood, losartan, atorvastatin, nizatidine, omeprazole, and metFORMIN.  Meds ordered this encounter  Medications   sitaGLIPtin (JANUVIA) 50 MG tablet    Sig: Take 1 tablet (50 mg total) by mouth daily.    Dispense:  90 tablet    Refill:  0    Medications Discontinued During This Encounter  Medication Reason   Coenzyme Q10 (HM COQ10) 100 MG capsule Error   glucosamine-chondroitin 500-400 MG tablet Error    Follow-up: No follow-ups on file.   Crecencio Mc, MD

## 2018-09-05 NOTE — Patient Instructions (Addendum)
[facility] in [place] has great prices on corrective lenses . They do not do eye exams though   Go get your diabetic  Eye exam! It's very important to do this annually  Our goal a1c is 7.0,  If we are not at goal we will try increasing  the metformin dose

## 2018-09-06 LAB — HEMOGLOBIN A1C
Hgb A1c MFr Bld: 7.5 % of total Hgb — ABNORMAL HIGH (ref <5.7)
Mean Plasma Glucose: 169 (calc)
eAG (mmol/L): 9.3 (calc)

## 2018-09-06 LAB — COMPREHENSIVE METABOLIC PANEL
AG Ratio: 1.8 (calc) (ref 1.0–2.5)
ALT: 27 U/L (ref 9–46)
AST: 20 U/L (ref 10–35)
Albumin: 4.2 g/dL (ref 3.6–5.1)
Alkaline phosphatase (APISO): 47 U/L (ref 35–144)
BUN: 12 mg/dL (ref 7–25)
CO2: 26 mmol/L (ref 20–32)
Calcium: 9.6 mg/dL (ref 8.6–10.3)
Chloride: 98 mmol/L (ref 98–110)
Creat: 1.11 mg/dL (ref 0.70–1.18)
Globulin: 2.4 g/dL (calc) (ref 1.9–3.7)
Glucose, Bld: 282 mg/dL — ABNORMAL HIGH (ref 65–99)
Potassium: 4.5 mmol/L (ref 3.5–5.3)
Sodium: 133 mmol/L — ABNORMAL LOW (ref 135–146)
Total Bilirubin: 0.8 mg/dL (ref 0.2–1.2)
Total Protein: 6.6 g/dL (ref 6.1–8.1)

## 2018-09-07 ENCOUNTER — Other Ambulatory Visit: Payer: Self-pay | Admitting: Internal Medicine

## 2018-09-07 DIAGNOSIS — K635 Polyp of colon: Secondary | ICD-10-CM

## 2018-09-07 MED ORDER — SITAGLIPTIN PHOSPHATE 50 MG PO TABS: 50.0000 mg | ORAL_TABLET | Freq: Every day | ORAL | 0 refills | Status: DC

## 2018-09-07 NOTE — Assessment & Plan Note (Signed)
Repeat MRI abdomen negative for mass (Wohl et al) , lfts normal.  alpha fetoprotein added  Lab Results  Component Value Date   ALT 27 09/05/2018   AST 20 09/05/2018   ALKPHOS 63 03/06/2018   BILITOT 0.8 09/05/2018

## 2018-09-07 NOTE — Assessment & Plan Note (Signed)
Elevated in office but hoe readings indicate that he is well controlled on losartan 50 mg daily.  Renal function stable, no changes today.  Lab Results  Component Value Date   CREATININE 1.11 09/05/2018   BUN 12 09/05/2018   NA 133 (L) 09/05/2018   K 4.5 09/05/2018   CL 98 09/05/2018   CO2 26 09/05/2018   Lab Results  Component Value Date   MICROALBUR <0.7 03/06/2018

## 2018-09-07 NOTE — Assessment & Plan Note (Signed)
Improving control on current regimen of metformin ,  but not at goal of A1c , 7.0 and fasting sugars are elevated.  Will add Januvia   Lab Results  Component Value Date   HGBA1C 7.5 (H) 09/05/2018

## 2018-09-07 NOTE — Assessment & Plan Note (Addendum)
LDL and triglycerides are at goal on atorvastatin 20 mg.  He has cirrhosis secondary to NASH, by MRI abdomen done in 2019,   But   liver enzymes have normalized . No changes today   Lab Results  Component Value Date   CHOL 99 03/06/2018   HDL 36.20 (L) 03/06/2018   LDLCALC 32 03/06/2018   LDLDIRECT 59.0 03/26/2016   TRIG 154.0 (H) 03/06/2018   CHOLHDL 3 03/06/2018   Lab Results  Component Value Date   ALT 27 09/05/2018   AST 20 09/05/2018   ALKPHOS 63 03/06/2018   BILITOT 0.8 09/05/2018

## 2018-09-11 ENCOUNTER — Other Ambulatory Visit: Payer: Self-pay | Admitting: Internal Medicine

## 2018-09-11 DIAGNOSIS — I10 Essential (primary) hypertension: Secondary | ICD-10-CM

## 2018-10-14 ENCOUNTER — Other Ambulatory Visit: Payer: Self-pay | Admitting: Internal Medicine

## 2018-11-30 ENCOUNTER — Other Ambulatory Visit: Payer: Self-pay | Admitting: Internal Medicine

## 2018-12-05 ENCOUNTER — Other Ambulatory Visit: Payer: Self-pay | Admitting: Internal Medicine

## 2018-12-05 DIAGNOSIS — E119 Type 2 diabetes mellitus without complications: Secondary | ICD-10-CM | POA: Diagnosis not present

## 2018-12-05 LAB — HM DIABETES EYE EXAM

## 2018-12-08 MED ORDER — METFORMIN HCL 500 MG PO TABS: 500.0000 mg | ORAL_TABLET | Freq: Two times a day (BID) | ORAL | 3 refills | Status: DC

## 2019-01-07 ENCOUNTER — Encounter: Payer: Self-pay | Admitting: Internal Medicine

## 2019-03-08 ENCOUNTER — Other Ambulatory Visit: Payer: Self-pay | Admitting: Internal Medicine

## 2019-03-08 DIAGNOSIS — I10 Essential (primary) hypertension: Secondary | ICD-10-CM

## 2019-03-09 ENCOUNTER — Other Ambulatory Visit: Payer: Self-pay

## 2019-03-09 ENCOUNTER — Ambulatory Visit (INDEPENDENT_AMBULATORY_CARE_PROVIDER_SITE_OTHER): Payer: Medicare Other | Admitting: Internal Medicine

## 2019-03-09 ENCOUNTER — Encounter: Payer: Self-pay | Admitting: Internal Medicine

## 2019-03-09 VITALS — BP 115/69 | Ht 69.0 in | Wt 226.0 lb

## 2019-03-09 DIAGNOSIS — Z7189 Other specified counseling: Secondary | ICD-10-CM | POA: Diagnosis not present

## 2019-03-09 DIAGNOSIS — E785 Hyperlipidemia, unspecified: Secondary | ICD-10-CM

## 2019-03-09 DIAGNOSIS — K7460 Unspecified cirrhosis of liver: Secondary | ICD-10-CM

## 2019-03-09 DIAGNOSIS — K760 Fatty (change of) liver, not elsewhere classified: Secondary | ICD-10-CM

## 2019-03-09 DIAGNOSIS — R059 Cough, unspecified: Secondary | ICD-10-CM

## 2019-03-09 DIAGNOSIS — K219 Gastro-esophageal reflux disease without esophagitis: Secondary | ICD-10-CM

## 2019-03-09 DIAGNOSIS — E119 Type 2 diabetes mellitus without complications: Secondary | ICD-10-CM | POA: Diagnosis not present

## 2019-03-09 DIAGNOSIS — D369 Benign neoplasm, unspecified site: Secondary | ICD-10-CM

## 2019-03-09 DIAGNOSIS — R05 Cough: Secondary | ICD-10-CM | POA: Diagnosis not present

## 2019-03-09 DIAGNOSIS — Z125 Encounter for screening for malignant neoplasm of prostate: Secondary | ICD-10-CM | POA: Diagnosis not present

## 2019-03-09 DIAGNOSIS — E1169 Type 2 diabetes mellitus with other specified complication: Secondary | ICD-10-CM

## 2019-03-09 DIAGNOSIS — I10 Essential (primary) hypertension: Secondary | ICD-10-CM

## 2019-03-09 MED ORDER — BENZONATATE 200 MG PO CAPS: 200.0000 mg | ORAL_CAPSULE | Freq: Two times a day (BID) | ORAL | 0 refills | Status: DC | PRN

## 2019-03-09 MED ORDER — PREDNISONE 10 MG PO TABS: ORAL_TABLET | ORAL | 0 refills | Status: DC

## 2019-03-09 NOTE — Patient Instructions (Addendum)
Prednisone taper and tessalone perles   For your  Cough  You can use Benadryl but you should also consider adding one of these newer second generation antihistamines that are longer acting, non sedating and  available OTC:  Generic  Zyrtec, which is cetirizine.    generic Allegra , available generically as fexofenadine ; comes in 60 mg and 180 mg once daily strengths.    Generic Claritin :  also available as loratidine .   Fasting labs needed   Referral to local gastroenterology

## 2019-03-09 NOTE — Assessment & Plan Note (Signed)
LDL and triglycerides are at goal on atorvastatin 20 mg.  He has cirrhosis secondary to NASH, by MRI abdomen done in 2019,   But   liver enzymes have normalized . Repeat lbas are due  Lab Results  Component Value Date   CHOL 99 03/06/2018   HDL 36.20 (L) 03/06/2018   LDLCALC 32 03/06/2018   LDLDIRECT 59.0 03/26/2016   TRIG 154.0 (H) 03/06/2018   CHOLHDL 3 03/06/2018   Lab Results  Component Value Date   ALT 27 09/05/2018   AST 20 09/05/2018   ALKPHOS 63 03/06/2018   BILITOT 0.8 09/05/2018

## 2019-03-09 NOTE — Assessment & Plan Note (Signed)
He has a history fo severe reaction to influenza vaccine (hives)  Advised to avoid COVID 19 vaccination

## 2019-03-09 NOTE — Assessment & Plan Note (Signed)
Recurrent , mild present for several weeks without other symptoms suggestive of COVID 19.   Prednisone taper, tessalon perles.  If no improvement in 2 weeks,  Chest x ray.  Quit smoking 5 or more years ago

## 2019-03-09 NOTE — Progress Notes (Signed)
Virtual Visit via [employer] Note  This visit type was conducted due to national recommendations for restrictions regarding the COVID-19 pandemic (e.g. social distancing).  This format is felt to be most appropriate for this patient at this time.  All issues noted in this document were discussed and addressed.  No physical exam was performed (except for noted visual exam findings with Video Visits).   I connected with@ on 03/09/19 at  2:30 PM EST by a video enabled telemedicine application and verified that I am speaking with the correct person using two identifiers. Location patient: home Location provider: work or home office Persons participating in the virtual visit: patient, provider  I discussed the limitations, risks, security and privacy concerns of performing an evaluation and management service by telephone and the availability of in person appointments. I also discussed with the patient that there may be a patient responsible charge related to this service. The patient expressed understanding and agreed to proceed.  Reason for visit: follow up  HPI:  Follow up on multiple chronic conditions   T2DM:   He feels generally well, is exercising several times per week and checking blood sugars once daily at variable times.  BS have been under 130 fasting and < 150 post prandially.  Denies any recent hypoglyemic events.  Taking his medications as directed. Following a carbohydrate modified diet 6 days per week. Denies numbness, burning and tingling of extremities. Appetite is good.    Hypertension: patient checks blood pressure twice weekly at home.  Readings have been for the most part < 120/80 at rest . Patient is following a reduce salt diet most days and is taking medications as prescribed  Intermittent Slightly productive cough that is mild and has been present since late December .  No fevers , sinus congestion, or body aches,  No shortness of breath or wheezing. .  Occurs annually. Only  leaves house for church , grocery shopping,  But wears a mask  Taking omeprazole bid  For GERD /Barrett's esophagus .  Cirrhosis, non alcoholic:  He denies all symptoms of cirrhosis.  He has chosen not to return to [facility] for management .  He is overdue for imaging.    ROS: See pertinent positives and negatives per HPI.  Past Medical History:  Diagnosis Date  . Barrett's esophagus 2004   last EGD 2007, no metaplasia, due in 2009  . Diabetes mellitus   . Fatty liver disease, nonalcoholic   . GERD (gastroesophageal reflux disease)   . Hyperplastic colonic polyp 2004  . Lumbar disc herniation 2007   L5  . Transaminitis     Past Surgical History:  Procedure Laterality Date  . SMALL INTESTINE SURGERY     carcinoid tumore, diagnosis disputed  . TONSILLECTOMY  age 75    Family History  Problem Relation Age of Onset  . Hypertension Mother     SOCIAL HX:  reports that he quit smoking about 33 years ago. His smoking use included cigarettes. He has never used smokeless tobacco. He reports current alcohol use. He reports that he does not use drugs.   Current Outpatient Medications:  .  atorvastatin (LIPITOR) 20 MG tablet, TAKE 1 TABLET BY MOUTH EVERY DAY, Disp: 90 tablet, Rfl: 1 .  Garlic [id number] MG CAPS, Take by mouth., Disp: , Rfl:  .  glucose blood test strip, Use to check blood sugars once daily, Disp: 100 each, Rfl: 12 .  JANUVIA 50 MG tablet, TAKE 1 TABLET BY MOUTH EVERY DAY,  Disp: 90 tablet, Rfl: 3 .  Lactobacillus (PROBIOTIC ACIDOPHILUS PO), Take 1 capsule by mouth daily., Disp: , Rfl:  .  Lancets (ONETOUCH ULTRASOFT) lancets, Use as instructed, Disp: 100 each, Rfl: 12 .  losartan (COZAAR) 50 MG tablet, TAKE 1 TABLET BY MOUTH EVERY DAY, Disp: 90 tablet, Rfl: 1 .  metFORMIN (GLUCOPHAGE) 500 MG tablet, Take 1 tablet (500 mg total) by mouth 2 (two) times daily with a meal., Disp: 180 tablet, Rfl: 3 .  Multiple Vitamins-Minerals (MULTIVITAMIN WITH MINERALS) tablet, Take 1 tablet by  mouth daily., Disp: , Rfl:  .  Multiple Vitamins-Minerals (PRESERVISION AREDS 2 PO), Take 2 tablets by mouth daily., Disp: , Rfl:  .  Omega-3 Fatty Acids (FISH OIL) 1000 MG CAPS, Take 1,000 mg by mouth daily., Disp: , Rfl:  .  omeprazole (PRILOSEC) 20 MG capsule, TAKE 1 CAPSULE BY MOUTH TWICE A DAY, Disp: 180 capsule, Rfl: 1 .  Turmeric 400 MG CAPS, Take 1 capsule by mouth daily., Disp: , Rfl:  .  benzonatate (TESSALON) 200 MG capsule, Take 1 capsule (200 mg total) by mouth 2 (two) times daily as needed for cough., Disp: 20 capsule, Rfl: 0 .  nizatidine (AXID) 300 MG capsule, TAKE 1 CAPSULE BY MOUTH AT BEDTIME. (Patient not taking: Reported on 09/05/2018), Disp: 90 capsule, Rfl: 1 .  predniSONE (DELTASONE) 10 MG tablet, 6 tablets on Day 1 , then reduce by 1 tablet daily until gone, Disp: 21 tablet, Rfl: 0  EXAM:  VITALS per patient if applicable:  GENERAL: alert, oriented, appears well and in no acute distress.  Has not coughed at all during interview/exam   HEENT: atraumatic, conjunttiva clear, no obvious abnormalities on inspection of external nose and ears  NECK: normal movements of the head and neck  LUNGS: on inspection no signs of respiratory distress, breathing rate appears normal, no obvious gross SOB, gasping or wheezing  CV: no obvious cyanosis  MS: moves all visible extremities without noticeable abnormality  PSYCH/NEURO: pleasant and cooperative, no obvious depression or anxiety, speech and thought processing grossly intact  ASSESSMENT AND PLAN:  Discussed the following assessment and plan:  Hyperlipidemia associated with type 2 diabetes mellitus (HCC) - Plan: Lipid panel  Diabetes mellitus without complication ([place]) - Plan: Hemoglobin A1c, Microalbumin / creatinine urine ratio  NAFLD (nonalcoholic fatty liver disease) - Plan: US LIVER DOPPLER  Essential hypertension - Plan: Comprehensive metabolic panel  Prostate cancer screening - Plan: PSA, Medicare  Cough in  adult patient - Plan: CBC with Differential/Platelet  Cirrhosis of liver without ascites, unspecified hepatic cirrhosis type ([place]) - Plan: AFP tumor marker, US LIVER DOPPLER  Gastroesophageal reflux disease without esophagitis  Cough in adult  Advice given about COVID-19 virus by telephone  Adenomatous polyps  Diabetes mellitus without complication ([place]) Appears to have reasonable  control on current regimen of metformin and Januvia ,  but needs repeat a1c now for being not  at goal of A1c , 7.0 Lab Results  Component Value Date   HGBA1C 7.5 (H) 09/05/2018     GERD (gastroesophageal reflux disease) Managed with H 2 blocker Axid bid   Cough in adult Recurrent , mild present for several weeks without other symptoms suggestive of COVID 19.   Prednisone taper, tessalon perles.  If no improvement in 2 weeks,  Chest x ray.  Quit smoking 5 or more years ago   Hyperlipidemia associated with type 2 diabetes mellitus (HCC) LDL and triglycerides are at goal on atorvastatin 20 mg.  He has cirrhosis secondary to NASH, by MRI abdomen done in 2019,   But   liver enzymes have normalized . Repeat lbas are due  Lab Results  Component Value Date   CHOL 99 03/06/2018   HDL 36.20 (L) 03/06/2018   LDLCALC 32 03/06/2018   LDLDIRECT 59.0 03/26/2016   TRIG 154.0 (H) 03/06/2018   CHOLHDL 3 03/06/2018   Lab Results  Component Value Date   ALT 27 09/05/2018   AST 20 09/05/2018   ALKPHOS 63 03/06/2018   BILITOT 0.8 09/05/2018     Advice given about COVID-19 virus by telephone He has a history fo severe reaction to influenza vaccine (hives)  Advised to avoid COVID 19 vaccination   Adenomatous polyps He is overdue for 3 yr follow up.  Local referral made   NAFLD (nonalcoholic fatty liver disease) With cirrhosis by prior evaluation at [facility]  [place], MRI done in 2019 at [facility] did not note cirrhosis or splenomegaly.   Annual AFP tumor marker and liver doppler ultrasound as been ordered     I  discussed the assessment and treatment plan with the patient. The patient was provided an opportunity to ask questions and all were answered. The patient agreed with the plan and demonstrated an understanding of the instructions.   The patient was advised to call back or seek an in-person evaluation if the symptoms worsen or if the condition fails to improve as anticipated.    Crecencio Mc, MD

## 2019-03-09 NOTE — Assessment & Plan Note (Addendum)
He is overdue for 3 yr follow up.  Local referral made

## 2019-03-09 NOTE — Assessment & Plan Note (Signed)
Managed with H 2 blocker Axid bid

## 2019-03-09 NOTE — Assessment & Plan Note (Signed)
Appears to have reasonable  control on current regimen of metformin and Januvia ,  but needs repeat a1c now for being not  at goal of A1c , 7.0 Lab Results  Component Value Date   HGBA1C 7.5 (H) 09/05/2018

## 2019-03-10 ENCOUNTER — Other Ambulatory Visit: Payer: Self-pay | Admitting: Internal Medicine

## 2019-03-10 NOTE — Assessment & Plan Note (Addendum)
With cirrhosis by prior evaluation at [facility].  [place], MRI done in 2019 at [facility] did not note cirrhosis or splenomegaly.   Annual AFP tumor marker and liver doppler ultrasound as been ordered

## 2019-03-13 ENCOUNTER — Other Ambulatory Visit: Payer: Self-pay

## 2019-03-13 ENCOUNTER — Other Ambulatory Visit (INDEPENDENT_AMBULATORY_CARE_PROVIDER_SITE_OTHER): Payer: Medicare Other

## 2019-03-13 DIAGNOSIS — I10 Essential (primary) hypertension: Secondary | ICD-10-CM | POA: Diagnosis not present

## 2019-03-13 DIAGNOSIS — D72829 Elevated white blood cell count, unspecified: Secondary | ICD-10-CM

## 2019-03-13 DIAGNOSIS — K7460 Unspecified cirrhosis of liver: Secondary | ICD-10-CM | POA: Diagnosis not present

## 2019-03-13 DIAGNOSIS — Z125 Encounter for screening for malignant neoplasm of prostate: Secondary | ICD-10-CM | POA: Diagnosis not present

## 2019-03-13 DIAGNOSIS — E785 Hyperlipidemia, unspecified: Secondary | ICD-10-CM

## 2019-03-13 DIAGNOSIS — E119 Type 2 diabetes mellitus without complications: Secondary | ICD-10-CM

## 2019-03-13 DIAGNOSIS — E1169 Type 2 diabetes mellitus with other specified complication: Secondary | ICD-10-CM | POA: Diagnosis not present

## 2019-03-13 DIAGNOSIS — R05 Cough: Secondary | ICD-10-CM | POA: Diagnosis not present

## 2019-03-13 DIAGNOSIS — R059 Cough, unspecified: Secondary | ICD-10-CM

## 2019-03-13 LAB — COMPREHENSIVE METABOLIC PANEL
ALT: 29 U/L (ref 0–53)
AST: 19 U/L (ref 0–37)
Albumin: 4.6 g/dL (ref 3.5–5.2)
Alkaline Phosphatase: 57 U/L (ref 39–117)
BUN: 15 mg/dL (ref 6–23)
CO2: 27 mEq/L (ref 19–32)
Calcium: 9.7 mg/dL (ref 8.4–10.5)
Chloride: 95 mEq/L — ABNORMAL LOW (ref 96–112)
Creatinine, Ser: 1.05 mg/dL (ref 0.40–1.50)
GFR: 69.02 mL/min (ref >60.00)
Glucose, Bld: 197 mg/dL — ABNORMAL HIGH (ref 70–99)
Potassium: 4.7 mEq/L (ref 3.5–5.1)
Sodium: 134 mEq/L — ABNORMAL LOW (ref 135–145)
Total Bilirubin: 0.8 mg/dL (ref 0.2–1.2)
Total Protein: 7.1 g/dL (ref 6.0–8.3)

## 2019-03-13 LAB — LIPID PANEL
Cholesterol: 114 mg/dL (ref 0–200)
HDL: 38.3 mg/dL — ABNORMAL LOW (ref >39.00)
LDL Cholesterol: 48 mg/dL (ref 0–99)
NonHDL: 76.05
Total CHOL/HDL Ratio: 3
Triglycerides: 141 mg/dL (ref 0.0–149.0)
VLDL: 28.2 mg/dL (ref 0.0–40.0)

## 2019-03-13 LAB — CBC WITH DIFFERENTIAL/PLATELET
Basophils Absolute: 0 10*3/uL (ref 0.0–0.1)
Basophils Relative: 0.2 % (ref 0.0–3.0)
Eosinophils Absolute: 0 10*3/uL (ref 0.0–0.7)
Eosinophils Relative: 0.2 % (ref 0.0–5.0)
HCT: 42.4 % (ref 39.0–52.0)
Hemoglobin: 14.6 g/dL (ref 13.0–17.0)
Lymphocytes Relative: 6.1 % — ABNORMAL LOW (ref 12.0–46.0)
Lymphs Abs: 0.9 10*3/uL (ref 0.7–4.0)
MCHC: 34.5 g/dL (ref 30.0–36.0)
MCV: 86.7 fl (ref 78.0–100.0)
Monocytes Absolute: 0.3 10*3/uL (ref 0.1–1.0)
Monocytes Relative: 1.7 % — ABNORMAL LOW (ref 3.0–12.0)
Neutro Abs: 14.1 10*3/uL — ABNORMAL HIGH (ref 1.4–7.7)
Neutrophils Relative %: 91.8 % — ABNORMAL HIGH (ref 43.0–77.0)
Platelets: 257 10*3/uL (ref 150.0–400.0)
RBC: 4.89 Mil/uL (ref 4.22–5.81)
RDW: 13.9 % (ref 11.5–15.5)
WBC: 15.3 10*3/uL — ABNORMAL HIGH (ref 4.0–10.5)

## 2019-03-13 LAB — PSA, MEDICARE: PSA: 1.35 ng/ml (ref 0.10–4.00)

## 2019-03-13 LAB — MICROALBUMIN / CREATININE URINE RATIO
Creatinine,U: 59.9 mg/dL
Microalb Creat Ratio: 1.2 mg/g (ref 0.0–30.0)
Microalb, Ur: <0.7 mg/dL (ref 0.0–1.9)

## 2019-03-13 LAB — HEMOGLOBIN A1C: Hgb A1c MFr Bld: 6.4 % (ref 4.6–6.5)

## 2019-03-16 LAB — AFP TUMOR MARKER: AFP-Tumor Marker: 2.5 ng/mL (ref <6.1)

## 2019-03-19 ENCOUNTER — Ambulatory Visit: Payer: Medicare Other

## 2019-03-23 NOTE — Addendum Note (Signed)
Addended by: Crecencio Mc on: 03/23/2019 09:11 AM   Modules accepted: Orders

## 2019-03-26 ENCOUNTER — Other Ambulatory Visit: Payer: Self-pay

## 2019-03-26 ENCOUNTER — Ambulatory Visit
Admission: RE | Admit: 2019-03-26 | Discharge: 2019-03-26 | Disposition: A | Discharge status: Home / Self Care | Payer: Medicare Other | Source: Ambulatory Visit | Attending: Internal Medicine | Admitting: Internal Medicine

## 2019-03-26 DIAGNOSIS — K7460 Unspecified cirrhosis of liver: Secondary | ICD-10-CM | POA: Diagnosis not present

## 2019-03-26 DIAGNOSIS — K760 Fatty (change of) liver, not elsewhere classified: Secondary | ICD-10-CM | POA: Insufficient documentation

## 2019-04-10 ENCOUNTER — Other Ambulatory Visit: Payer: Self-pay | Admitting: Internal Medicine

## 2019-05-06 ENCOUNTER — Ambulatory Visit
Admission: EM | Admit: 2019-05-06 | Discharge: 2019-05-06 | Disposition: A | Discharge status: Home / Self Care | Payer: Medicare Other | Attending: Emergency Medicine | Admitting: Emergency Medicine

## 2019-05-06 ENCOUNTER — Other Ambulatory Visit: Payer: Self-pay

## 2019-05-06 DIAGNOSIS — R5383 Other fatigue: Secondary | ICD-10-CM | POA: Diagnosis not present

## 2019-05-06 DIAGNOSIS — U071 COVID-19: Secondary | ICD-10-CM | POA: Diagnosis not present

## 2019-05-06 DIAGNOSIS — M791 Myalgia, unspecified site: Secondary | ICD-10-CM

## 2019-05-06 DIAGNOSIS — R05 Cough: Secondary | ICD-10-CM

## 2019-05-06 LAB — POC SARS CORONAVIRUS 2 AG -  ED: SARS Coronavirus 2 Ag: POSITIVE — AB

## 2019-05-06 MED ORDER — BENZONATATE 100 MG PO CAPS: 100.0000 mg | ORAL_CAPSULE | Freq: Three times a day (TID) | ORAL | 0 refills | Status: DC | PRN

## 2019-05-06 NOTE — Discharge Instructions (Addendum)
Your COVID test is positive.  You have been referred to the [facility].  You should receive a call from them this afternoon or tomorrow.  You should self-quarantine for:  *10 days since your symptoms first appeared and  *24 hours with no fever, without the use of fever-reducing medications and  *your other symptoms of COVID are improving.  Most people do not need to be re-tested at the end of the quarantine period.    Go to the emergency department if you have high fever not relieved by Tylenol, shortness of breath, severe diarrhea, or other concerning symptoms.

## 2019-05-06 NOTE — ED Triage Notes (Signed)
Patient states that he has been coughing x 3 days with body aches. Patient states that wife is also sick.

## 2019-05-06 NOTE — ED Provider Notes (Signed)
Chad Gill    CSN: [id number] Arrival date & time: 05/06/19  1130      History   Chief Complaint Chief Complaint  Patient presents with  . Cough    HPI Chad Gill is a 74 y.o. male.   Patient presents with 3-day history of nonproductive cough, body aches, fatigue.  He denies fever, chills, sore throat, shortness of breath vomiting, diarrhea, rash, or other symptoms.  Treatment attempted at home with OTC cough medication.    The history is provided by the patient.    Past Medical History:  Diagnosis Date  . Barrett's esophagus 2004   last EGD 2007, no metaplasia, due in 2009  . Diabetes mellitus   . Fatty liver disease, nonalcoholic   . GERD (gastroesophageal reflux disease)   . Hyperplastic colonic polyp 2004  . Lumbar disc herniation 2007   L5  . Transaminitis     Patient Active Problem List   Diagnosis Date Noted  . Advice given about COVID-19 virus by telephone 03/09/2019  . Cough in adult 01/16/2016  . Hyperlipidemia associated with type 2 diabetes mellitus ([place]) 04/16/2015  . NAFLD (nonalcoholic fatty liver disease) 01/17/2015  . Medicare annual wellness visit, subsequent 10/12/2014  . Uses hearing aid 09/10/2013  . Tinnitus of both ears 06/03/2013  . Diabetes mellitus without complication ([place]) [id number]  . Hypertension 01/30/2012  . Obesity (BMI 30-39.9) 07/24/2011  . GERD (gastroesophageal reflux disease)   . Adenomatous polyps     Past Surgical History:  Procedure Laterality Date  . SMALL INTESTINE SURGERY     carcinoid tumore, diagnosis disputed  . TONSILLECTOMY  age 60       Home Medications    Prior to Admission medications   Medication Sig Start Date End Date Taking? Authorizing Provider  atorvastatin (LIPITOR) 20 MG tablet TAKE 1 TABLET BY MOUTH EVERY DAY 04/10/19  Yes Crecencio Mc, MD  Garlic [id number] MG CAPS Take by mouth.   Yes [provider]  glucose blood test strip Use to check blood sugars once daily  06/04/17  Yes Crecencio Mc, MD  JANUVIA 50 MG tablet TAKE 1 TABLET BY MOUTH EVERY DAY 12/01/18  Yes Crecencio Mc, MD  Lactobacillus (PROBIOTIC ACIDOPHILUS PO) Take 1 capsule by mouth daily.   Yes [provider]  Lancets [facility] ULTRASOFT) lancets Use as instructed 05/07/17  Yes Crecencio Mc, MD  losartan (COZAAR) 50 MG tablet TAKE 1 TABLET BY MOUTH EVERY DAY 03/09/19  Yes Crecencio Mc, MD  metFORMIN (GLUCOPHAGE) 500 MG tablet Take 1 tablet (500 mg total) by mouth 2 (two) times daily with a meal. 12/08/18  Yes Crecencio Mc, MD  Multiple Vitamins-Minerals (MULTIVITAMIN WITH MINERALS) tablet Take 1 tablet by mouth daily.   Yes [provider]  Multiple Vitamins-Minerals (PRESERVISION AREDS 2 PO) Take 2 tablets by mouth daily.   Yes [provider]  Omega-3 Fatty Acids (FISH OIL) 1000 MG CAPS Take 1,000 mg by mouth daily.   Yes [provider]  omeprazole (PRILOSEC) 20 MG capsule TAKE 1 CAPSULE BY MOUTH TWICE A DAY 12/08/18  Yes Crecencio Mc, MD  Turmeric 400 MG CAPS Take 1 capsule by mouth daily.   Yes [provider]  benzonatate (TESSALON) 100 MG capsule Take 1 capsule (100 mg total) by mouth 3 (three) times daily as needed for cough. 05/06/19   Sharion Balloon, NP  nizatidine (AXID) 300 MG capsule TAKE 1 CAPSULE BY MOUTH AT  BEDTIME. Patient not taking: Reported on 09/05/2018 06/16/18   Crecencio Mc, MD  predniSONE (DELTASONE) 10 MG tablet 6 tablets on Day 1 , then reduce by 1 tablet daily until gone 03/09/19   Crecencio Mc, MD    Family History Family History  Problem Relation Age of Onset  . Hypertension Mother     Social History Social History   Tobacco Use  . Smoking status: Former Smoker    Types: Cigarettes    Quit date: 01/21/1986    Years since quitting: 33.3  . Smokeless tobacco: Never Used  Substance Use Topics  . Alcohol use: Yes    Comment: ocassional beer  . Drug use: No     Allergies   Other and  Influenza vac split [flu virus vaccine]   Review of Systems Review of Systems  Constitutional: Positive for fatigue. Negative for chills and fever.  HENT: Negative for ear pain and sore throat.   Eyes: Negative for pain and visual disturbance.  Respiratory: Positive for cough. Negative for shortness of breath.   Cardiovascular: Negative for chest pain and palpitations.  Gastrointestinal: Negative for abdominal pain, diarrhea, nausea and vomiting.  Genitourinary: Negative for dysuria and hematuria.  Musculoskeletal: Negative for arthralgias and back pain.  Skin: Negative for color change and rash.  Neurological: Negative for seizures and syncope.  All other systems reviewed and are negative.    Physical Exam Triage Vital Signs ED Triage Vitals  Enc Vitals Group     BP 05/06/19 1139 (!) 147/79     Pulse Rate 05/06/19 1139 93     Resp 05/06/19 1139 15     Temp 05/06/19 1139 99.9 F (37.7 C)     Temp Source 05/06/19 1139 Oral     SpO2 05/06/19 1139 92 %     Weight 05/06/19 1140 225 lb (102.1 kg)     Height 05/06/19 1140 5\' 10"  (1.778 m)     Head Circumference --      Peak Flow --      Pain Score 05/06/19 1139 2     Pain Loc --      Pain Edu? --      Excl. in [place]? --    No data found.  Updated Vital Signs BP (!) 147/79 (BP Location: Right Arm)   Pulse 93   Temp 99.9 F (37.7 C) (Oral)   Resp 15   Ht 5\' 10"  (1.778 m)   Wt 225 lb (102.1 kg)   SpO2 94%   BMI 32.28 kg/m   Visual Acuity Right Eye Distance:   Left Eye Distance:   Bilateral Distance:    Right Eye Near:   Left Eye Near:    Bilateral Near:     Physical Exam Vitals and nursing note reviewed.  Constitutional:      General: He is not in acute distress.    Appearance: He is well-developed.  HENT:     Head: Normocephalic and atraumatic.     Mouth/Throat:     Mouth: Mucous membranes are moist.     Pharynx: Oropharynx is clear.  Eyes:     Conjunctiva/sclera: Conjunctivae normal.  Cardiovascular:      Rate and Rhythm: Normal rate and regular rhythm.     Heart sounds: No murmur.  Pulmonary:     Effort: Pulmonary effort is normal. No respiratory distress.     Breath sounds: Normal breath sounds. No wheezing or rhonchi.  Abdominal:     Palpations: Abdomen is  soft.     Tenderness: There is no abdominal tenderness. There is no guarding or rebound.  Musculoskeletal:     Cervical back: Neck supple.  Skin:    General: Skin is warm and dry.     Findings: No rash.  Neurological:     General: No focal deficit present.     Mental Status: He is alert and oriented to person, place, and time.     Gait: Gait normal.  Psychiatric:        Mood and Affect: Mood normal.        Behavior: Behavior normal.      UC Treatments / Results  Labs (all labs ordered are listed, but only abnormal results are displayed) Labs Reviewed  POC SARS CORONAVIRUS 2 AG -  ED - Abnormal; Notable for the following components:      Result Value   SARS Coronavirus 2 Ag Positive (*)    All other components within normal limits    EKG   Radiology No results found.  Procedures Procedures (including critical care time)  Medications Ordered in UC Medications - No data to display  Initial Impression / Assessment and Plan / UC Course  I have reviewed the triage vital signs and the nursing notes.  Pertinent labs & imaging results that were available during my care of the patient were reviewed by me and considered in my medical decision making (see chart for details).   COVID-19.  POC COVID positive.  Treating cough with Tessalon Perles.  Patient referred to [facility].  Instructed patient to self quarantine per CDC guidelines.  Instructed patient to go to the emergency department if he develops high fever, shortness of breath, severe diarrhea, or other concerning symptoms.  Instructed him to notify his PCP of his COVID positive diagnosis.  Patient agrees to plan of care.   Final Clinical  Impressions(s) / UC Diagnoses   Final diagnoses:  [id number]     Discharge Instructions     Your COVID test is positive.  You have been referred to the [facility].  You should receive a call from them this afternoon or tomorrow.  You should self-quarantine for:  *10 days since your symptoms first appeared and  *24 hours with no fever, without the use of fever-reducing medications and  *your other symptoms of COVID are improving.  Most people do not need to be re-tested at the end of the quarantine period.    Go to the emergency department if you have high fever not relieved by Tylenol, shortness of breath, severe diarrhea, or other concerning symptoms.       ED Prescriptions    Medication Sig Dispense Auth. Provider   benzonatate (TESSALON) 100 MG capsule Take 1 capsule (100 mg total) by mouth 3 (three) times daily as needed for cough. 21 capsule Sharion Balloon, NP     PDMP not reviewed this encounter.   Sharion Balloon, NP 05/06/19 1204

## 2019-05-07 ENCOUNTER — Telehealth: Payer: Self-pay | Admitting: Physician Assistant

## 2019-05-07 ENCOUNTER — Other Ambulatory Visit: Payer: Medicare Other

## 2019-05-07 NOTE — Telephone Encounter (Signed)
Called to discuss with patient about Covid symptoms and the use of bamlanivimab or casirivimab/imdevimab, a monoclonal antibody infusion for those with mild to moderate Covid symptoms and at a high risk of hospitalization.  Pt is qualified for this infusion at the [facility] infusion center due to Age > 70   He and his wife qualify but live in [place]. They will reach out to [facility] infusion clinic. IF they cannot get infused there, they will call us back.   Angelena Form PA-C  [facility]

## 2019-05-08 DIAGNOSIS — U071 COVID-19: Secondary | ICD-10-CM | POA: Diagnosis not present

## 2019-05-08 DIAGNOSIS — J929 Pleural plaque without asbestos: Secondary | ICD-10-CM | POA: Diagnosis not present

## 2019-05-08 DIAGNOSIS — J40 Bronchitis, not specified as acute or chronic: Secondary | ICD-10-CM | POA: Diagnosis not present

## 2019-05-11 DIAGNOSIS — U071 COVID-19: Secondary | ICD-10-CM | POA: Diagnosis not present

## 2019-05-14 DIAGNOSIS — U071 COVID-19: Secondary | ICD-10-CM | POA: Diagnosis not present

## 2019-05-14 DIAGNOSIS — J9811 Atelectasis: Secondary | ICD-10-CM | POA: Diagnosis not present

## 2019-05-28 ENCOUNTER — Telehealth: Payer: Self-pay | Admitting: Internal Medicine

## 2019-05-28 ENCOUNTER — Encounter: Payer: Self-pay | Admitting: Emergency Medicine

## 2019-05-28 ENCOUNTER — Ambulatory Visit
Admission: EM | Admit: 2019-05-28 | Discharge: 2019-05-28 | Disposition: A | Discharge status: Home / Self Care | Payer: Medicare Other | Attending: Emergency Medicine | Admitting: Emergency Medicine

## 2019-05-28 ENCOUNTER — Other Ambulatory Visit: Payer: Self-pay

## 2019-05-28 DIAGNOSIS — R059 Cough, unspecified: Secondary | ICD-10-CM

## 2019-05-28 DIAGNOSIS — R05 Cough: Secondary | ICD-10-CM

## 2019-05-28 DIAGNOSIS — R300 Dysuria: Secondary | ICD-10-CM

## 2019-05-28 LAB — POCT URINALYSIS DIP (MANUAL ENTRY)
Bilirubin, UA: NEGATIVE
Blood, UA: NEGATIVE
Glucose, UA: NEGATIVE mg/dL
Ketones, POC UA: NEGATIVE mg/dL
Leukocytes, UA: NEGATIVE
Nitrite, UA: NEGATIVE
Protein Ur, POC: NEGATIVE mg/dL
Spec Grav, UA: 1.025 (ref 1.010–1.025)
Urobilinogen, UA: 0.2 E.U./dL
pH, UA: 5.5 (ref 5.0–8.0)

## 2019-05-28 NOTE — ED Provider Notes (Signed)
Chad Gill    CSN: [id number] Arrival date & time: 05/28/19  1326      History   Chief Complaint Chief Complaint  Patient presents with  . Dysuria    HPI Chad Gill is a 74 y.o. male.   Patient presents with a 1 week history of dysuria, frequency, decreased urine stream.  He states his symptoms have improved over the last couple of days but he would like to be checked for a urine infection.  He denies fever, chills, abdominal pain, back pain, or other symptoms.  No treatments attempted at home.  COVID positive on 05/06/2019.  Patient also reports a residual nonproductive cough since being diagnosed with COVID.  He denies SOB.    The history is provided by the patient.    Past Medical History:  Diagnosis Date  . Barrett's esophagus 2004   last EGD 2007, no metaplasia, due in 2009  . Diabetes mellitus   . Fatty liver disease, nonalcoholic   . GERD (gastroesophageal reflux disease)   . Hyperplastic colonic polyp 2004  . Lumbar disc herniation 2007   L5  . Transaminitis     Patient Active Problem List   Diagnosis Date Noted  . Advice given about COVID-19 virus by telephone 03/09/2019  . Cough in adult 01/16/2016  . Hyperlipidemia associated with type 2 diabetes mellitus ([place]) 04/16/2015  . NAFLD (nonalcoholic fatty liver disease) 01/17/2015  . Medicare annual wellness visit, subsequent 10/12/2014  . Uses hearing aid 09/10/2013  . Tinnitus of both ears 06/03/2013  . Diabetes mellitus without complication ([place]) [id number]  . Hypertension 01/30/2012  . Obesity (BMI 30-39.9) 07/24/2011  . GERD (gastroesophageal reflux disease)   . Adenomatous polyps     Past Surgical History:  Procedure Laterality Date  . SMALL INTESTINE SURGERY     carcinoid tumore, diagnosis disputed  . TONSILLECTOMY  age 1       Home Medications    Prior to Admission medications   Medication Sig Start Date End Date Taking? Authorizing Provider  atorvastatin (LIPITOR) 20 MG  tablet TAKE 1 TABLET BY MOUTH EVERY DAY 04/10/19  Yes Crecencio Mc, MD  Garlic [id number] MG CAPS Take by mouth.   Yes [provider]  glucose blood test strip Use to check blood sugars once daily 06/04/17  Yes Crecencio Mc, MD  JANUVIA 50 MG tablet TAKE 1 TABLET BY MOUTH EVERY DAY 12/01/18  Yes Crecencio Mc, MD  Lactobacillus (PROBIOTIC ACIDOPHILUS PO) Take 1 capsule by mouth daily.   Yes [provider]  Lancets [facility] ULTRASOFT) lancets Use as instructed 05/07/17  Yes Crecencio Mc, MD  losartan (COZAAR) 50 MG tablet TAKE 1 TABLET BY MOUTH EVERY DAY 03/09/19  Yes Crecencio Mc, MD  metFORMIN (GLUCOPHAGE) 500 MG tablet Take 1 tablet (500 mg total) by mouth 2 (two) times daily with a meal. 12/08/18  Yes Crecencio Mc, MD  Multiple Vitamins-Minerals (MULTIVITAMIN WITH MINERALS) tablet Take 1 tablet by mouth daily.   Yes [provider]  Multiple Vitamins-Minerals (PRESERVISION AREDS 2 PO) Take 2 tablets by mouth daily.   Yes [provider]  Omega-3 Fatty Acids (FISH OIL) 1000 MG CAPS Take 1,000 mg by mouth daily.   Yes [provider]  omeprazole (PRILOSEC) 20 MG capsule TAKE 1 CAPSULE BY MOUTH TWICE A DAY 12/08/18  Yes Crecencio Mc, MD  Turmeric 400 MG CAPS Take 1 capsule by mouth daily.   Yes [provider]  benzonatate (TESSALON) 100 MG capsule Take 1 capsule (100 mg total) by mouth 3 (three) times daily as needed for cough. 05/06/19   Sharion Balloon, NP  nizatidine (AXID) 300 MG capsule TAKE 1 CAPSULE BY MOUTH AT BEDTIME. Patient not taking: Reported on 09/05/2018 06/16/18   Crecencio Mc, MD  predniSONE (DELTASONE) 10 MG tablet 6 tablets on Day 1 , then reduce by 1 tablet daily until gone 03/09/19   Crecencio Mc, MD    Family History Family History  Problem Relation Age of Onset  . Hypertension Mother     Social History Social History   Tobacco Use  . Smoking status: Former Smoker    Types: Cigarettes    Quit  date: 01/21/1986    Years since quitting: 33.3  . Smokeless tobacco: Never Used  Substance Use Topics  . Alcohol use: Yes    Comment: ocassional beer  . Drug use: No     Allergies   Other and Influenza vac split [flu virus vaccine]   Review of Systems Review of Systems  Constitutional: Negative for chills and fever.  HENT: Negative for ear pain and sore throat.   Eyes: Negative for pain and visual disturbance.  Respiratory: Positive for cough. Negative for shortness of breath.   Cardiovascular: Negative for chest pain and palpitations.  Gastrointestinal: Negative for abdominal pain, diarrhea, nausea and vomiting.  Genitourinary: Positive for dysuria and frequency. Negative for discharge, hematuria and testicular pain.  Musculoskeletal: Negative for arthralgias and back pain.  Skin: Negative for color change and rash.  Neurological: Negative for seizures and syncope.  All other systems reviewed and are negative.    Physical Exam Triage Vital Signs ED Triage Vitals  Enc Vitals Group     BP      Pulse      Resp      Temp      Temp src      SpO2      Weight      Height      Head Circumference      Peak Flow      Pain Score      Pain Loc      Pain Edu?      Excl. in [place]?    No data found.  Updated Vital Signs BP (!) 147/82 (BP Location: Left Arm)   Pulse 86   Temp 98.2 F (36.8 C) (Oral)   Resp 18   Ht 5\' 10"  (1.778 m)   Wt 210 lb (95.3 kg)   SpO2 95%   BMI 30.13 kg/m   Visual Acuity Right Eye Distance:   Left Eye Distance:   Bilateral Distance:    Right Eye Near:   Left Eye Near:    Bilateral Near:     Physical Exam Vitals and nursing note reviewed.  Constitutional:      General: He is not in acute distress.    Appearance: He is well-developed. He is not ill-appearing.  HENT:     Head: Normocephalic and atraumatic.     Mouth/Throat:     Mouth: Mucous membranes are moist.     Pharynx: Oropharynx is clear.  Eyes:     Conjunctiva/sclera:  Conjunctivae normal.  Cardiovascular:     Rate and Rhythm: Normal rate and regular rhythm.     Heart sounds: No murmur.  Pulmonary:     Effort: Pulmonary effort is normal. No respiratory distress.     Breath sounds: Normal breath sounds.  No wheezing or rhonchi.  Abdominal:     General: Bowel sounds are normal.     Palpations: Abdomen is soft.     Tenderness: There is no abdominal tenderness. There is no right CVA tenderness, left CVA tenderness, guarding or rebound.  Musculoskeletal:     Cervical back: Neck supple.  Skin:    General: Skin is warm and dry.     Findings: No rash.  Neurological:     General: No focal deficit present.     Mental Status: He is alert and oriented to person, place, and time.  Psychiatric:        Mood and Affect: Mood normal.        Behavior: Behavior normal.      UC Treatments / Results  Labs (all labs ordered are listed, but only abnormal results are displayed) Labs Reviewed  POCT URINALYSIS DIP (MANUAL ENTRY)    EKG   Radiology No results found.  Procedures Procedures (including critical care time)  Medications Ordered in UC Medications - No data to display  Initial Impression / Assessment and Plan / UC Course  I have reviewed the triage vital signs and the nursing notes.  Pertinent labs & imaging results that were available during my care of the patient were reviewed by me and considered in my medical decision making (see chart for details).   Dysuria. Cough.  Patient is well-appearing and his exam is unremarkable.  Urine does not show signs of infection; No culture indicated.  Instructed patient to follow-up with his PCP if his symptoms or not improving.  Patient agrees to plan of care.     Final Clinical Impressions(s) / UC Diagnoses   Final diagnoses:  Dysuria  Cough     Discharge Instructions     Your urine does not show signs of infection.    Follow up with your primary care provider if your symptoms are not  improving.          ED Prescriptions    None     PDMP not reviewed this encounter.   Sharion Balloon, NP 05/28/19 1353

## 2019-05-28 NOTE — Telephone Encounter (Signed)
Pt's wife called he is having frequent urination and is having to go 3-4 times a night  They wanted to know what Dr.Tullo thoughts were on this and what the next thing to do is

## 2019-05-28 NOTE — Telephone Encounter (Signed)
Spoke with pt and pt's wife. They both were diagnosed with covid on 05/05/2019. They are past the 21 days but pt is still experiencing a cough. Since he is still having a cough they were advised to go to UC to be evaluated. Both pt and pt's wife gave a verbal understanding.

## 2019-05-28 NOTE — Discharge Instructions (Addendum)
Your urine does not show signs of infection.   ° °Follow up with your primary care provider if your symptoms are not improving.   ° °

## 2019-05-28 NOTE — ED Triage Notes (Signed)
Pt c/o decreased urine stream, urinary frequency, dysuria. Started about a week ago. Denies fever, lower back pain or pelvic pain.

## 2019-06-02 ENCOUNTER — Other Ambulatory Visit: Payer: Self-pay | Admitting: Internal Medicine

## 2019-06-11 ENCOUNTER — Other Ambulatory Visit: Payer: Self-pay

## 2019-06-11 ENCOUNTER — Other Ambulatory Visit (INDEPENDENT_AMBULATORY_CARE_PROVIDER_SITE_OTHER): Payer: Medicare Other

## 2019-06-11 DIAGNOSIS — D72829 Elevated white blood cell count, unspecified: Secondary | ICD-10-CM | POA: Diagnosis not present

## 2019-06-11 LAB — CBC WITH DIFFERENTIAL/PLATELET
Basophils Absolute: 0.1 10*3/uL (ref 0.0–0.1)
Basophils Relative: 0.7 % (ref 0.0–3.0)
Eosinophils Absolute: 0.2 10*3/uL (ref 0.0–0.7)
Eosinophils Relative: 2.4 % (ref 0.0–5.0)
HCT: 36.9 % — ABNORMAL LOW (ref 39.0–52.0)
Hemoglobin: 12.8 g/dL — ABNORMAL LOW (ref 13.0–17.0)
Lymphocytes Relative: 10.7 % — ABNORMAL LOW (ref 12.0–46.0)
Lymphs Abs: 0.9 10*3/uL (ref 0.7–4.0)
MCHC: 34.6 g/dL (ref 30.0–36.0)
MCV: 86 fl (ref 78.0–100.0)
Monocytes Absolute: 0.5 10*3/uL (ref 0.1–1.0)
Monocytes Relative: 6.3 % (ref 3.0–12.0)
Neutro Abs: 6.8 10*3/uL (ref 1.4–7.7)
Neutrophils Relative %: 79.9 % — ABNORMAL HIGH (ref 43.0–77.0)
Platelets: 231 10*3/uL (ref 150.0–400.0)
RBC: 4.29 Mil/uL (ref 4.22–5.81)
RDW: 15.6 % — ABNORMAL HIGH (ref 11.5–15.5)
WBC: 8.4 10*3/uL (ref 4.0–10.5)

## 2019-06-22 ENCOUNTER — Encounter: Payer: Self-pay | Admitting: Internal Medicine

## 2019-06-22 ENCOUNTER — Telehealth (INDEPENDENT_AMBULATORY_CARE_PROVIDER_SITE_OTHER): Payer: Medicare Other | Admitting: Internal Medicine

## 2019-06-22 VITALS — BP 99/64 | Temp 97.6°F | Ht 70.0 in | Wt 213.2 lb

## 2019-06-22 DIAGNOSIS — R05 Cough: Secondary | ICD-10-CM

## 2019-06-22 DIAGNOSIS — E785 Hyperlipidemia, unspecified: Secondary | ICD-10-CM | POA: Diagnosis not present

## 2019-06-22 DIAGNOSIS — R059 Cough, unspecified: Secondary | ICD-10-CM

## 2019-06-22 DIAGNOSIS — E1169 Type 2 diabetes mellitus with other specified complication: Secondary | ICD-10-CM

## 2019-06-22 DIAGNOSIS — E119 Type 2 diabetes mellitus without complications: Secondary | ICD-10-CM

## 2019-06-22 MED ORDER — BENZONATATE 200 MG PO CAPS: 200.0000 mg | ORAL_CAPSULE | Freq: Three times a day (TID) | ORAL | 0 refills | Status: DC | PRN

## 2019-06-22 NOTE — Assessment & Plan Note (Signed)
Recurrent , mild present for several weeks despite resolution of other symptoms associated with recent infection by  COVID 19. May  be pollen related but had a RUL infiltrate so we will repeat chest x ray  refill the , tessalon perles.   Advised to use saline irrigation with Milta Deiters meds  after outside activity   Quit smoking 5 or more years ago

## 2019-06-22 NOTE — Progress Notes (Signed)
Virtual visit converted to Telephone  Note  This visit type was conducted due to national recommendations for restrictions regarding the COVID-19 pandemic (e.g. social distancing).  This format is felt to be most appropriate for this patient at this time.  All issues noted in this document were discussed and addressed.  No physical exam was performed (except for noted visual exam findings with Video Visits).   I attempted connection  with@ on 06/22/19 at  2:30 PM EDT by a video enabled telemedicine application and verified that I am speaking with the correct person using two identifiers.  Interactive audio and video telecommunications were attempted between this provider and patient, however failed, due to patient having technical difficulties OR patient did not have access to video capability.  We continued and completed visit with audio only.   Location patient: home Location provider: work or home office Persons participating in the virtual visit: patient, provider  I discussed the limitations, risks, security and privacy concerns of performing an evaluation and management service by telephone and the availability of in person appointments. I also discussed with the patient that there may be a patient responsible charge related to this service. The patient expressed understanding and agreed to proceed.  Reason for visit: persistent post COVID 19 INFECTION  COUGH   HPI:  74 yr old diagnosed with post COVID 19 infection in mid march.  Treated with the ab infusion,   Followed by azithromycin  250 mg x 4 days for RUL pneumonia,   followed by prednisone for persistent cough.  He feels back to normal but continues to have a cough that is mildly productive of yellow phlegm,  Scant amounts  ,  That improved with suppressive tablets but he has run out  Denies wheezing,  Dyspnea and chest pain,  Denies fevers and body aches .  Has been sitting outside daily,  No allergy symptoms,  But not wearing a mask  when outside at home.      ROS: See pertinent positives and negatives per HPI.  Past Medical History:  Diagnosis Date  . Barrett's esophagus 2004   last EGD 2007, no metaplasia, due in 2009  . Diabetes mellitus   . Fatty liver disease, nonalcoholic   . GERD (gastroesophageal reflux disease)   . Hyperplastic colonic polyp 2004  . Lumbar disc herniation 2007   L5  . Transaminitis     Past Surgical History:  Procedure Laterality Date  . SMALL INTESTINE SURGERY     carcinoid tumore, diagnosis disputed  . TONSILLECTOMY  age 64    Family History  Problem Relation Age of Onset  . Hypertension Mother     SOCIAL HX:  reports that he quit smoking about 33 years ago. His smoking use included cigarettes. He has never used smokeless tobacco. He reports current alcohol use. He reports that he does not use drugs.   Current Outpatient Medications:  .  atorvastatin (LIPITOR) 20 MG tablet, TAKE 1 TABLET BY MOUTH EVERY DAY, Disp: 90 tablet, Rfl: 1 .  benzonatate (TESSALON) 200 MG capsule, Take 1 capsule (200 mg total) by mouth 3 (three) times daily as needed for cough., Disp: 90 capsule, Rfl: 0 .  Garlic 123XX123 MG CAPS, Take by mouth., Disp: , Rfl:  .  glucose blood test strip, Use to check blood sugars once daily, Disp: 100 each, Rfl: 12 .  JANUVIA 50 MG tablet, TAKE 1 TABLET BY MOUTH EVERY DAY, Disp: 90 tablet, Rfl: 3 .  Lactobacillus (PROBIOTIC ACIDOPHILUS  PO), Take 1 capsule by mouth daily., Disp: , Rfl:  .  Lancets (ONETOUCH ULTRASOFT) lancets, Use as instructed, Disp: 100 each, Rfl: 12 .  losartan (COZAAR) 50 MG tablet, TAKE 1 TABLET BY MOUTH EVERY DAY, Disp: 90 tablet, Rfl: 1 .  metFORMIN (GLUCOPHAGE) 500 MG tablet, Take 1 tablet (500 mg total) by mouth 2 (two) times daily with a meal., Disp: 180 tablet, Rfl: 3 .  Multiple Vitamins-Minerals (MULTIVITAMIN WITH MINERALS) tablet, Take 1 tablet by mouth daily., Disp: , Rfl:  .  Multiple Vitamins-Minerals (PRESERVISION AREDS 2 PO), Take 2  tablets by mouth daily., Disp: , Rfl:  .  nizatidine (AXID) 300 MG capsule, TAKE 1 CAPSULE BY MOUTH AT BEDTIME., Disp: 90 capsule, Rfl: 1 .  Omega-3 Fatty Acids (FISH OIL) 1000 MG CAPS, Take 1,000 mg by mouth daily., Disp: , Rfl:  .  omeprazole (PRILOSEC) 20 MG capsule, TAKE 1 CAPSULE BY MOUTH TWICE A DAY, Disp: 180 capsule, Rfl: 1 .  predniSONE (DELTASONE) 10 MG tablet, 6 tablets on Day 1 , then reduce by 1 tablet daily until gone, Disp: 21 tablet, Rfl: 0 .  Turmeric 400 MG CAPS, Take 1 capsule by mouth daily., Disp: , Rfl:   EXAM:   General impression: alert, cooperative and articulate.  No signs of being in distress  Lungs: speech is fluent sentence length suggests that patient is not short of breath and not punctuated by cough, sneezing or sniffing. Marland Kitchen   Psych: affect normal.  speech is articulate and non pressured .  Denies suicidal thoughts   ASSESSMENT AND PLAN:  Discussed the following assessment and plan:  Cough - Plan: DG Chest 2 View  Hyperlipidemia associated with type 2 diabetes mellitus ([place]) - Plan: Lipid panel  Diabetes mellitus without complication ([place]) - Plan: Hemoglobin A1c, Comprehensive metabolic panel, Microalbumin / creatinine urine ratio  Cough in adult  Cough in adult Recurrent , mild present for several weeks despite resolution of other symptoms associated with recent infection by  COVID 19. May  be pollen related but had a RUL infiltrate so we will repeat chest x ray  refill the , tessalon perles.   Advised to use saline irrigation with Milta Deiters meds  after outside activity   Quit smoking 5 or more years ago     I discussed the assessment and treatment plan with the patient. The patient was provided an opportunity to ask questions and all were answered. The patient agreed with the plan and demonstrated an understanding of the instructions.   The patient was advised to call back or seek an in-person evaluation if the symptoms worsen or if the condition fails to  improve as anticipated.   I provided  25 minutes of non-face-to-face time during this encounter reviewing patient's current problems and past procedures/imaging studies, providing counseling on the above mentioned problems , and coordination  of care . Crecencio Mc, MD

## 2019-06-24 ENCOUNTER — Other Ambulatory Visit: Payer: Medicare Other

## 2019-06-24 ENCOUNTER — Other Ambulatory Visit: Payer: Self-pay

## 2019-06-24 ENCOUNTER — Ambulatory Visit (INDEPENDENT_AMBULATORY_CARE_PROVIDER_SITE_OTHER): Payer: Medicare Other

## 2019-06-24 DIAGNOSIS — R05 Cough: Secondary | ICD-10-CM | POA: Diagnosis not present

## 2019-06-24 DIAGNOSIS — R059 Cough, unspecified: Secondary | ICD-10-CM

## 2019-08-21 ENCOUNTER — Other Ambulatory Visit: Payer: Self-pay

## 2019-08-21 ENCOUNTER — Other Ambulatory Visit (INDEPENDENT_AMBULATORY_CARE_PROVIDER_SITE_OTHER): Payer: Medicare Other

## 2019-08-21 DIAGNOSIS — E1169 Type 2 diabetes mellitus with other specified complication: Secondary | ICD-10-CM

## 2019-08-21 DIAGNOSIS — E785 Hyperlipidemia, unspecified: Secondary | ICD-10-CM | POA: Diagnosis not present

## 2019-08-21 DIAGNOSIS — E119 Type 2 diabetes mellitus without complications: Secondary | ICD-10-CM | POA: Diagnosis not present

## 2019-08-21 LAB — COMPREHENSIVE METABOLIC PANEL
ALT: 16 U/L (ref 0–53)
AST: 16 U/L (ref 0–37)
Albumin: 4.4 g/dL (ref 3.5–5.2)
Alkaline Phosphatase: 52 U/L (ref 39–117)
BUN: 14 mg/dL (ref 6–23)
CO2: 30 mEq/L (ref 19–32)
Calcium: 9.4 mg/dL (ref 8.4–10.5)
Chloride: 99 mEq/L (ref 96–112)
Creatinine, Ser: 1.13 mg/dL (ref 0.40–1.50)
GFR: 63.33 mL/min (ref >60.00)
Glucose, Bld: 135 mg/dL — ABNORMAL HIGH (ref 70–99)
Potassium: 4.6 mEq/L (ref 3.5–5.1)
Sodium: 135 mEq/L (ref 135–145)
Total Bilirubin: 1.1 mg/dL (ref 0.2–1.2)
Total Protein: 6.9 g/dL (ref 6.0–8.3)

## 2019-08-21 LAB — LIPID PANEL
Cholesterol: 89 mg/dL (ref 0–200)
HDL: 29.8 mg/dL — ABNORMAL LOW (ref >39.00)
LDL Cholesterol: 30 mg/dL (ref 0–99)
NonHDL: 59
Total CHOL/HDL Ratio: 3
Triglycerides: 147 mg/dL (ref 0.0–149.0)
VLDL: 29.4 mg/dL (ref 0.0–40.0)

## 2019-08-21 LAB — HEMOGLOBIN A1C: Hgb A1c MFr Bld: 6.1 % (ref 4.6–6.5)

## 2019-08-21 LAB — MICROALBUMIN / CREATININE URINE RATIO
Creatinine,U: 150.1 mg/dL
Microalb Creat Ratio: 0.5 mg/g (ref 0.0–30.0)
Microalb, Ur: <0.7 mg/dL (ref 0.0–1.9)

## 2019-08-27 ENCOUNTER — Ambulatory Visit: Payer: Medicare Other | Admitting: Internal Medicine

## 2019-08-28 ENCOUNTER — Other Ambulatory Visit: Payer: Self-pay | Admitting: Internal Medicine

## 2019-08-28 DIAGNOSIS — I10 Essential (primary) hypertension: Secondary | ICD-10-CM

## 2019-09-08 ENCOUNTER — Ambulatory Visit (INDEPENDENT_AMBULATORY_CARE_PROVIDER_SITE_OTHER): Payer: Medicare Other | Admitting: Internal Medicine

## 2019-09-08 ENCOUNTER — Encounter: Payer: Self-pay | Admitting: Internal Medicine

## 2019-09-08 ENCOUNTER — Other Ambulatory Visit: Payer: Self-pay

## 2019-09-08 VITALS — BP 120/70 | HR 78 | Temp 97.9°F | Resp 16 | Ht 70.0 in | Wt 221.0 lb

## 2019-09-08 DIAGNOSIS — E669 Obesity, unspecified: Secondary | ICD-10-CM

## 2019-09-08 DIAGNOSIS — E1169 Type 2 diabetes mellitus with other specified complication: Secondary | ICD-10-CM

## 2019-09-08 DIAGNOSIS — D369 Benign neoplasm, unspecified site: Secondary | ICD-10-CM

## 2019-09-08 DIAGNOSIS — K760 Fatty (change of) liver, not elsewhere classified: Secondary | ICD-10-CM

## 2019-09-08 DIAGNOSIS — E1159 Type 2 diabetes mellitus with other circulatory complications: Secondary | ICD-10-CM | POA: Diagnosis not present

## 2019-09-08 DIAGNOSIS — I10 Essential (primary) hypertension: Secondary | ICD-10-CM | POA: Diagnosis not present

## 2019-09-08 DIAGNOSIS — E785 Hyperlipidemia, unspecified: Secondary | ICD-10-CM

## 2019-09-08 DIAGNOSIS — E119 Type 2 diabetes mellitus without complications: Secondary | ICD-10-CM

## 2019-09-08 NOTE — Patient Instructions (Addendum)
°  I am making Referral to [facility] for 1) your overdue colonoscopy and 2)  ongoing management of fatty liver   Your diabetes remains under excellent control  And your cholesterol and other labs are also normal. Please continue your current medications. return in 6 months for follow up on diabetes and make sure you are seeing your eye doctor at least once a year.

## 2019-09-08 NOTE — Assessment & Plan Note (Signed)
With cirrhosis by prior evaluation at [facility].  [place], MRI done in 2019 at [facility] did not note cirrhosis or splenomegaly.   Annual AFP tumor marker and liver doppler ultrasound as been done in January. Referral to [facility]

## 2019-09-08 NOTE — Assessment & Plan Note (Signed)
Well controlled on current regimen. Renal function stable, no changes today.  Lab Results  Component Value Date   CREATININE 1.13 08/21/2019   Lab Results  Component Value Date   NA 135 08/21/2019   K 4.6 08/21/2019   CL 99 08/21/2019   CO2 30 08/21/2019

## 2019-09-08 NOTE — Assessment & Plan Note (Signed)
Appears to have reasonable  control on current regimen of metformin and Januvia .  No changes today Lab Results  Component Value Date   HGBA1C 6.1 08/21/2019   Lab Results  Component Value Date   MICROALBUR <0.7 08/21/2019   MICROALBUR <0.7 03/13/2019

## 2019-09-08 NOTE — Progress Notes (Signed)
Subjective:  Patient ID: Chad Gill, male    DOB: 1946/02/09  Age: 74 y.o. MRN: [id number]  CC: The primary encounter diagnosis was Diabetes mellitus without complication ([place]). Diagnoses of Hyperlipidemia associated with type 2 diabetes mellitus ([place]), Adenomatous polyps, NAFLD (nonalcoholic fatty liver disease), Essential hypertension, and Obesity, diabetes, and hypertension syndrome ([place]) were also pertinent to this visit.  HPI Chad Gill presents for follow up on multiple issues, including type 2 DM , hyperlipidemia and cirrhosis secondary to NASH.   He feels generally well, is exercising several times per week and checking blood sugars once daily at variable times.  BS have been under 130 fasting and < 150 post prandially.  Denies any recent hypoglyemic events.  Taking his medications as directed. Following a carbohydrate modified diet 6 days per week. Denies numbness, burning and tingling of extremities. Appetite is good.    Hypertension: patient checks blood pressure twice weekly at home.  Readings have been for the most part < 140/80 at rest . Patient is following a reduced salt diet most days and is taking medications as prescribed   Outpatient Medications Prior to Visit  Medication Sig Dispense Refill  . atorvastatin (LIPITOR) 20 MG tablet TAKE 1 TABLET BY MOUTH EVERY DAY 90 tablet 1  . benzonatate (TESSALON) 200 MG capsule TAKE 1 CAPSULE BY MOUTH EVERY DAY 3 TIMES A DAY AS NEEDED FOR COUGH 90 capsule 0  . Garlic 3419 MG CAPS Take by mouth.    Marland Kitchen glucose blood test strip Use to check blood sugars once daily 100 each 12  . JANUVIA 50 MG tablet TAKE 1 TABLET BY MOUTH EVERY DAY 90 tablet 3  . Lactobacillus (PROBIOTIC ACIDOPHILUS PO) Take 1 capsule by mouth daily.    . Lancets (ONETOUCH ULTRASOFT) lancets Use as instructed 100 each 12  . losartan (COZAAR) 50 MG tablet TAKE 1 TABLET BY MOUTH EVERY DAY 90 tablet 1  . metFORMIN (GLUCOPHAGE) 500 MG tablet Take 1 tablet (500 mg  total) by mouth 2 (two) times daily with a meal. 180 tablet 3  . Multiple Vitamins-Minerals (MULTIVITAMIN WITH MINERALS) tablet Take 1 tablet by mouth daily.    . Multiple Vitamins-Minerals (PRESERVISION AREDS 2 PO) Take 2 tablets by mouth daily.    . nizatidine (AXID) 300 MG capsule TAKE 1 CAPSULE BY MOUTH AT BEDTIME. 90 capsule 1  . Omega-3 Fatty Acids (FISH OIL) 1000 MG CAPS Take 1,000 mg by mouth daily.    Marland Kitchen omeprazole (PRILOSEC) 20 MG capsule TAKE 1 CAPSULE BY MOUTH TWICE A DAY 180 capsule 1  . Turmeric 400 MG CAPS Take 1 capsule by mouth daily.    . predniSONE (DELTASONE) 10 MG tablet 6 tablets on Day 1 , then reduce by 1 tablet daily until gone (Patient not taking: Reported on 09/08/2019) 21 tablet 0   No facility-administered medications prior to visit.    Review of Systems;  Patient denies headache, fevers, malaise, unintentional weight loss, skin rash, eye pain, sinus congestion and sinus pain, sore throat, dysphagia,  hemoptysis , cough, dyspnea, wheezing, chest pain, palpitations, orthopnea, edema, abdominal pain, nausea, melena, diarrhea, constipation, flank pain, dysuria, hematuria, urinary  Frequency, nocturia, numbness, tingling, seizures,  Focal weakness, Loss of consciousness,  Tremor, insomnia, depression, anxiety, and suicidal ideation.      Objective:  BP 120/70 (BP Location: Left Arm, Patient Position: Sitting, Cuff Size: Normal)   Pulse 78   Temp 97.9 F (36.6 C) (Oral)   Resp 16  Ht 5\' 10"  (1.778 m)   Wt 221 lb (100.2 kg)   SpO2 96%   BMI 31.71 kg/m   BP Readings from Last 3 Encounters:  09/08/19 120/70  06/22/19 99/64  05/28/19 (!) 147/82    Wt Readings from Last 3 Encounters:  09/08/19 221 lb (100.2 kg)  06/22/19 213 lb 3.2 oz (96.7 kg)  05/28/19 210 lb (95.3 kg)    General appearance: alert, cooperative and appears stated age Ears: normal TM's and external ear canals both ears Throat: lips, mucosa, and tongue normal; teeth and gums normal Neck:  no adenopathy, no carotid bruit, supple, symmetrical, trachea midline and thyroid not enlarged, symmetric, no tenderness/mass/nodules Back: symmetric, no curvature. ROM normal. No CVA tenderness. Lungs: clear to auscultation bilaterally Heart: regular rate and rhythm, S1, S2 normal, no murmur, click, rub or gallop Abdomen: soft, non-tender; bowel sounds normal; no masses,  no organomegaly Pulses: 2+ and symmetric Skin: Skin color, texture, turgor normal. No rashes or lesions Lymph nodes: Cervical, supraclavicular, and axillary nodes normal.  Lab Results  Component Value Date   HGBA1C 6.1 08/21/2019   HGBA1C 6.4 03/13/2019   HGBA1C 7.5 (H) 09/05/2018    Lab Results  Component Value Date   CREATININE 1.13 08/21/2019   CREATININE 1.05 03/13/2019   CREATININE 1.11 09/05/2018    Lab Results  Component Value Date   WBC 8.4 06/11/2019   HGB 12.8 (L) 06/11/2019   HCT 36.9 (L) 06/11/2019   PLT 231.0 06/11/2019   GLUCOSE 135 (H) 08/21/2019   CHOL 89 08/21/2019   TRIG 147.0 08/21/2019   HDL 29.80 (L) 08/21/2019   LDLDIRECT 59.0 03/26/2016   LDLCALC 30 08/21/2019   ALT 16 08/21/2019   AST 16 08/21/2019   NA 135 08/21/2019   K 4.6 08/21/2019   CL 99 08/21/2019   CREATININE 1.13 08/21/2019   BUN 14 08/21/2019   CO2 30 08/21/2019   PSA 1.35 03/13/2019   HGBA1C 6.1 08/21/2019   MICROALBUR <0.7 08/21/2019    No results found.  Assessment & Plan:   Problem List Items Addressed This Visit      Unprioritized   Adenomatous polyps    He is 2 years overdue for 3 yr follow up colonoscopy . Last one was at [facility], but he prefers to remain in [place].  Referral to [facility] made/       Relevant Orders   Ambulatory referral to General Surgery   Hyperlipidemia associated with type 2 diabetes mellitus ([place])   Relevant Orders   Lipid panel   Hypertension    Well controlled on current regimen. Renal function stable, no changes today.  Lab Results  Component Value Date    CREATININE 1.13 08/21/2019   Lab Results  Component Value Date   NA 135 08/21/2019   K 4.6 08/21/2019   CL 99 08/21/2019   CO2 30 08/21/2019         NAFLD (nonalcoholic fatty liver disease)    With cirrhosis by prior evaluation at [facility].  [place], MRI done in 2019 at [facility] did not note cirrhosis or splenomegaly.   Annual AFP tumor marker and liver doppler ultrasound as been done in January. Referral to [facility]        Relevant Orders   Ambulatory referral to Gastroenterology   Obesity, diabetes, and hypertension syndrome ([place]) - Primary    Appears to have reasonable  control on current regimen of metformin and Januvia .  No changes today Lab Results  Component  Value Date   HGBA1C 6.1 08/21/2019   Lab Results  Component Value Date   MICROALBUR <0.7 08/21/2019   MICROALBUR <0.7 03/13/2019              I provided  30 minutes of  face-to-face time during this encounter reviewing patient's current problems and past surgeries, labs and imaging studies, providing counseling on the above mentioned problems , and coordination  of care .  I have discontinued Perlie Scheuring. Staffieri "Ron"'s predniSONE. I am also having him maintain his Garlic, multivitamin with minerals, Fish Oil, onetouch ultrasoft, glucose blood, nizatidine, Januvia, metFORMIN, Multiple Vitamins-Minerals (PRESERVISION AREDS 2 PO), Turmeric, Lactobacillus (PROBIOTIC ACIDOPHILUS PO), atorvastatin, omeprazole, benzonatate, and losartan.  No orders of the defined types were placed in this encounter.   Medications Discontinued During This Encounter  Medication Reason  . predniSONE (DELTASONE) 10 MG tablet Completed Course    Follow-up: Return in about 6 months (around 03/10/2020) for follow up diabetes.   Crecencio Mc, MD

## 2019-09-08 NOTE — Assessment & Plan Note (Signed)
He is 2 years overdue for 3 yr follow up colonoscopy . Last one was at [facility], but he prefers to remain in [place].  Referral to [facility]

## 2019-09-29 ENCOUNTER — Other Ambulatory Visit: Payer: Self-pay | Admitting: General Surgery

## 2019-09-29 DIAGNOSIS — D126 Benign neoplasm of colon, unspecified: Secondary | ICD-10-CM | POA: Diagnosis not present

## 2019-09-30 ENCOUNTER — Ambulatory Visit
Admission: EM | Admit: 2019-09-30 | Discharge: 2019-09-30 | Disposition: A | Discharge status: Home / Self Care | Payer: Medicare Other | Attending: Emergency Medicine | Admitting: Emergency Medicine

## 2019-09-30 ENCOUNTER — Other Ambulatory Visit: Payer: Self-pay

## 2019-09-30 DIAGNOSIS — J029 Acute pharyngitis, unspecified: Secondary | ICD-10-CM | POA: Insufficient documentation

## 2019-09-30 MED ORDER — AMOXICILLIN 875 MG PO TABS: 875.0000 mg | ORAL_TABLET | Freq: Two times a day (BID) | ORAL | 0 refills | Status: AC

## 2019-09-30 NOTE — ED Triage Notes (Signed)
Patient complains of sore throat x5 days. Reports it hurts worse when swallowing or eating.

## 2019-09-30 NOTE — ED Provider Notes (Signed)
Chad Gill    CSN: [id number] Arrival date & time: 09/30/19  1242      History   Chief Complaint Chief Complaint  Patient presents with  . Sore Throat    HPI Chad Gill is a 74 y.o. male.   Patient presents with sore throat x5 days.  He states is worse with coughing and swallowing.  He denies fever, chills, shortness of breath, abdominal pain, vomiting, diarrhea, rash, or other symptoms.  Treatment attempted at home with NyQuil.  Medical history includes diabetes and hypertension.  The history is provided by the patient.    Past Medical History:  Diagnosis Date  . Barrett's esophagus 2004   last EGD 2007, no metaplasia, due in 2009  . Diabetes mellitus   . Fatty liver disease, nonalcoholic   . GERD (gastroesophageal reflux disease)   . Hyperplastic colonic polyp 2004  . Lumbar disc herniation 2007   L5  . Transaminitis     Patient Active Problem List   Diagnosis Date Noted  . Advice given about COVID-19 virus by telephone 03/09/2019  . Hyperlipidemia associated with type 2 diabetes mellitus ([place]) 04/16/2015  . NAFLD (nonalcoholic fatty liver disease) 01/17/2015  . Medicare annual wellness visit, subsequent 10/12/2014  . Uses hearing aid 09/10/2013  . Tinnitus of both ears 06/03/2013  . Obesity, diabetes, and hypertension syndrome ([employer]) 08/26/2012  . Hypertension 01/30/2012  . Obesity (BMI 30-39.9) 07/24/2011  . GERD (gastroesophageal reflux disease)   . Adenomatous polyps     Past Surgical History:  Procedure Laterality Date  . SMALL INTESTINE SURGERY     carcinoid tumore, diagnosis disputed  . TONSILLECTOMY  age 36       Home Medications    Prior to Admission medications   Medication Sig Start Date End Date Taking? Authorizing Provider  amoxicillin (AMOXIL) 875 MG tablet Take 1 tablet (875 mg total) by mouth 2 (two) times daily for 7 days. 09/30/19 10/07/19  Sharion Balloon, NP  atorvastatin (LIPITOR) 20 MG tablet TAKE 1 TABLET BY MOUTH  EVERY DAY 04/10/19   Crecencio Mc, MD  benzonatate (TESSALON) 200 MG capsule TAKE 1 CAPSULE BY MOUTH EVERY DAY 3 TIMES A DAY AS NEEDED FOR COUGH 08/28/19   Crecencio Mc, MD  Garlic 9622 MG CAPS Take by mouth.    [provider]  glucose blood test strip Use to check blood sugars once daily 06/04/17   Crecencio Mc, MD  JANUVIA 50 MG tablet TAKE 1 TABLET BY MOUTH EVERY DAY 12/01/18   Crecencio Mc, MD  Lactobacillus (PROBIOTIC ACIDOPHILUS PO) Take 1 capsule by mouth daily.    [provider]  Lancets [facility] ULTRASOFT) lancets Use as instructed 05/07/17   Crecencio Mc, MD  losartan (COZAAR) 50 MG tablet TAKE 1 TABLET BY MOUTH EVERY DAY 08/28/19   Crecencio Mc, MD  metFORMIN (GLUCOPHAGE) 500 MG tablet Take 1 tablet (500 mg total) by mouth 2 (two) times daily with a meal. 12/08/18   Crecencio Mc, MD  Multiple Vitamins-Minerals (MULTIVITAMIN WITH MINERALS) tablet Take 1 tablet by mouth daily.    [provider]  Multiple Vitamins-Minerals (PRESERVISION AREDS 2 PO) Take 2 tablets by mouth daily.    [provider]  nizatidine (AXID) 300 MG capsule TAKE 1 CAPSULE BY MOUTH AT BEDTIME. 06/16/18   Crecencio Mc, MD  Omega-3 Fatty Acids (FISH OIL) 1000 MG CAPS Take 1,000 mg by mouth daily.    [provider]  omeprazole (PRILOSEC) 20 MG capsule TAKE 1 CAPSULE BY MOUTH TWICE A DAY 06/02/19   Crecencio Mc, MD  Turmeric 400 MG CAPS Take 1 capsule by mouth daily.    [provider]    Family History Family History  Problem Relation Age of Onset  . Hypertension Mother     Social History Social History   Tobacco Use  . Smoking status: Former Smoker    Types: Cigarettes    Quit date: 01/21/1986    Years since quitting: 33.7  . Smokeless tobacco: Never Used  Vaping Use  . Vaping Use: Never used  Substance Use Topics  . Alcohol use: Yes    Comment: ocassional beer  . Drug use: No     Allergies   Other and Influenza vac split  [flu virus vaccine]   Review of Systems Review of Systems  Constitutional: Negative for chills and fever.  HENT: Positive for sore throat. Negative for ear pain.   Eyes: Negative for pain and visual disturbance.  Respiratory: Negative for cough and shortness of breath.   Cardiovascular: Negative for chest pain and palpitations.  Gastrointestinal: Negative for abdominal pain and vomiting.  Genitourinary: Negative for dysuria and hematuria.  Musculoskeletal: Negative for arthralgias and back pain.  Skin: Negative for color change and rash.  Neurological: Negative for seizures and syncope.  All other systems reviewed and are negative.    Physical Exam Triage Vital Signs ED Triage Vitals  Enc Vitals Group     BP      Pulse      Resp      Temp      Temp src      SpO2      Weight      Height      Head Circumference      Peak Flow      Pain Score      Pain Loc      Pain Edu?      Excl. in [place]?    No data found.  Updated Vital Signs BP (!) 152/80   Pulse 79   Temp 98.2 F (36.8 C)   Resp 17   SpO2 97%   Visual Acuity Right Eye Distance:   Left Eye Distance:   Bilateral Distance:    Right Eye Near:   Left Eye Near:    Bilateral Near:     Physical Exam Vitals and nursing note reviewed.  Constitutional:      General: He is not in acute distress.    Appearance: He is well-developed. He is not ill-appearing.  HENT:     Head: Normocephalic and atraumatic.     Right Ear: Tympanic membrane normal.     Left Ear: Tympanic membrane normal.     Nose: Nose normal.     Mouth/Throat:     Mouth: Mucous membranes are moist.     Pharynx: Uvula midline. Posterior oropharyngeal erythema present. No oropharyngeal exudate or uvula swelling.     Tonsils: 0 on the right. 0 on the left.  Eyes:     Conjunctiva/sclera: Conjunctivae normal.  Cardiovascular:     Rate and Rhythm: Normal rate and regular rhythm.  Pulmonary:     Effort: Pulmonary effort is normal. No respiratory  distress.     Breath sounds: Normal breath sounds.  Abdominal:     Palpations: Abdomen is soft.     Tenderness: There is no abdominal tenderness.  Musculoskeletal:     Cervical  back: Neck supple.  Skin:    General: Skin is warm and dry.     Findings: No rash.  Neurological:     General: No focal deficit present.     Mental Status: He is alert and oriented to person, place, and time.     Gait: Gait normal.  Psychiatric:        Mood and Affect: Mood normal.        Behavior: Behavior normal.      UC Treatments / Results  Labs (all labs ordered are listed, but only abnormal results are displayed) Labs Reviewed  NOVEL CORONAVIRUS, NAA  CULTURE, GROUP A STREP [facility])    EKG   Radiology No results found.  Procedures Procedures (including critical care time)  Medications Ordered in UC Medications - No data to display  Initial Impression / Assessment and Plan / UC Course  I have reviewed the triage vital signs and the nursing notes.  Pertinent labs & imaging results that were available during my care of the patient were reviewed by me and considered in my medical decision making (see chart for details).   Acute pharyngitis.  Treating with amoxicillin.  Instructed patient to follow-up with his PCP if his symptoms are not improving.  Patient agrees to plan of care.   Final Clinical Impressions(s) / UC Diagnoses   Final diagnoses:  Acute pharyngitis, unspecified etiology     Discharge Instructions     Take the amoxicillin as directed.    Follow up with your primary care provider if your symptoms are not improving.       ED Prescriptions    Medication Sig Dispense Auth. Provider   amoxicillin (AMOXIL) 875 MG tablet Take 1 tablet (875 mg total) by mouth 2 (two) times daily for 7 days. 14 tablet Sharion Balloon, NP     PDMP not reviewed this encounter.   Sharion Balloon, NP 09/30/19 1311

## 2019-09-30 NOTE — Discharge Instructions (Signed)
Take the amoxicillin as directed.  Follow up with your primary care provider if your symptoms are not improving.   ° ° °

## 2019-10-02 LAB — SARS-COV-2, NAA 2 DAY TAT

## 2019-10-02 LAB — NOVEL CORONAVIRUS, NAA: SARS-CoV-2, NAA: NOT DETECTED

## 2019-10-03 LAB — CULTURE, GROUP A STREP (THRC)

## 2019-11-02 ENCOUNTER — Other Ambulatory Visit: Payer: Medicare Other

## 2019-11-09 ENCOUNTER — Ambulatory Visit: Payer: Medicare Other | Admitting: Gastroenterology

## 2019-11-09 ENCOUNTER — Telehealth: Payer: Self-pay | Admitting: Internal Medicine

## 2019-11-09 NOTE — Telephone Encounter (Signed)
Rejection Reason - Patient went elsewhere" [facility] said on Nov 09, 2019 10:15 AM

## 2019-11-21 ENCOUNTER — Other Ambulatory Visit: Payer: Self-pay | Admitting: Internal Medicine

## 2019-12-02 ENCOUNTER — Other Ambulatory Visit: Payer: BLUE CROSS/BLUE SHIELD

## 2019-12-09 ENCOUNTER — Other Ambulatory Visit: Payer: Self-pay

## 2019-12-09 ENCOUNTER — Other Ambulatory Visit
Admission: RE | Admit: 2019-12-09 | Discharge: 2019-12-09 | Disposition: A | Discharge status: Home / Self Care | Payer: Medicare Other | Source: Ambulatory Visit | Attending: General Surgery | Admitting: General Surgery

## 2019-12-09 DIAGNOSIS — Z20822 Contact with and (suspected) exposure to covid-19: Secondary | ICD-10-CM | POA: Diagnosis not present

## 2019-12-09 DIAGNOSIS — Z01818 Encounter for other preprocedural examination: Secondary | ICD-10-CM | POA: Diagnosis not present

## 2019-12-10 LAB — SARS CORONAVIRUS 2 (TAT 6-24 HRS): SARS Coronavirus 2: NEGATIVE

## 2019-12-10 NOTE — H&P (Addendum)
Subjective:     Patient ID: Chad Gill is a 74 y.o. male.  HPI  The following portions of the patient's history were reviewed and updated as appropriate.  This new patient patient is here today for: office visit. He is here to discuss having a colonoscopy. Bowels move daily, no bleeding. Last colonoscopy was 2016 at [facility]  Multiple polyps removed at that time.   Marland Kitchen He is here with his wife, Hoyle Sauer.  Review of Systems  Constitutional: Negative for chills and fever.  Respiratory: Negative for cough.   Gastrointestinal: Negative for constipation and diarrhea.        Chief Complaint  Patient presents with  . New Patient    discuss having a colonoscopy     BP 172/84   Pulse 98   Temp 36.2 C (97.1 F)   Ht 177.8 cm (5\' 10" )   Wt (!) 102.1 kg (225 lb)   SpO2 95%   BMI 32.28 kg/m       Past Medical History:  Diagnosis Date  . Colon polyp   . Fatty liver disease, nonalcoholic   . GERD (gastroesophageal reflux disease)   . Hyperlipidemia   . Obesity   . Type 2 diabetes mellitus (CMS-HCC)           Past Surgical History:  Procedure Laterality Date  . COLONOSCOPY  2012, 2018   [facility]  . Finger laceration repair Right       Social History          Socioeconomic History  . Marital status: Married    Spouse name: Not on file  . Number of children: Not on file  . Years of education: Not on file  . Highest education level: Not on file  Occupational History  . Not on file  Tobacco Use  . Smoking status: Former Smoker    Quit date: 06/30/1974    Years since quitting: 45.2  . Smokeless tobacco: Never Used  Substance and Sexual Activity  . Alcohol use: No    Alcohol/week: 0.0 standard drinks  . Drug use: No  . Sexual activity: Not on file  Other Topics Concern  . Not on file  Social History Narrative  . Not on file   Social Determinants of Health      Financial Resource Strain:   . Difficulty of Paying Living  Expenses:   Food Insecurity:   . Worried About Charity fundraiser in the Last Year:   . Arboriculturist in the Last Year:   Transportation Needs:   . Film/video editor (Medical):   Marland Kitchen Lack of Transportation (Non-Medical):             Allergies  Allergen Reactions  . Influenza Virus Vaccine Ts 2015-2016(6 Mos And Up) Hives  . Other Hives    Flu vaccine    Current Medications        Current Outpatient Medications  Medication Sig Dispense Refill  . ascorbic acid, vitamin C, (VITAMIN C) 500 MG tablet Take by mouth    . atorvastatin (LIPITOR) 20 MG tablet Take 20 mg by mouth once daily       . benzonatate (TESSALON) 200 MG capsule as needed    . cholecalciferol (VITAMIN D3) 1,000 unit capsule Take 1,000 Units by mouth once daily.    Marland Kitchen garlic 7,915 mg Cap Take by mouth daily.    . Lactobacillus acidophilus/lact (LACTOBACILLUS ACIDOPH-LACTASE ORAL) Take by mouth once daily    .  losartan (COZAAR) 50 MG tablet TAKE 1 TABLET BY MOUTH EVERY DAY    . metFORMIN (GLUCOPHAGE) 500 MG tablet Take 500 mg by mouth 2 (two) times daily with meals       . multivitamin tablet Take 1 tablet by mouth once daily. Centrum Silver    . nizatidine (AXID) 300 MG capsule Take 1 tablet by mouth nightly.    Marland Kitchen omega-3 fatty acids/fish oil 340-1,000 mg capsule Take 1 capsule by mouth once daily.    Marland Kitchen omeprazole (PRILOSEC) 20 MG DR capsule Take 1 tablet by mouth Every morning.    Marland Kitchen SITagliptin (JANUVIA) 50 MG tablet 1 tablet once daily    . turmeric 400 mg Cap Take by mouth once daily        No current facility-administered medications for this visit.           Family History  Problem Relation Age of Onset  . Liver disease Neg Hx   . Colon cancer Neg Hx             Objective:   Physical Exam Constitutional:      Appearance: Normal appearance.  Cardiovascular:     Rate and Rhythm: Normal rate and regular rhythm.     Heart sounds: Normal heart  sounds.  Pulmonary:     Effort: Pulmonary effort is normal.     Breath sounds: Normal breath sounds.  Skin:    General: Skin is warm and dry.  Neurological:     Mental Status: He is alert and oriented to person, place, and time.  Psychiatric:        Mood and Affect: Mood normal.        Behavior: Behavior normal.    Labs and Radiology:   [facility] pathology from July 26, 2014 Final Diagnosis    A: Colon, cecum, ascending, and hepatic flexure, biopsy  - Adenomatous polyp (multiple fragments); no definitive high-grade dysplasia identified - Hyperplastic polyp (1 fragment)  B: Colon, transverse and sigmoid, biopsy - Adenomatous polyp (multiple fragments); no high-grade dysplasia identified - Sessile serrated polyp (1 fragment); no cytologic dysplasia identified  C: Colon, rectosigmoid, biopsy  - Adenomatous polyp (multiple fragments); no high-grade dysplasia identified - Prolapse-type polyp (1 fragment)      Assessment:     Candidate for follow-up colonoscopy.    Plan:     The patient had 4 adenomatous or sessile serrated polyps at the time of his last colonoscopy.  Repeat exam is appropriate at this time.  Risks related to colonoscopy were reviewed.  He was instructed in regards to preparation by the staff.    Patient to check his schedule and call the office back to arrange.   Entered by Karie Fetch, RN, acting as a scribe for Dr. Hervey Ard, MD.  The documentation recorded by the scribe accurately reflects the service I personally performed and the decisions made by me.   Robert Bellow, MD FACS      Electronically signed by Mayer Masker, MD on 09/29/2019 10:25 PM

## 2019-12-11 ENCOUNTER — Ambulatory Visit
Admission: RE | Admit: 2019-12-11 | Discharge: 2019-12-11 | Disposition: A | Discharge status: Home / Self Care | Payer: Medicare Other | Attending: General Surgery | Admitting: General Surgery

## 2019-12-11 ENCOUNTER — Other Ambulatory Visit: Payer: Self-pay

## 2019-12-11 ENCOUNTER — Ambulatory Visit: Payer: Medicare Other | Admitting: Anesthesiology

## 2019-12-11 ENCOUNTER — Encounter: Payer: Self-pay | Admitting: General Surgery

## 2019-12-11 ENCOUNTER — Encounter
Admission: RE | Disposition: A | Discharge status: Home / Self Care | Payer: Self-pay | Source: Home / Self Care | Attending: General Surgery

## 2019-12-11 DIAGNOSIS — D124 Benign neoplasm of descending colon: Secondary | ICD-10-CM | POA: Insufficient documentation

## 2019-12-11 DIAGNOSIS — Z79899 Other long term (current) drug therapy: Secondary | ICD-10-CM | POA: Insufficient documentation

## 2019-12-11 DIAGNOSIS — K219 Gastro-esophageal reflux disease without esophagitis: Secondary | ICD-10-CM | POA: Insufficient documentation

## 2019-12-11 DIAGNOSIS — R7401 Elevation of levels of liver transaminase levels: Secondary | ICD-10-CM | POA: Insufficient documentation

## 2019-12-11 DIAGNOSIS — E785 Hyperlipidemia, unspecified: Secondary | ICD-10-CM | POA: Diagnosis not present

## 2019-12-11 DIAGNOSIS — K5790 Diverticulosis of intestine, part unspecified, without perforation or abscess without bleeding: Secondary | ICD-10-CM | POA: Diagnosis not present

## 2019-12-11 DIAGNOSIS — Z6831 Body mass index (BMI) 31.0-31.9, adult: Secondary | ICD-10-CM | POA: Insufficient documentation

## 2019-12-11 DIAGNOSIS — E669 Obesity, unspecified: Secondary | ICD-10-CM | POA: Diagnosis not present

## 2019-12-11 DIAGNOSIS — E119 Type 2 diabetes mellitus without complications: Secondary | ICD-10-CM | POA: Insufficient documentation

## 2019-12-11 DIAGNOSIS — K5730 Diverticulosis of large intestine without perforation or abscess without bleeding: Secondary | ICD-10-CM | POA: Diagnosis not present

## 2019-12-11 DIAGNOSIS — Z87891 Personal history of nicotine dependence: Secondary | ICD-10-CM | POA: Diagnosis not present

## 2019-12-11 DIAGNOSIS — Z887 Allergy status to serum and vaccine status: Secondary | ICD-10-CM | POA: Insufficient documentation

## 2019-12-11 DIAGNOSIS — K760 Fatty (change of) liver, not elsewhere classified: Secondary | ICD-10-CM | POA: Diagnosis not present

## 2019-12-11 DIAGNOSIS — Z7984 Long term (current) use of oral hypoglycemic drugs: Secondary | ICD-10-CM | POA: Insufficient documentation

## 2019-12-11 DIAGNOSIS — Z86010 Personal history of colonic polyps: Secondary | ICD-10-CM | POA: Insufficient documentation

## 2019-12-11 DIAGNOSIS — Z1211 Encounter for screening for malignant neoplasm of colon: Secondary | ICD-10-CM | POA: Insufficient documentation

## 2019-12-11 DIAGNOSIS — I10 Essential (primary) hypertension: Secondary | ICD-10-CM | POA: Diagnosis not present

## 2019-12-11 HISTORY — PX: COLONOSCOPY WITH PROPOFOL: SHX5780

## 2019-12-11 LAB — GLUCOSE, CAPILLARY: Glucose-Capillary: 176 mg/dL — ABNORMAL HIGH (ref 70–99)

## 2019-12-11 SURGERY — COLONOSCOPY WITH PROPOFOL
Anesthesia: General

## 2019-12-11 MED ORDER — PROPOFOL 10 MG/ML IV BOLUS
INTRAVENOUS | Status: DC | PRN
  Administered 2019-12-11: 60 mg via INTRAVENOUS
  Administered 2019-12-11: 40 mg via INTRAVENOUS

## 2019-12-11 MED ORDER — LIDOCAINE 2% (20 MG/ML) 5 ML SYRINGE
INTRAMUSCULAR | Status: DC | PRN
  Administered 2019-12-11: 100 mg via INTRAVENOUS

## 2019-12-11 MED ORDER — SODIUM CHLORIDE 0.9 % IV SOLN: INTRAVENOUS | Status: DC

## 2019-12-11 MED ORDER — PROPOFOL 500 MG/50ML IV EMUL
INTRAVENOUS | Status: AC
  Filled 2019-12-11: qty 50

## 2019-12-11 MED ORDER — PROPOFOL 500 MG/50ML IV EMUL
INTRAVENOUS | Status: DC | PRN
  Administered 2019-12-11: 150 ug/kg/min via INTRAVENOUS

## 2019-12-11 NOTE — Op Note (Signed)
[facility] Gastroenterology Patient Name: Chad Gill Procedure Date: 12/11/2019 7:37 AM MRN: [id number] Account #: [id number] Date of Birth: [birth year] Admit Type: Outpatient Age: 74 Room: [facility] ENDO ROOM 1 Gender: Male Note Status: Finalized Procedure:             Colonoscopy Indications:           High risk colon cancer surveillance: Personal history                         of colonic polyps Providers:             Robert Bellow, MD Medicines:             Monitored Anesthesia Care Complications:         No immediate complications. Procedure:             Pre-Anesthesia Assessment:                        - Prior to the procedure, a History and Physical was                         performed, and patient medications, allergies and                         sensitivities were reviewed. The patient's tolerance                         of previous anesthesia was reviewed.                        - The risks and benefits of the procedure and the                         sedation options and risks were discussed with the                         patient. All questions were answered and informed                         consent was obtained.                        After obtaining informed consent, the colonoscope was                         passed under direct vision. Throughout the procedure,                         the patient's blood pressure, pulse, and oxygen                         saturations were monitored continuously. The                         Colonoscope was introduced through the anus and                         advanced to the the terminal ileum. The colonoscopy  was somewhat difficult due to significant looping.                         Successful completion of the procedure was aided by                         using manual pressure. The patient tolerated the                         procedure well. The quality of the bowel  preparation                         was excellent. Findings:      A few medium-mouthed diverticula were found in the descending colon.      A 8 mm polyp was found in the descending colon. The polyp was sessile.       The polyp was removed with a hot snare. Resection and retrieval were       complete.      The retroflexed view of the distal rectum and anal verge was normal and       showed no anal or rectal abnormalities. Impression:            - Diverticulosis in the descending colon.                        - One 8 mm polyp in the descending colon, removed with                         a hot snare. Resected and retrieved.                        - The distal rectum and anal verge are normal on                         retroflexion view. Recommendation:        - Telephone endoscopist for pathology results in 1                         week. Procedure Code(s):     --- Professional ---                        [phone], Colonoscopy, flexible; with removal of                         tumor(s), polyp(s), or other lesion(s) by snare                         technique Diagnosis Code(s):     --- Professional ---                        Z86.010, Personal history of colonic polyps                        K63.5, Polyp of colon                        K57.30, Diverticulosis of large intestine without  perforation or abscess without bleeding CPT copyright 2019 American [employer] Association. All rights reserved. The codes documented in this report are preliminary and upon coder review may  be revised to meet current compliance requirements. Robert Bellow, MD 12/11/2019 8:05:07 AM This report has been signed electronically. Number of Addenda: 0 Note Initiated On: 12/11/2019 7:37 AM Scope Withdrawal Time: 0 hours 11 minutes 0 seconds  Total Procedure Duration: 0 hours 20 minutes 16 seconds       [facility]

## 2019-12-11 NOTE — Transfer of Care (Signed)
Immediate Anesthesia Transfer of Care Note  Patient: Chad Gill  Procedure(s) Performed: COLONOSCOPY WITH PROPOFOL (N/A )  Patient Location: Endoscopy Unit  Anesthesia Type:General  Level of Consciousness: sedated  Airway & Oxygen Therapy: Patient connected to nasal cannula oxygen  Post-op Assessment: Post -op Vital signs reviewed and stable  Post vital signs: stable  Last Vitals:  Vitals Value Taken Time  BP    Temp    Pulse 81 12/11/19 0806  Resp 12 12/11/19 0806  SpO2 96 % 12/11/19 0806  Vitals shown include unvalidated device data.  Last Pain:  Vitals:   12/11/19 0707  TempSrc: Temporal  PainSc: 0-No pain         Complications: No complications documented.

## 2019-12-11 NOTE — H&P (Signed)
Chad Gill [id number] 10/17/1945     HPI:  Past history of multiple polyps in 2016. Tolerated prep well.   Medications Prior to Admission  Medication Sig Dispense Refill Last Dose  . Ascorbic Acid (VITAMIN C PO) Take 500 mg by mouth.   Past Week at Unknown time  . atorvastatin (LIPITOR) 20 MG tablet TAKE 1 TABLET BY MOUTH EVERY DAY 90 tablet 1 12/10/2019 at Unknown time  . Garlic 4650 MG CAPS Take by mouth.   12/10/2019 at Unknown time  . JANUVIA 50 MG tablet TAKE 1 TABLET BY MOUTH EVERY DAY 90 tablet 3 12/10/2019 at Unknown time  . Lactobacillus (PROBIOTIC ACIDOPHILUS PO) Take 1 capsule by mouth daily.   12/10/2019 at Unknown time  . losartan (COZAAR) 50 MG tablet TAKE 1 TABLET BY MOUTH EVERY DAY 90 tablet 1 12/10/2019 at Unknown time  . metFORMIN (GLUCOPHAGE) 500 MG tablet Take 1 tablet (500 mg total) by mouth 2 (two) times daily with a meal. 180 tablet 3 12/10/2019 at Unknown time  . Multiple Vitamins-Minerals (MULTIVITAMIN WITH MINERALS) tablet Take 1 tablet by mouth daily.   12/10/2019 at Unknown time  . Multiple Vitamins-Minerals (PRESERVISION AREDS 2 PO) Take 2 tablets by mouth daily.   12/10/2019 at Unknown time  . nizatidine (AXID) 300 MG capsule TAKE 1 CAPSULE BY MOUTH AT BEDTIME. 90 capsule 1 12/10/2019 at Unknown time  . Omega-3 Fatty Acids (FISH OIL) 1000 MG CAPS Take 1,000 mg by mouth daily.   12/10/2019 at Unknown time  . omeprazole (PRILOSEC) 20 MG capsule TAKE 1 CAPSULE BY MOUTH TWICE A DAY 180 capsule 1 12/10/2019 at Unknown time  . Turmeric 400 MG CAPS Take 1 capsule by mouth daily.   12/10/2019 at Unknown time  . benzonatate (TESSALON) 200 MG capsule TAKE 1 CAPSULE BY MOUTH EVERY DAY 3 TIMES A DAY AS NEEDED FOR COUGH (Patient not taking: Reported on 12/11/2019) 90 capsule 0 Not Taking at Unknown time  . glucose blood test strip Use to check blood sugars once daily 100 each 12   . Lancets (ONETOUCH ULTRASOFT) lancets Use as instructed 100 each 12    Allergies   Allergen Reactions  . Other Hives    Flu vaccine  . Influenza Vac Split [Flu Virus Vaccine] Hives   Past Medical History:  Diagnosis Date  . Barrett's esophagus 2004   last EGD 2007, no metaplasia, due in 2009  . Diabetes mellitus   . Fatty liver disease, nonalcoholic   . GERD (gastroesophageal reflux disease)   . Hyperplastic colonic polyp 2004  . Lumbar disc herniation 2007   L5  . Transaminitis    Past Surgical History:  Procedure Laterality Date  . SMALL INTESTINE SURGERY     carcinoid tumore, diagnosis disputed  . TONSILLECTOMY  age 80   Social History   Socioeconomic History  . Marital status: Married    Spouse name: Not on file  . Number of children: Not on file  . Years of education: Not on file  . Highest education level: Not on file  Occupational History  . Not on file  Tobacco Use  . Smoking status: Former Smoker    Types: Cigarettes    Quit date: 01/21/1986    Years since quitting: 33.9  . Smokeless tobacco: Never Used  Vaping Use  . Vaping Use: Never used  Substance and Sexual Activity  . Alcohol use: Yes    Comment: ocassional beer  . Drug use: No  . Sexual activity:  Yes  Other Topics Concern  . Not on file  Social History Narrative  . Not on file   Social Determinants of Health   Financial Resource Strain:   . Difficulty of Paying Living Expenses: Not on file  Food Insecurity:   . Worried About Charity fundraiser in the Last Year: Not on file  . Ran Out of Food in the Last Year: Not on file  Transportation Needs:   . Lack of Transportation (Medical): Not on file  . Lack of Transportation (Non-Medical): Not on file  Physical Activity:   . Days of Exercise per Week: Not on file  . Minutes of Exercise per Session: Not on file  Stress:   . Feeling of Stress : Not on file  Social Connections:   . Frequency of Communication with Friends and Family: Not on file  . Frequency of Social Gatherings with Friends and Family: Not on file  .  Attends Religious Services: Not on file  . Active Member of Clubs or Organizations: Not on file  . Attends Archivist Meetings: Not on file  . Marital Status: Not on file  Intimate Partner Violence:   . Fear of Current or Ex-Partner: Not on file  . Emotionally Abused: Not on file  . Physically Abused: Not on file  . Sexually Abused: Not on file   Social History   Social History Narrative  . Not on file     ROS: Negative.     PE: HEENT: Negative. Lungs: Clear. Cardio: RR.  Assessment/Plan:  Proceed with planned endoscopy.    Forest Gleason [facility] 12/11/2019

## 2019-12-11 NOTE — Anesthesia Postprocedure Evaluation (Signed)
Anesthesia Post Note  Patient: Chad Gill  Procedure(s) Performed: COLONOSCOPY WITH PROPOFOL (N/A )  Patient location during evaluation: Endoscopy Anesthesia Type: General Level of consciousness: awake and alert and oriented Pain management: pain level controlled Vital Signs Assessment: post-procedure vital signs reviewed and stable Respiratory status: spontaneous breathing, nonlabored ventilation and respiratory function stable Cardiovascular status: blood pressure returned to baseline and stable Postop Assessment: no signs of nausea or vomiting Anesthetic complications: no   No complications documented.   Last Vitals:  Vitals:   12/11/19 0820 12/11/19 0830  BP: (!) 147/82 (!) 147/82  Pulse: 77 79  Resp: 17 13  Temp:    SpO2: 98% 98%    Last Pain:  Vitals:   12/11/19 0800  TempSrc: Temporal  PainSc:                  Chad Gill

## 2019-12-11 NOTE — Anesthesia Preprocedure Evaluation (Signed)
Anesthesia Evaluation  Patient identified by MRN, date of birth, ID band Patient awake    Reviewed: Allergy & Precautions, NPO status , Patient's Chart, lab work & pertinent test results  History of Anesthesia Complications Negative for: history of anesthetic complications  Airway Mallampati: II  TM Distance: >3 FB Neck ROM: Full    Dental  (+) Edentulous Upper, Edentulous Lower   Pulmonary neg sleep apnea, neg COPD, former smoker,    breath sounds clear to auscultation- rhonchi (-) wheezing      Cardiovascular hypertension, Pt. on medications (-) CAD, (-) Past MI, (-) Cardiac Stents and (-) CABG  Rhythm:Regular Rate:Normal - Systolic murmurs and - Diastolic murmurs    Neuro/Psych neg Seizures negative neurological ROS  negative psych ROS   GI/Hepatic Neg liver ROS, GERD  ,  Endo/Other  diabetes, Oral Hypoglycemic Agents  Renal/GU negative Renal ROS     Musculoskeletal negative musculoskeletal ROS (+)   Abdominal (+) + obese,   Peds  Hematology negative hematology ROS (+)   Anesthesia Other Findings Past Medical History: 2004: Barrett's esophagus     Comment:  last EGD 2007, no metaplasia, due in 2009 No date: Diabetes mellitus No date: Fatty liver disease, nonalcoholic No date: GERD (gastroesophageal reflux disease) 2004: Hyperplastic colonic polyp 2007: Lumbar disc herniation     Comment:  L5 No date: Transaminitis   Reproductive/Obstetrics                             Anesthesia Physical Anesthesia Plan  ASA: II  Anesthesia Plan: General   Post-op Pain Management:    Induction: Intravenous  PONV Risk Score and Plan: 1 and Propofol infusion  Airway Management Planned: Natural Airway  Additional Equipment:   Intra-op Plan:   Post-operative Plan:   Informed Consent: I have reviewed the patients History and Physical, chart, labs and discussed the procedure including  the risks, benefits and alternatives for the proposed anesthesia with the patient or authorized representative who has indicated his/her understanding and acceptance.     Dental advisory given  Plan Discussed with: CRNA and Anesthesiologist  Anesthesia Plan Comments:         Anesthesia Quick Evaluation

## 2019-12-14 LAB — SURGICAL PATHOLOGY

## 2019-12-17 ENCOUNTER — Other Ambulatory Visit: Payer: Self-pay | Admitting: Internal Medicine

## 2019-12-29 ENCOUNTER — Other Ambulatory Visit: Payer: Self-pay | Admitting: Internal Medicine

## 2020-01-16 ENCOUNTER — Other Ambulatory Visit: Payer: Self-pay | Admitting: Internal Medicine

## 2020-03-09 ENCOUNTER — Other Ambulatory Visit (INDEPENDENT_AMBULATORY_CARE_PROVIDER_SITE_OTHER): Payer: Medicare Other

## 2020-03-09 ENCOUNTER — Other Ambulatory Visit: Payer: Self-pay

## 2020-03-09 DIAGNOSIS — E1169 Type 2 diabetes mellitus with other specified complication: Secondary | ICD-10-CM

## 2020-03-09 DIAGNOSIS — E785 Hyperlipidemia, unspecified: Secondary | ICD-10-CM | POA: Diagnosis not present

## 2020-03-09 DIAGNOSIS — E119 Type 2 diabetes mellitus without complications: Secondary | ICD-10-CM | POA: Diagnosis not present

## 2020-03-09 LAB — COMPREHENSIVE METABOLIC PANEL
ALT: 23 U/L (ref 0–53)
AST: 18 U/L (ref 0–37)
Albumin: 4.5 g/dL (ref 3.5–5.2)
Alkaline Phosphatase: 53 U/L (ref 39–117)
BUN: 14 mg/dL (ref 6–23)
CO2: 29 mEq/L (ref 19–32)
Calcium: 9.3 mg/dL (ref 8.4–10.5)
Chloride: 99 mEq/L (ref 96–112)
Creatinine, Ser: 1.15 mg/dL (ref 0.40–1.50)
GFR: 62.42 mL/min (ref >60.00)
Glucose, Bld: 189 mg/dL — ABNORMAL HIGH (ref 70–99)
Potassium: 4.2 mEq/L (ref 3.5–5.1)
Sodium: 136 mEq/L (ref 135–145)
Total Bilirubin: 1.2 mg/dL (ref 0.2–1.2)
Total Protein: 6.6 g/dL (ref 6.0–8.3)

## 2020-03-09 LAB — HEMOGLOBIN A1C: Hgb A1c MFr Bld: 6.9 % — ABNORMAL HIGH (ref 4.6–6.5)

## 2020-03-09 LAB — LIPID PANEL
Cholesterol: 101 mg/dL (ref 0–200)
HDL: 36 mg/dL — ABNORMAL LOW (ref >39.00)
LDL Cholesterol: 37 mg/dL (ref 0–99)
NonHDL: 65.14
Total CHOL/HDL Ratio: 3
Triglycerides: 140 mg/dL (ref 0.0–149.0)
VLDL: 28 mg/dL (ref 0.0–40.0)

## 2020-03-11 ENCOUNTER — Other Ambulatory Visit: Payer: Self-pay

## 2020-03-11 ENCOUNTER — Telehealth (INDEPENDENT_AMBULATORY_CARE_PROVIDER_SITE_OTHER): Payer: Medicare Other | Admitting: Internal Medicine

## 2020-03-11 ENCOUNTER — Encounter: Payer: Self-pay | Admitting: Internal Medicine

## 2020-03-11 DIAGNOSIS — D369 Benign neoplasm, unspecified site: Secondary | ICD-10-CM

## 2020-03-11 DIAGNOSIS — K760 Fatty (change of) liver, not elsewhere classified: Secondary | ICD-10-CM

## 2020-03-11 DIAGNOSIS — E1169 Type 2 diabetes mellitus with other specified complication: Secondary | ICD-10-CM

## 2020-03-11 DIAGNOSIS — I10 Essential (primary) hypertension: Secondary | ICD-10-CM

## 2020-03-11 DIAGNOSIS — E1159 Type 2 diabetes mellitus with other circulatory complications: Secondary | ICD-10-CM

## 2020-03-11 DIAGNOSIS — E669 Obesity, unspecified: Secondary | ICD-10-CM

## 2020-03-11 DIAGNOSIS — I152 Hypertension secondary to endocrine disorders: Secondary | ICD-10-CM

## 2020-03-11 DIAGNOSIS — E785 Hyperlipidemia, unspecified: Secondary | ICD-10-CM

## 2020-03-11 MED ORDER — SITAGLIPTIN PHOSPHATE 50 MG PO TABS: 50.0000 mg | ORAL_TABLET | Freq: Every day | ORAL | 1 refills | Status: DC

## 2020-03-11 NOTE — Assessment & Plan Note (Signed)
13  polyps on 2016 colonoscopy were removed,  Many were adenomatous via path report via Epic portal [facility] Records).  3 yr follow up was advised but deferred  Repeat done Oct 2021 [facility]).  One TA removed. Repeat 2026

## 2020-03-11 NOTE — Progress Notes (Signed)
Fasting BS Readings  144, 152, 139, 165

## 2020-03-11 NOTE — Progress Notes (Signed)
Virtual Visit converted to Telephone   This visit type was conducted due to national recommendations for restrictions regarding the COVID-19 pandemic (e.g. social distancing).  This format is felt to be most appropriate for this patient at this time.  All issues noted in this document were discussed and addressed.  No physical exam was performed (except for noted visual exam findings with Video Visits).   I connected with@ on 03/11/20 at  1:30 PM EST by telephone and verified that I am speaking with the correct person using two identifiers. Location patient: home Location provider: work or home office Persons participating in the virtual visit: patient, provider  I discussed the limitations, risks, security and privacy concerns of performing an evaluation and management service by telephone and the availability of in person appointments. I also discussed with the patient that there may be a patient responsible charge related to this service. The patient expressed understanding and agreed to proceed.  Interactive audio and video telecommunications were attempted between this provider and patient, however failed, due to patient having technical difficulties .  We continued and completed visit with audio only.   Reason for visit: follow up on type 2 DM    HPI:  75 yr old male with NASH/cirrhosis,  Type 2 DM with obesity and hypertension started on metformin for a1c of 6.9 last visit   Lab Results  Component Value Date   HGBA1C 6.9 (H) 03/09/2020    He feels generally well, is  exercising several times per week and checking blood sugars once daily in the morning .  BS have been over 130  And under 170  Fasting.  y.  Denies any recent hypoglyemic events.  His Januvia was not filled by his pharmacy for unclear reasons,  But he did not question this,  So he has been  taking only metformin  Following a carbohydrate modified diet 6 days per week. Denies numbness, burning and tingling of extremities.  Appetite is good.     NASH/cirrhosis:  He is asymptomatic ,  And follows up with GI every 6 months for serial ultrasound and AFP tumor marker   Hypertension: patient checks blood pressure twice weekly at home.  Readings have been for the most part < 130/80 at rest . Patient is following a reduced salt diet most days and is taking medications as prescribed   ROS: See pertinent positives and negatives per HPI.  Past Medical History:  Diagnosis Date  . Barrett's esophagus 2004   last EGD 2007, no metaplasia, due in 2009  . Diabetes mellitus   . Fatty liver disease, nonalcoholic   . GERD (gastroesophageal reflux disease)   . Hyperplastic colonic polyp 2004  . Lumbar disc herniation 2007   L5  . Transaminitis     Past Surgical History:  Procedure Laterality Date  . COLONOSCOPY WITH PROPOFOL N/A 12/11/2019   Procedure: COLONOSCOPY WITH PROPOFOL;  Surgeon: Earline Mayotte, MD;  Location: [facility] ENDOSCOPY;  Service: Endoscopy;  Laterality: N/A;  . SMALL INTESTINE SURGERY     carcinoid tumore, diagnosis disputed  . TONSILLECTOMY  age 75    Family History  Problem Relation Age of Onset  . Hypertension Mother     SOCIAL HX:  reports that he quit smoking about 34 years ago. His smoking use included cigarettes. He has never used smokeless tobacco. He reports current alcohol use. He reports that he does not use drugs.   Current Outpatient Medications:  .  Ascorbic Acid (VITAMIN C PO),  Take 500 mg by mouth., Disp: , Rfl:  .  atorvastatin (LIPITOR) 20 MG tablet, TAKE 1 TABLET BY MOUTH EVERY DAY, Disp: 90 tablet, Rfl: 1 .  Garlic 2423 MG CAPS, Take by mouth., Disp: , Rfl:  .  glucose blood test strip, Use to check blood sugars once daily, Disp: 100 each, Rfl: 12 .  Lancets (ONETOUCH ULTRASOFT) lancets, Use as instructed, Disp: 100 each, Rfl: 12 .  losartan (COZAAR) 50 MG tablet, TAKE 1 TABLET BY MOUTH EVERY DAY, Disp: 90 tablet, Rfl: 1 .  metFORMIN (GLUCOPHAGE) 500 MG tablet, TAKE 1  TABLET BY MOUTH TWICE A DAY WITH MEALS, Disp: 180 tablet, Rfl: 3 .  Multiple Vitamins-Minerals (MULTIVITAMIN WITH MINERALS) tablet, Take 1 tablet by mouth daily., Disp: , Rfl:  .  Multiple Vitamins-Minerals (PRESERVISION AREDS 2 PO), Take 2 tablets by mouth daily., Disp: , Rfl:  .  nizatidine (AXID) 300 MG capsule, TAKE 1 CAPSULE BY MOUTH AT BEDTIME., Disp: 90 capsule, Rfl: 1 .  Omega-3 Fatty Acids (FISH OIL) 1000 MG CAPS, Take 1,000 mg by mouth daily., Disp: , Rfl:  .  omeprazole (PRILOSEC) 20 MG capsule, TAKE 1 CAPSULE BY MOUTH TWICE A DAY, Disp: 180 capsule, Rfl: 1 .  sitaGLIPtin (JANUVIA) 50 MG tablet, Take 1 tablet (50 mg total) by mouth daily., Disp: 90 tablet, Rfl: 1 .  Turmeric 400 MG CAPS, Take 1 capsule by mouth daily., Disp: , Rfl:   EXAM:   General impression: alert, cooperative and articulate.  No signs of being in distress  Lungs: speech is fluent sentence length suggests that patient is not short of breath and not punctuated by cough, sneezing or sniffing. Marland Kitchen   Psych: affect normal.  speech is articulate and non pressured .  Denies suicidal thoughts   ASSESSMENT AND PLAN:  Discussed the following assessment and plan:  Adenomatous polyps  Hyperlipidemia associated with type 2 diabetes mellitus ([place])  Primary hypertension  NAFLD (nonalcoholic fatty liver disease)  Obesity, diabetes, and hypertension syndrome ([place])  Adenomatous polyps 13  polyps on 2016 colonoscopy were removed,  Many were adenomatous via path report via Epic portal [facility] Records).  3 yr follow up was advised but deferred  Repeat done Oct 2021 [facility]).  One TA removed. Repeat 2026    Hyperlipidemia associated with type 2 diabetes mellitus (HCC) LDL and triglycerides are at goal on atorvastatin 20 mg.  He has cirrhosis secondary to NASH, by MRI abdomen done in 2019,   But   liver enzymes have normalized .   Lab Results  Component Value Date   CHOL 101 03/09/2020   HDL 36.00 (L) 03/09/2020    LDLCALC 37 03/09/2020   LDLDIRECT 59.0 03/26/2016   TRIG 140.0 03/09/2020   CHOLHDL 3 03/09/2020   Lab Results  Component Value Date   ALT 23 03/09/2020   AST 18 03/09/2020   ALKPHOS 53 03/09/2020   BILITOT 1.2 03/09/2020     Hypertension Well controlled on current regimen of losartan 50 mg daily . Renal function stable, no changes today.  Lab Results  Component Value Date   CREATININE 1.15 03/09/2020   Lab Results  Component Value Date   NA 136 03/09/2020   K 4.2 03/09/2020   CL 99 03/09/2020   CO2 29 03/09/2020     NAFLD (nonalcoholic fatty liver disease) With cirrhosis by prior evaluation at [facility].  [place], MRI done in 2019 at [facility] did not note cirrhosis or splenomegaly.   Annual AFP tumor marker and  liver doppler ultrasound has been done by GI  Obesity, diabetes, and hypertension syndrome (HCC) Appears to have reasonable  control on current regimen of metformin but has had elevated readings since Januvia  Was erroneously suspended.  Advised to resume Januvia. Continue ARB and statin.   Lab Results  Component Value Date   HGBA1C 6.9 (H) 03/09/2020   Lab Results  Component Value Date   MICROALBUR <0.7 08/21/2019   MICROALBUR <0.7 03/13/2019      I discussed the assessment and treatment plan with the patient. The patient was provided an opportunity to ask questions and all were answered. The patient agreed with the plan and demonstrated an understanding of the instructions.   The patient was advised to call back or seek an in-person evaluation if the symptoms worsen or if the condition fails to improve as anticipated.    Video connection was lost at < 50% of the duration of the visit ,  At which time the remainder of the visit was completed via audio only.   I provided  25 minutes of non-face-to-face time during this encounter reviewing patient's current problems and post surgeries.  Providing counseling on the above mentioned problems , and coordination  of  care . Crecencio Mc, MD

## 2020-03-13 NOTE — Assessment & Plan Note (Signed)
With cirrhosis by prior evaluation at [facility].  [place], MRI done in 2019 at [facility] did not note cirrhosis or splenomegaly.   Annual AFP tumor marker and liver doppler ultrasound has been done by GI

## 2020-03-13 NOTE — Assessment & Plan Note (Signed)
LDL and triglycerides are at goal on atorvastatin 20 mg.  He has cirrhosis secondary to NASH, by MRI abdomen done in 2019,   But   liver enzymes have normalized .   Lab Results  Component Value Date   CHOL 101 03/09/2020   HDL 36.00 (L) 03/09/2020   LDLCALC 37 03/09/2020   LDLDIRECT 59.0 03/26/2016   TRIG 140.0 03/09/2020   CHOLHDL 3 03/09/2020   Lab Results  Component Value Date   ALT 23 03/09/2020   AST 18 03/09/2020   ALKPHOS 53 03/09/2020   BILITOT 1.2 03/09/2020

## 2020-03-13 NOTE — Assessment & Plan Note (Signed)
Well controlled on current regimen of losartan 50 mg daily . Renal function stable, no changes today.  Lab Results  Component Value Date   CREATININE 1.15 03/09/2020   Lab Results  Component Value Date   NA 136 03/09/2020   K 4.2 03/09/2020   CL 99 03/09/2020   CO2 29 03/09/2020

## 2020-03-13 NOTE — Assessment & Plan Note (Addendum)
Appears to have reasonable  control on current regimen of metformin but has had elevated readings since Januvia  Was erroneously suspended.  Advised to resume Januvia. Continue ARB and statin.   Lab Results  Component Value Date   HGBA1C 6.9 (H) 03/09/2020   Lab Results  Component Value Date   MICROALBUR <0.7 08/21/2019   MICROALBUR <0.7 03/13/2019

## 2020-04-24 ENCOUNTER — Other Ambulatory Visit: Payer: Self-pay | Admitting: Internal Medicine

## 2020-04-24 DIAGNOSIS — I10 Essential (primary) hypertension: Secondary | ICD-10-CM

## 2020-06-04 ENCOUNTER — Other Ambulatory Visit: Payer: Self-pay | Admitting: Internal Medicine

## 2020-07-24 ENCOUNTER — Other Ambulatory Visit: Payer: Self-pay | Admitting: Internal Medicine

## 2020-09-19 ENCOUNTER — Telehealth: Payer: Self-pay

## 2020-09-19 NOTE — Telephone Encounter (Signed)
Pt called into access nurse on 09/17/20 stating that they have been experiencing a cough and requested Paxlovid medication. Pt was instructed to go to Urgent care or an ED to receive the medication by Access Nurse.  Called and spoke with Chad Gill. Chad Gill states that he is feeling better and only has the cough. Pt reports no other symptoms and states that he sleeps fine and sleeping better. Chad Gill declined going to an urgent care over the weekend and states he lives close to [place]. Pt wanted to accompany his wife to her appointment with Dr. Derrel Nip and request antiviral medication during her appointment. I informed him that he would need to have an appointment of his own to receive antiviral medication and he verbalized agreement to go to urgent care to be evaluated for his cough. Pt decided to go to [facility]gent [facility] which is located 10 minutes from his home in [place]. Pt states that he will give them a call to determine if he wanted to be evaluated today and if they could offer him antiviral medication.

## 2020-09-30 IMAGING — DX DG CHEST 2V
2 series · 2 of 2 positions shown · non-contrast
Comparison: 11/23/2015

CLINICAL DATA: Cough, COVID positive

EXAM:
CHEST - 2 VIEW

[chest pa]
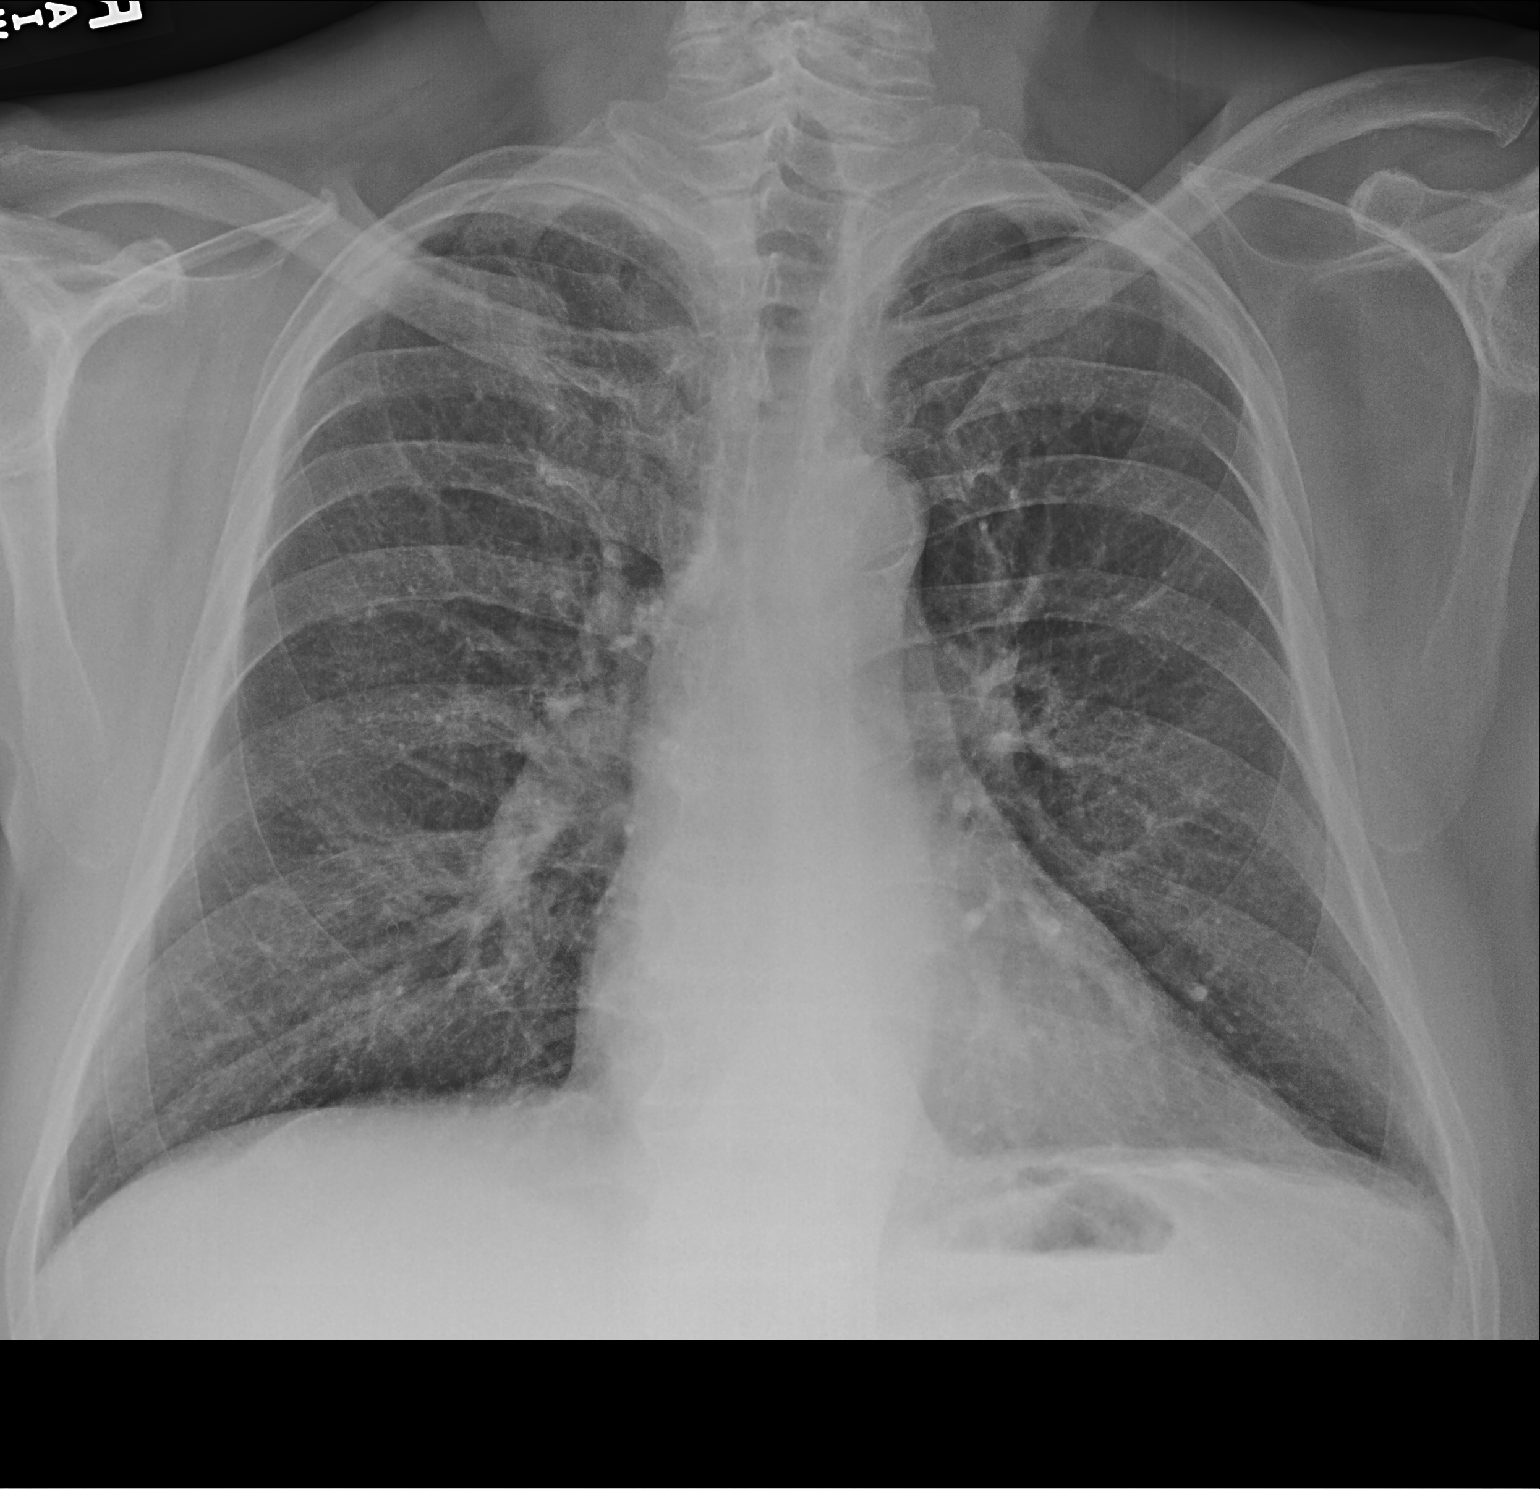

[chest lat]
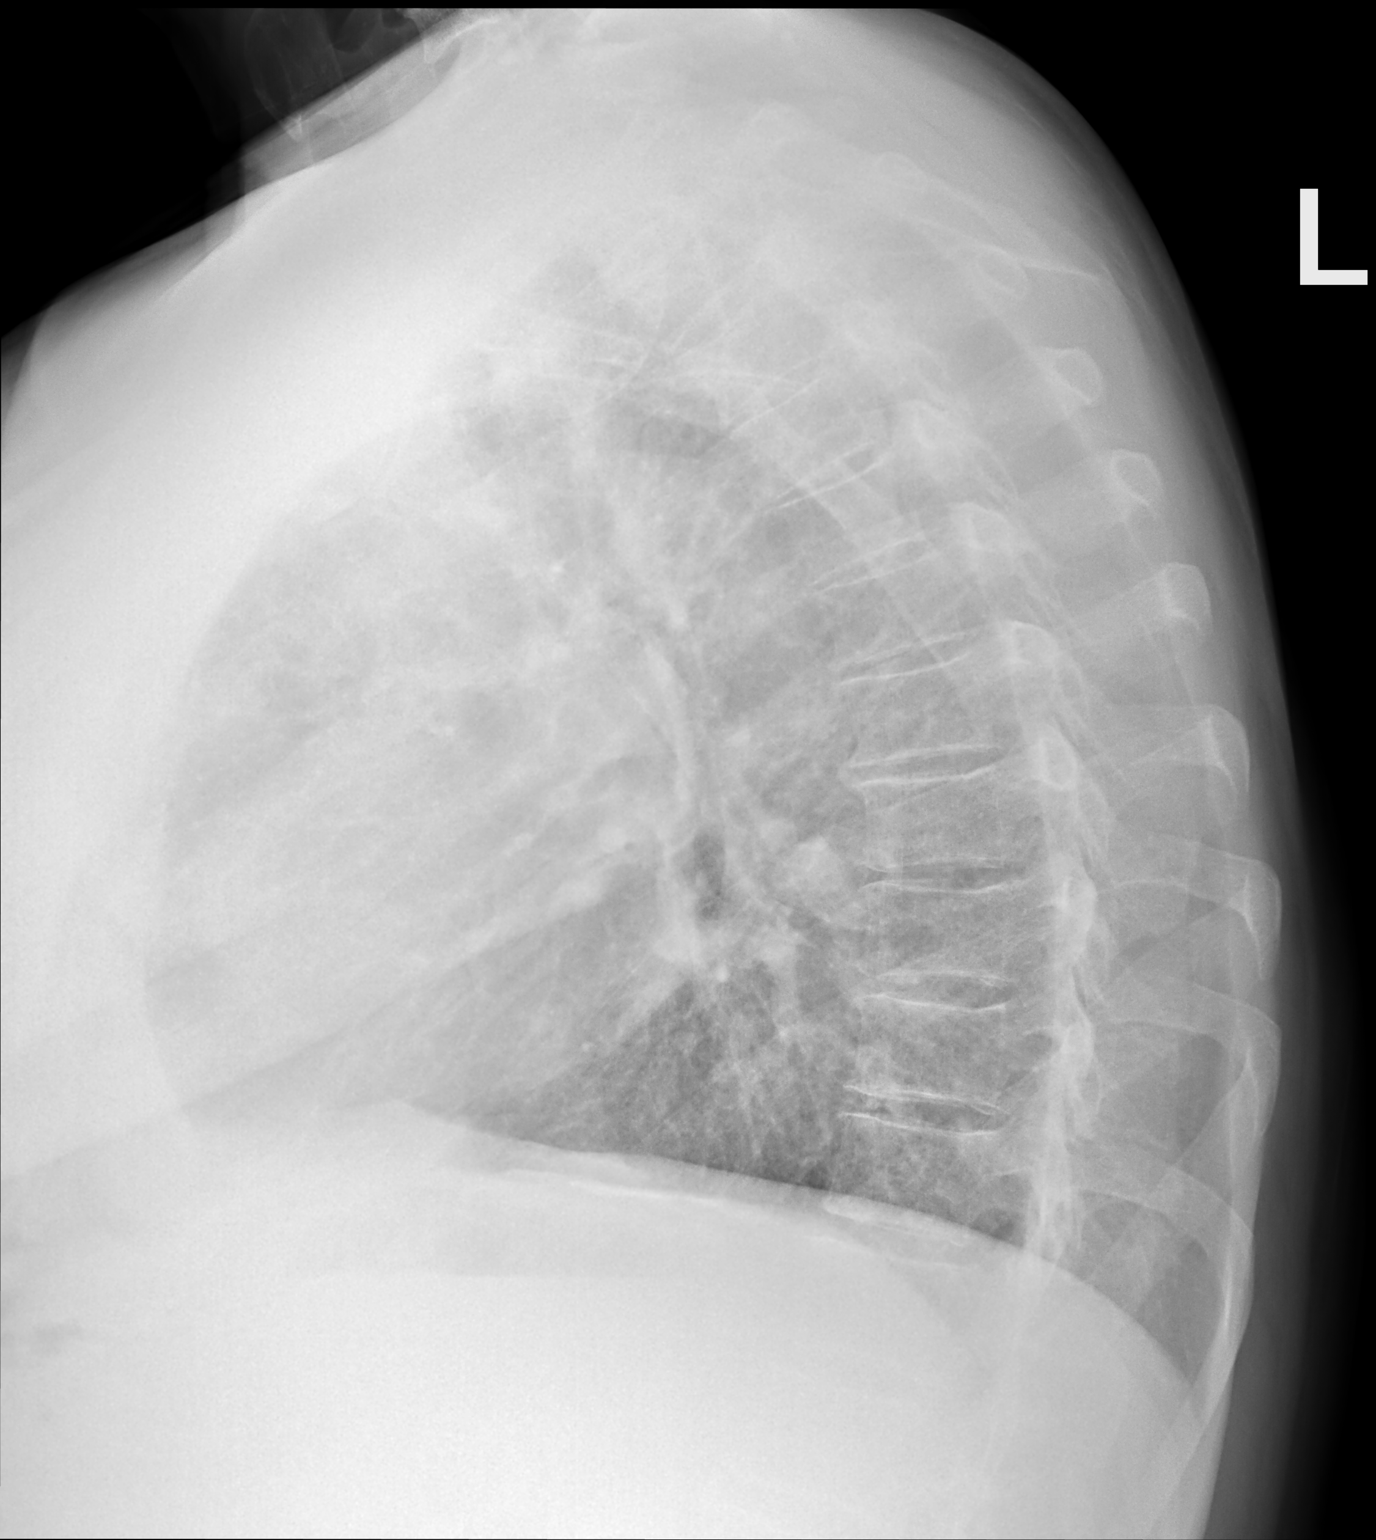

[2 of 2 positions shown; findings below may reference images not displayed]

FINDINGS: Heart and mediastinal contours are within normal limits. No focal
opacities or effusions. No acute bony abnormality.
IMPRESSION: No active cardiopulmonary disease.

## 2020-10-18 ENCOUNTER — Other Ambulatory Visit: Payer: Self-pay | Admitting: Internal Medicine

## 2020-10-18 DIAGNOSIS — I10 Essential (primary) hypertension: Secondary | ICD-10-CM

## 2020-11-25 ENCOUNTER — Other Ambulatory Visit: Payer: Self-pay | Admitting: Internal Medicine

## 2020-12-11 ENCOUNTER — Other Ambulatory Visit: Payer: Self-pay | Admitting: Internal Medicine

## 2020-12-16 ENCOUNTER — Other Ambulatory Visit: Payer: Self-pay | Admitting: Internal Medicine

## 2021-02-08 ENCOUNTER — Encounter: Payer: Self-pay | Admitting: Emergency Medicine

## 2021-02-08 ENCOUNTER — Ambulatory Visit
Admission: EM | Admit: 2021-02-08 | Discharge: 2021-02-08 | Disposition: A | Discharge status: Home / Self Care | Payer: Medicare Other | Attending: Medical Oncology | Admitting: Medical Oncology

## 2021-02-08 ENCOUNTER — Ambulatory Visit (INDEPENDENT_AMBULATORY_CARE_PROVIDER_SITE_OTHER): Payer: Medicare Other

## 2021-02-08 ENCOUNTER — Other Ambulatory Visit: Payer: Self-pay

## 2021-02-08 ENCOUNTER — Telehealth: Payer: Self-pay | Admitting: Internal Medicine

## 2021-02-08 DIAGNOSIS — R051 Acute cough: Secondary | ICD-10-CM

## 2021-02-08 DIAGNOSIS — R0602 Shortness of breath: Secondary | ICD-10-CM | POA: Diagnosis not present

## 2021-02-08 DIAGNOSIS — R059 Cough, unspecified: Secondary | ICD-10-CM

## 2021-02-08 MED ORDER — ALBUTEROL SULFATE HFA 108 (90 BASE) MCG/ACT IN AERS: 1.0000 | INHALATION_SPRAY | Freq: Four times a day (QID) | RESPIRATORY_TRACT | 0 refills | Status: DC | PRN

## 2021-02-08 MED ORDER — PREDNISONE 10 MG PO TABS: 10.0000 mg | ORAL_TABLET | Freq: Every day | ORAL | 0 refills | Status: DC

## 2021-02-08 MED ORDER — BENZONATATE 100 MG PO CAPS: 100.0000 mg | ORAL_CAPSULE | Freq: Three times a day (TID) | ORAL | 0 refills | Status: DC

## 2021-02-08 MED ORDER — FLUTICASONE PROPIONATE 50 MCG/ACT NA SUSP: 2.0000 | Freq: Every day | NASAL | 0 refills | Status: DC

## 2021-02-08 NOTE — Telephone Encounter (Signed)
Pts wife called in regards to pt coughing for the past few days. Pts wife states the coughing has progressed into wheezing. Pt was sent to access nurse.

## 2021-02-08 NOTE — ED Triage Notes (Addendum)
Pt here with cough and chest congestion with intermittent SOB for just over one week. No hx of breathing problems.

## 2021-02-08 NOTE — ED Provider Notes (Addendum)
Chad Gill    CSN: [id number] Arrival date & time: 02/08/21  1807      History   Chief Complaint Chief Complaint  Patient presents with   Cough   Shortness of Breath    HPI Chad Gill is a 75 y.o. male.   HPI  Cold Symptoms: Patient reports that they have had symptoms of cough and chest congestion for the past 7 days. Symptoms are worsening as he has developed SOB over the last few days. No current SOB at this moment.They deny hemoptysis, chest pain, fever or vomiting. No weight changes, peripheral edema. They have tried zycam and mucinex for symptoms. No known sick contacts. No history of COPD or asthma.    Past Medical History:  Diagnosis Date   Barrett's esophagus 2004   last EGD 2007, no metaplasia, due in 2009   Diabetes mellitus    Fatty liver disease, nonalcoholic    GERD (gastroesophageal reflux disease)    Hyperplastic colonic polyp 2004   Lumbar disc herniation 2007   L5   Transaminitis     Patient Active Problem List   Diagnosis Date Noted   Advice given about COVID-19 virus by telephone 03/09/2019   Hyperlipidemia associated with type 2 diabetes mellitus ([place]) 04/16/2015   NAFLD (nonalcoholic fatty liver disease) 01/17/2015   Medicare annual wellness visit, subsequent 10/12/2014   Uses hearing aid 09/10/2013   Tinnitus of both ears 06/03/2013   Obesity, diabetes, and hypertension syndrome ([place]) 08/26/2012   Hypertension 01/30/2012   Obesity (BMI 30-39.9) 07/24/2011   GERD (gastroesophageal reflux disease)    Adenomatous polyps     Past Surgical History:  Procedure Laterality Date   COLONOSCOPY WITH PROPOFOL N/A 12/11/2019   Procedure: COLONOSCOPY WITH PROPOFOL;  Surgeon: Robert Bellow, MD;  Location: [facility] ENDOSCOPY;  Service: Endoscopy;  Laterality: N/A;   SMALL INTESTINE SURGERY     carcinoid tumore, diagnosis disputed   TONSILLECTOMY  age 43       Home Medications    Prior to Admission medications   Medication  Sig Start Date End Date Taking? Authorizing Provider  Ascorbic Acid (VITAMIN C PO) Take 500 mg by mouth.    [provider]  atorvastatin (LIPITOR) 20 MG tablet TAKE 1 TABLET BY MOUTH EVERY DAY 12/12/20   Crecencio Mc, MD  Garlic 5027 MG CAPS Take by mouth.    [provider]  glucose blood test strip Use to check blood sugars once daily 06/04/17   Crecencio Mc, MD  JANUVIA 50 MG tablet TAKE 1 TABLET BY MOUTH EVERY DAY 11/28/20   Crecencio Mc, MD  Lancets [facility] ULTRASOFT) lancets Use as instructed 05/07/17   Crecencio Mc, MD  losartan (COZAAR) 50 MG tablet TAKE 1 TABLET BY MOUTH EVERY DAY 10/19/20   Crecencio Mc, MD  metFORMIN (GLUCOPHAGE) 500 MG tablet TAKE 1 TABLET BY MOUTH TWICE A DAY WITH MEALS 12/16/20   Crecencio Mc, MD  Multiple Vitamins-Minerals (MULTIVITAMIN WITH MINERALS) tablet Take 1 tablet by mouth daily.    [provider]  Multiple Vitamins-Minerals (PRESERVISION AREDS 2 PO) Take 2 tablets by mouth daily.    [provider]  nizatidine (AXID) 300 MG capsule TAKE 1 CAPSULE BY MOUTH AT BEDTIME. 06/16/18   Crecencio Mc, MD  Omega-3 Fatty Acids (FISH OIL) 1000 MG CAPS Take 1,000 mg by mouth daily.    [provider]  omeprazole (PRILOSEC) 20 MG capsule TAKE 1 CAPSULE BY MOUTH  TWICE A DAY 06/06/20   Crecencio Mc, MD  Turmeric 400 MG CAPS Take 1 capsule by mouth daily.    [provider]    Family History Family History  Problem Relation Age of Onset   Hypertension Mother     Social History Social History   Tobacco Use   Smoking status: Former    Types: Cigarettes    Quit date: 01/21/1986    Years since quitting: 35.0   Smokeless tobacco: Never  Vaping Use   Vaping Use: Never used  Substance Use Topics   Alcohol use: Yes    Comment: ocassional beer   Drug use: No     Allergies   Other and Influenza vac split [influenza virus vaccine]   Review of Systems Review of Systems  As stated  above in HPI Physical Exam Triage Vital Signs ED Triage Vitals  Enc Vitals Group     BP 02/08/21 1820 (!) 194/95     Pulse Rate 02/08/21 1820 (!) 102     Resp 02/08/21 1820 20     Temp 02/08/21 1820 97.9 F (36.6 C)     Temp Source 02/08/21 1820 Oral     SpO2 02/08/21 1820 92 %     Weight --      Height --      Head Circumference --      Peak Flow --      Pain Score 02/08/21 1826 0     Pain Loc --      Pain Edu? --      Excl. in [place]? --    No data found.  Updated Vital Signs BP (!) 194/95 (BP Location: Left Arm)    Pulse (!) 102    Temp 97.9 F (36.6 C) (Oral)    Resp 20    SpO2 92%   Physical Exam Vitals and nursing note reviewed.  Constitutional:      General: He is not in acute distress.    Appearance: He is well-developed. He is not ill-appearing, toxic-appearing or diaphoretic.  HENT:     Head: Normocephalic and atraumatic.     Right Ear: Tympanic membrane normal.     Left Ear: Tympanic membrane normal.     Nose: Congestion (mild) and rhinorrhea (mild) present.     Mouth/Throat:     Mouth: Mucous membranes are moist.     Pharynx: Oropharynx is clear. No pharyngeal swelling or oropharyngeal exudate.  Eyes:     Extraocular Movements: Extraocular movements intact.     Pupils: Pupils are equal, round, and reactive to light.  Cardiovascular:     Rate and Rhythm: Normal rate and regular rhythm.  Pulmonary:     Effort: Pulmonary effort is normal.     Breath sounds: Wheezing (scant throughout) present.  Musculoskeletal:     Cervical back: Normal range of motion and neck supple.     Right lower leg: No edema.     Left lower leg: No edema.  Lymphadenopathy:     Cervical: No cervical adenopathy.  Skin:    General: Skin is warm.     Capillary Refill: Capillary refill takes less than 2 seconds.     Coloration: Skin is not cyanotic or pale.     Findings: No erythema.     Nails: There is no clubbing.  Neurological:     Mental Status: He is alert and oriented to  person, place, and time.    UC Treatments / Results  Labs (all  labs ordered are listed, but only abnormal results are displayed) Labs Reviewed - No data to display  EKG   Radiology No results found.  Procedures Procedures (including critical care time)  Medications Ordered in UC Medications - No data to display  Initial Impression / Assessment and Plan / UC Course  I have reviewed the triage vital signs and the nursing notes.  Pertinent labs & imaging results that were available during my care of the patient were reviewed by me and considered in my medical decision making (see chart for details).     New. Chest x ray pending given vitals. BP and O2 improved on recheck. They will monitor.   UPDATE. Chest x ray does not show any pneumonia or other acute abnormality. Likely viral in nature with potential acute bronchitis. Treating with albuterol and prednisone. Discussed. Tessalon and flonase as well. Discussed O2 monitoring and low threshold for ER visit should his O2 go below 90 %. PCP follow up recommended within 7 days.  Final Clinical Impressions(s) / UC Diagnoses   Final diagnoses:  None   Discharge Instructions   None    ED Prescriptions   None    PDMP not reviewed this encounter.   Hughie Closs, PA-C 02/08/21 1855    Hughie Closs, PA-C 02/08/21 [phone]

## 2021-02-08 NOTE — Telephone Encounter (Signed)
Access Nurse called in regards to pt. Due to time of day and no appt availability, pt and wife have been advised to go to Urgent Care or ED. Access Nurse strongly recommends ED

## 2021-02-08 NOTE — Telephone Encounter (Signed)
I called to triage patient & was able to speak with him. He stated that his cough has progressed to wheezing, but he was not SOB unless he were to walk up stairs. I explained that with lack of appointments here & being so late in the day he needed to be evaluated by UC or ED. Also Dr. Derrel Nip not in office the remainder of the week.  Pt was willing to go today to [facility] across from [place]. I advised that someone needed to be able to listen to his lungs. Pt verbalized understanding of this & I asked that wife keep [place] updated.

## 2021-02-09 NOTE — Telephone Encounter (Signed)
Pt was seen and treated at [facility].

## 2021-02-09 NOTE — Telephone Encounter (Signed)
noted 

## 2021-02-24 ENCOUNTER — Other Ambulatory Visit: Payer: Self-pay | Admitting: Internal Medicine

## 2021-02-24 DIAGNOSIS — I10 Essential (primary) hypertension: Secondary | ICD-10-CM

## 2021-03-31 ENCOUNTER — Other Ambulatory Visit: Payer: Self-pay

## 2021-03-31 ENCOUNTER — Encounter: Payer: Self-pay | Admitting: Internal Medicine

## 2021-03-31 ENCOUNTER — Ambulatory Visit (INDEPENDENT_AMBULATORY_CARE_PROVIDER_SITE_OTHER): Payer: Medicare Other | Admitting: Internal Medicine

## 2021-03-31 VITALS — BP 128/80 | HR 79 | Temp 97.6°F | Ht 70.0 in | Wt 228.0 lb

## 2021-03-31 DIAGNOSIS — E119 Type 2 diabetes mellitus without complications: Secondary | ICD-10-CM | POA: Diagnosis not present

## 2021-03-31 DIAGNOSIS — I10 Essential (primary) hypertension: Secondary | ICD-10-CM

## 2021-03-31 DIAGNOSIS — E1169 Type 2 diabetes mellitus with other specified complication: Secondary | ICD-10-CM | POA: Diagnosis not present

## 2021-03-31 DIAGNOSIS — I350 Nonrheumatic aortic (valve) stenosis: Secondary | ICD-10-CM

## 2021-03-31 DIAGNOSIS — K7460 Unspecified cirrhosis of liver: Secondary | ICD-10-CM

## 2021-03-31 DIAGNOSIS — R5383 Other fatigue: Secondary | ICD-10-CM

## 2021-03-31 DIAGNOSIS — E785 Hyperlipidemia, unspecified: Secondary | ICD-10-CM | POA: Diagnosis not present

## 2021-03-31 DIAGNOSIS — Z125 Encounter for screening for malignant neoplasm of prostate: Secondary | ICD-10-CM | POA: Diagnosis not present

## 2021-03-31 LAB — COMPREHENSIVE METABOLIC PANEL
ALT: 23 U/L (ref 0–53)
AST: 18 U/L (ref 0–37)
Albumin: 4.2 g/dL (ref 3.5–5.2)
Alkaline Phosphatase: 49 U/L (ref 39–117)
BUN: 11 mg/dL (ref 6–23)
CO2: 29 mEq/L (ref 19–32)
Calcium: 9 mg/dL (ref 8.4–10.5)
Chloride: 98 mEq/L (ref 96–112)
Creatinine, Ser: 1.18 mg/dL (ref 0.40–1.50)
GFR: 60.07 mL/min (ref >60.00)
Glucose, Bld: 170 mg/dL — ABNORMAL HIGH (ref 70–99)
Potassium: 4.4 mEq/L (ref 3.5–5.1)
Sodium: 134 mEq/L — ABNORMAL LOW (ref 135–145)
Total Bilirubin: 1.2 mg/dL (ref 0.2–1.2)
Total Protein: 6.7 g/dL (ref 6.0–8.3)

## 2021-03-31 LAB — CBC WITH DIFFERENTIAL/PLATELET
Basophils Absolute: 0.1 10*3/uL (ref 0.0–0.1)
Basophils Relative: 0.8 % (ref 0.0–3.0)
Eosinophils Absolute: 0.3 10*3/uL (ref 0.0–0.7)
Eosinophils Relative: 3.6 % (ref 0.0–5.0)
HCT: 41.7 % (ref 39.0–52.0)
Hemoglobin: 14.4 g/dL (ref 13.0–17.0)
Lymphocytes Relative: 14.1 % (ref 12.0–46.0)
Lymphs Abs: 1.1 10*3/uL (ref 0.7–4.0)
MCHC: 34.4 g/dL (ref 30.0–36.0)
MCV: 85.7 fl (ref 78.0–100.0)
Monocytes Absolute: 0.5 10*3/uL (ref 0.1–1.0)
Monocytes Relative: 6.7 % (ref 3.0–12.0)
Neutro Abs: 5.8 10*3/uL (ref 1.4–7.7)
Neutrophils Relative %: 74.8 % (ref 43.0–77.0)
Platelets: 209 10*3/uL (ref 150.0–400.0)
RBC: 4.87 Mil/uL (ref 4.22–5.81)
RDW: 14.5 % (ref 11.5–15.5)
WBC: 7.8 10*3/uL (ref 4.0–10.5)

## 2021-03-31 LAB — LIPID PANEL
Cholesterol: 106 mg/dL (ref 0–200)
HDL: 37.1 mg/dL — ABNORMAL LOW
LDL Cholesterol: 46 mg/dL (ref 0–99)
NonHDL: 68.71
Total CHOL/HDL Ratio: 3
Triglycerides: 116 mg/dL (ref 0.0–149.0)
VLDL: 23.2 mg/dL (ref 0.0–40.0)

## 2021-03-31 LAB — MICROALBUMIN / CREATININE URINE RATIO
Creatinine,U: 97.5 mg/dL
Microalb Creat Ratio: 0.7 mg/g (ref 0.0–30.0)
Microalb, Ur: <0.7 mg/dL (ref 0.0–1.9)

## 2021-03-31 LAB — HEMOGLOBIN A1C: Hgb A1c MFr Bld: 6.8 % — ABNORMAL HIGH (ref 4.6–6.5)

## 2021-03-31 MED ORDER — TETANUS-DIPHTH-ACELL PERTUSSIS 5-2.5-18.5 LF-MCG/0.5 IM SUSY: 0.5000 mL | PREFILLED_SYRINGE | Freq: Once | INTRAMUSCULAR | 0 refills | Status: AC

## 2021-03-31 MED ORDER — ZOSTER VAC RECOMB ADJUVANTED 50 MCG/0.5ML IM SUSR: 0.5000 mL | Freq: Once | INTRAMUSCULAR | 1 refills | Status: AC

## 2021-03-31 NOTE — Patient Instructions (Addendum)
°  I do not hear any wheezing today,  but it could happen due to allergies,,  so:  I recommend taking an antihistamine daily to prevent wheezing from allergies .  Here are your choices,  all available at [employer] generically for a lot less $$$:   generic zyrtec, which is cetirizine.  Allegra is availabel generically as fexofenadine and it comes in 60 mg and 180 mg once daily strengths.  The claritin is also available generically as loratidine .     If you develop shortness of breath, , your aortic stenosis may be the cause,  and a repeat ECHO should be done    The Tdap (tetanus-diphtheria-whooping cough vaccine ) and Shingrix vaccines are now  COVERED BY MEDICARE if you get them at your pharmacy as of  January 1.   You can expect 24 hours of flu like symptoms after receiving the shingles vaccine, so plan accordingly.

## 2021-04-02 DIAGNOSIS — I350 Nonrheumatic aortic (valve) stenosis: Secondary | ICD-10-CM | POA: Insufficient documentation

## 2021-04-02 NOTE — Assessment & Plan Note (Addendum)
Mild, by prior ECHO done in 2017  To evaluate systolic murmur. He denies dyspnea with activity but is not active.  He has no signs  Of progression

## 2021-04-02 NOTE — Assessment & Plan Note (Signed)
LDL and triglycerides are at goal on atorvastatin 20 mg.  He has cirrhosis secondary to NASH, by MRI abdomen done in 2019,   But   liver enzymes have normalized .   Diabetes is controlled with metformin and Januvia . Will recommend GLP 1 agonist at next visit for added benefits to obesity and NASH cirrhosis   Lab Results  Component Value Date   CHOL 106 03/31/2021   HDL 37.10 (L) 03/31/2021   LDLCALC 46 03/31/2021   LDLDIRECT 59.0 03/26/2016   TRIG 116.0 03/31/2021   CHOLHDL 3 03/31/2021   Lab Results  Component Value Date   ALT 23 03/31/2021   AST 18 03/31/2021   ALKPHOS 49 03/31/2021   BILITOT 1.2 03/31/2021   Lab Results  Component Value Date   HGBA1C 6.8 (H) 03/31/2021

## 2021-04-02 NOTE — Progress Notes (Signed)
Subjective:  Patient ID: Chad Gill, male    DOB: [birth year]  Age: 76 y.o. MRN: [id number]  CC: The primary encounter diagnosis was Hyperlipidemia associated with type 2 diabetes mellitus ([place]). Diagnoses of Essential hypertension, Diabetes mellitus without complication ([place]), Prostate cancer screening, Other fatigue, Cirrhosis, nonalcoholic ([place]), and Aortic valve stenosis, etiology of cardiac valve disease unspecified were also pertinent to this visit.   This visit occurred during the SARS-CoV-2 public health emergency.  Safety protocols were in place, including screening questions prior to the visit, additional usage of staff PPE, and extensive cleaning of exam room while observing appropriate contact time as indicated for disinfecting solutions.    HPI DELSON DULWORTH presents for  Chief Complaint  Patient presents with   Follow-up    37mofollow up for HTN    1) Hypertension: patient checks blood pressure twice weekly at home.  Readings have been for the most part < 140/80 at rest . Patient is following a reduce dsalt diet most days and is taking medications as prescribed   2)  T2DM:  Se  feels generally well,  But is not  exercising regularly or trying to lose weight. Checking  blood sugars less than once daily at variable times, usually only if he feels she may be having a hypoglycemic event. .  BS have been under 130 fasting and < 150 post prandially.  Denies any recent hypoglyemic events.  Taking   metformin, Januvia  medications as directed. Following a carbohydrate modified diet 6 days per week. Denies numbness, burning and tingling of extremities. Appetite is good.  3) Nonalcoholic cirrhosis of liver with splenomegaly: asymptomatic.  No longer   followed by GI.  Annual screening for hepatocellular Ca needed.   4) Reports of wheezing  by wife.  Patient denies orthopnea ,  cough and shortness of breath.    Outpatient Medications Prior to Visit  Medication Sig Dispense  Refill   Garlic 17829MG CAPS Take by mouth.     glucose blood test strip Use to check blood sugars once daily 100 each 12   JANUVIA 50 MG tablet TAKE 1 TABLET BY MOUTH EVERY DAY 90 tablet 1   Lancets (ONETOUCH ULTRASOFT) lancets Use as instructed 100 each 12   losartan (COZAAR) 50 MG tablet TAKE 1 TABLET BY MOUTH EVERY DAY 90 tablet 0   metFORMIN (GLUCOPHAGE) 500 MG tablet TAKE 1 TABLET BY MOUTH TWICE A DAY WITH MEALS 180 tablet 3   Multiple Vitamins-Minerals (MULTIVITAMIN WITH MINERALS) tablet Take 1 tablet by mouth daily.     Multiple Vitamins-Minerals (PRESERVISION AREDS 2 PO) Take 2 tablets by mouth daily.     nizatidine (AXID) 300 MG capsule TAKE 1 CAPSULE BY MOUTH AT BEDTIME. 90 capsule 1   Omega-3 Fatty Acids (FISH OIL) 1000 MG CAPS Take 1,000 mg by mouth daily.     omeprazole (PRILOSEC) 20 MG capsule TAKE 1 CAPSULE BY MOUTH TWICE A DAY 180 capsule 1   predniSONE (DELTASONE) 10 MG tablet Take 1 tablet (10 mg total) by mouth daily with breakfast. 5 tablet 0   Turmeric 400 MG CAPS Take 1 capsule by mouth daily.     albuterol (VENTOLIN HFA) 108 (90 Base) MCG/ACT inhaler Inhale 1-2 puffs into the lungs every 6 (six) hours as needed for wheezing or shortness of breath. 8 each 0   Ascorbic Acid (VITAMIN C PO) Take 500 mg by mouth. (Patient not taking: Reported on 03/31/2021)     atorvastatin (LIPITOR)  20 MG tablet TAKE 1 TABLET BY MOUTH EVERY DAY (Patient not taking: Reported on 03/31/2021) 90 tablet 1   benzonatate (TESSALON) 100 MG capsule Take 1 capsule (100 mg total) by mouth every 8 (eight) hours. (Patient not taking: Reported on 03/31/2021) 21 capsule 0   fluticasone (FLONASE) 50 MCG/ACT nasal spray Place 2 sprays into both nostrils daily. 16 mL 0   No facility-administered medications prior to visit.    Review of Systems;  Patient denies headache, fevers, malaise, unintentional weight loss, skin rash, eye pain, sinus congestion and sinus pain, sore throat, dysphagia,  hemoptysis ,  cough, dyspnea, wheezing, chest pain, palpitations, orthopnea, edema, abdominal pain, nausea, melena, diarrhea, constipation, flank pain, dysuria, hematuria, urinary  Frequency, nocturia, numbness, tingling, seizures,  Focal weakness, Loss of consciousness,  Tremor, insomnia, depression, anxiety, and suicidal ideation.      Objective:  BP 128/80 (BP Location: Left Arm, Patient Position: Sitting, Cuff Size: Large)    Pulse 79    Temp 97.6 F (36.4 C) (Oral)    Ht _0  (1.778 m)    Wt 228 lb (103.4 kg)    SpO2 96%    BMI 32.71 kg/m   BP Readings from Last 3 Encounters:  03/31/21 128/80  02/08/21 (!) 159/78  03/11/20 116/77    Wt Readings from Last 3 Encounters:  03/31/21 228 lb (103.4 kg)  03/11/20 220 lb (99.8 kg)  12/11/19 222 lb (100.7 kg)    General appearance: alert, cooperative and appears stated age Ears: normal TM's and external ear canals both ears Throat: lips, mucosa, and tongue normal; teeth and gums normal Neck: no adenopathy, no carotid bruit, supple, symmetrical, trachea midline and thyroid not enlarged, symmetric, no tenderness/mass/nodules Back: symmetric, no curvature. ROM normal. No CVA tenderness. Lungs: clear to auscultation bilaterally Heart: regular rate and rhythm, S1, S2 normal, no murmur, click, rub or gallop Abdomen: soft, non-tender; bowel sounds normal; no masses,  no organomegaly Pulses: 2+ and symmetric Skin: Skin color, texture, turgor normal. No rashes or lesions Lymph nodes: Cervical, supraclavicular, and axillary nodes normal.  Lab Results  Component Value Date   HGBA1C 6.8 (H) 03/31/2021   HGBA1C 6.9 (H) 03/09/2020   HGBA1C 6.1 08/21/2019    Lab Results  Component Value Date   CREATININE 1.18 03/31/2021   CREATININE 1.15 03/09/2020   CREATININE 1.13 08/21/2019    Lab Results  Component Value Date   WBC 7.8 03/31/2021   HGB 14.4 03/31/2021   HCT 41.7 03/31/2021   PLT 209.0 03/31/2021   GLUCOSE 170 (H) 03/31/2021   CHOL 106  03/31/2021   TRIG 116.0 03/31/2021   HDL 37.10 (L) 03/31/2021   LDLDIRECT 59.0 03/26/2016   LDLCALC 46 03/31/2021   ALT 23 03/31/2021   AST 18 03/31/2021   NA 134 (L) 03/31/2021   K 4.4 03/31/2021   CL 98 03/31/2021   CREATININE 1.18 03/31/2021   BUN 11 03/31/2021   CO2 29 03/31/2021   PSA 1.35 03/13/2019   HGBA1C 6.8 (H) 03/31/2021   MICROALBUR <0.7 03/31/2021    DG Chest 2 View  Result Date: 02/08/2021 CLINICAL DATA:  Cough and shortness of breath. EXAM: CHEST - 2 VIEW COMPARISON:  Chest x-ray 06/24/2019. FINDINGS: The heart size and mediastinal contours are within normal limits. Both lungs are clear. The visualized skeletal structures are unremarkable. IMPRESSION: No active cardiopulmonary disease. Electronically Signed   By: Ronney Asters M.D.   On: 02/08/2021 18:47    Assessment & Plan:  Problem List Items Addressed This Visit     Aortic valve stenosis    Mild, by prior ECHO done in 2017  To evaluate systolic murmur. He denies dyspnea with activity but is not active.  He has no signs  Of progression       Hyperlipidemia associated with type 2 diabetes mellitus ([place]) - Primary    LDL and triglycerides are at goal on atorvastatin 20 mg.  He has cirrhosis secondary to NASH, by MRI abdomen done in 2019,   But   liver enzymes have normalized .   Diabetes is controlled with metformin and Januvia . Will recommend GLP 1 agonist at next visit for added benefits to obesity and NASH cirrhosis   Lab Results  Component Value Date   CHOL 106 03/31/2021   HDL 37.10 (L) 03/31/2021   LDLCALC 46 03/31/2021   LDLDIRECT 59.0 03/26/2016   TRIG 116.0 03/31/2021   CHOLHDL 3 03/31/2021   Lab Results  Component Value Date   ALT 23 03/31/2021   AST 18 03/31/2021   ALKPHOS 49 03/31/2021   BILITOT 1.2 03/31/2021   Lab Results  Component Value Date   HGBA1C 6.8 (H) 03/31/2021         Relevant Orders   Lipid Profile (Completed)   Other Visit Diagnoses     Essential hypertension        Relevant Orders   Comp Met (CMET) (Completed)   Urine Microalbumin w/creat. ratio (Completed)   Diabetes mellitus without complication (HCC)       Relevant Orders   HgB A1c (Completed)   Urine Microalbumin w/creat. ratio (Completed)   Prostate cancer screening       Other fatigue       Relevant Orders   CBC with Differential/Platelet (Completed)   Cirrhosis, nonalcoholic (HCC)       Relevant Orders   AFP tumor marker   US LIVER DOPPLER       I spent 30 minutes dedicated to the care of this patient on the date of this encounter to include pre-visit review of patient's medical history,  most recent imaging studies, Face-to-face time with the patient , and post visit ordering of testing and therapeutics.    Follow-up: Return in about 6 months (around 09/28/2021) for follow up diabetes.   Crecencio Mc, MD

## 2021-04-13 ENCOUNTER — Ambulatory Visit
Admission: RE | Admit: 2021-04-13 | Discharge: 2021-04-13 | Disposition: A | Discharge status: Home / Self Care | Payer: Medicare Other | Source: Ambulatory Visit | Attending: Internal Medicine | Admitting: Internal Medicine

## 2021-04-13 DIAGNOSIS — K7460 Unspecified cirrhosis of liver: Secondary | ICD-10-CM | POA: Diagnosis present

## 2021-05-27 ENCOUNTER — Other Ambulatory Visit: Payer: Self-pay | Admitting: Internal Medicine

## 2021-06-07 ENCOUNTER — Other Ambulatory Visit: Payer: Self-pay | Admitting: Internal Medicine

## 2021-06-16 ENCOUNTER — Other Ambulatory Visit: Payer: Self-pay | Admitting: Internal Medicine

## 2021-06-16 DIAGNOSIS — I10 Essential (primary) hypertension: Secondary | ICD-10-CM

## 2021-09-13 ENCOUNTER — Other Ambulatory Visit: Payer: Self-pay | Admitting: Internal Medicine

## 2021-09-13 DIAGNOSIS — I10 Essential (primary) hypertension: Secondary | ICD-10-CM

## 2021-09-28 ENCOUNTER — Ambulatory Visit: Payer: Medicare Other | Admitting: Internal Medicine

## 2021-10-04 ENCOUNTER — Ambulatory Visit (INDEPENDENT_AMBULATORY_CARE_PROVIDER_SITE_OTHER): Payer: Medicare Other | Admitting: Internal Medicine

## 2021-10-04 ENCOUNTER — Encounter: Payer: Self-pay | Admitting: Internal Medicine

## 2021-10-04 VITALS — BP 132/88 | HR 76 | Temp 97.4°F | Ht 70.0 in | Wt 225.0 lb

## 2021-10-04 DIAGNOSIS — I10 Essential (primary) hypertension: Secondary | ICD-10-CM | POA: Diagnosis not present

## 2021-10-04 DIAGNOSIS — E669 Obesity, unspecified: Secondary | ICD-10-CM

## 2021-10-04 DIAGNOSIS — K7460 Unspecified cirrhosis of liver: Secondary | ICD-10-CM

## 2021-10-04 DIAGNOSIS — E1169 Type 2 diabetes mellitus with other specified complication: Secondary | ICD-10-CM | POA: Diagnosis not present

## 2021-10-04 DIAGNOSIS — K760 Fatty (change of) liver, not elsewhere classified: Secondary | ICD-10-CM | POA: Diagnosis not present

## 2021-10-04 DIAGNOSIS — I350 Nonrheumatic aortic (valve) stenosis: Secondary | ICD-10-CM

## 2021-10-04 DIAGNOSIS — E1159 Type 2 diabetes mellitus with other circulatory complications: Secondary | ICD-10-CM

## 2021-10-04 DIAGNOSIS — K2100 Gastro-esophageal reflux disease with esophagitis, without bleeding: Secondary | ICD-10-CM | POA: Diagnosis not present

## 2021-10-04 LAB — LIPID PANEL
Cholesterol: 98 mg/dL (ref 0–200)
HDL: 35 mg/dL — ABNORMAL LOW (ref >39.00)
LDL Cholesterol: 37 mg/dL (ref 0–99)
NonHDL: 63.46
Total CHOL/HDL Ratio: 3
Triglycerides: 132 mg/dL (ref 0.0–149.0)
VLDL: 26.4 mg/dL (ref 0.0–40.0)

## 2021-10-04 LAB — COMPREHENSIVE METABOLIC PANEL
ALT: 17 U/L (ref 0–53)
AST: 15 U/L (ref 0–37)
Albumin: 4.3 g/dL (ref 3.5–5.2)
Alkaline Phosphatase: 52 U/L (ref 39–117)
BUN: 11 mg/dL (ref 6–23)
CO2: 29 mEq/L (ref 19–32)
Calcium: 9.2 mg/dL (ref 8.4–10.5)
Chloride: 98 mEq/L (ref 96–112)
Creatinine, Ser: 1.1 mg/dL (ref 0.40–1.50)
GFR: 65.11 mL/min (ref >60.00)
Glucose, Bld: 156 mg/dL — ABNORMAL HIGH (ref 70–99)
Potassium: 4.4 mEq/L (ref 3.5–5.1)
Sodium: 133 mEq/L — ABNORMAL LOW (ref 135–145)
Total Bilirubin: 1.3 mg/dL — ABNORMAL HIGH (ref 0.2–1.2)
Total Protein: 6.4 g/dL (ref 6.0–8.3)

## 2021-10-04 LAB — LDL CHOLESTEROL, DIRECT: Direct LDL: 50 mg/dL

## 2021-10-04 LAB — HEMOGLOBIN A1C: Hgb A1c MFr Bld: 6.7 % — ABNORMAL HIGH (ref 4.6–6.5)

## 2021-10-04 NOTE — Patient Instructions (Addendum)
You are overdue for your diabetic eye exam.  (Due annually)  Your nighttime cough may be due to reflux.  Please try elevating the head of your bed

## 2021-10-04 NOTE — Assessment & Plan Note (Addendum)
Excellent control on metformin and Januvia.   Continue ARB and statin.   Lab Results  Component Value Date   HGBA1C 6.7 (H) 10/04/2021   Lab Results  Component Value Date   MICROALBUR <0.7 03/31/2021   MICROALBUR <0.7 08/21/2019

## 2021-10-04 NOTE — Assessment & Plan Note (Signed)
With cirrhosis by prior evaluation at [facility].  .   Annual AFP tumor marker is pending,   and liver doppler ultrasound has been done and is negative for masses.   Lab Results  Component Value Date   ALT 17 10/04/2021   AST 15 10/04/2021   ALKPHOS 52 10/04/2021   BILITOT 1.3 (H) 10/04/2021

## 2021-10-04 NOTE — Assessment & Plan Note (Signed)
Mild by 2017 ECHO.   (radiates to left carotid) asymptomatic .  walks daily with SOB

## 2021-10-04 NOTE — Assessment & Plan Note (Signed)
Well controlled on current regimen of losartan 50 mg daily . Renal function stable, no changes today.  Lab Results  Component Value Date   CREATININE 1.10 10/04/2021   Lab Results  Component Value Date   NA 133 (L) 10/04/2021   K 4.4 10/04/2021   CL 98 10/04/2021   CO2 29 10/04/2021   Lab Results  Component Value Date   MICROALBUR <0.7 03/31/2021   MICROALBUR <0.7 08/21/2019

## 2021-10-04 NOTE — Progress Notes (Signed)
Subjective:  Patient ID: Chad Gill, male    DOB: August 02, 1945  Age: 76 y.o. MRN: [id number]  CC: The primary encounter diagnosis was Obesity, diabetes, and hypertension syndrome ([place]). Diagnoses of Gastroesophageal reflux disease with esophagitis without hemorrhage, NAFLD (nonalcoholic fatty liver disease), Cirrhosis of liver without ascites, unspecified hepatic cirrhosis type ([place]), Aortic valve stenosis, etiology of cardiac valve disease unspecified, and Primary hypertension were also pertinent to this visit.   HPI Chad Gill presents for follow up on type 2 DM,  hypertension, hyperlipidemia and cirrhosis secondary to NASH,  Chief Complaint  Patient presents with   Follow-up    6 month follow up on diabetes, hypertension, hyperlipidemia   1) T2DM:  he  feels generally well,  But is not  exercising regularly or trying to lose weight. Checking  blood sugars less than once daily at variable times,  BS have been under 130 fasting and < 150 post prandially.  Denies any recent hypoglyemic events.  Taking   medications as directed. Following a carbohydrate modified diet 6 days per week. Denies numbness, burning and tingling of extremities. Appetite is good.    2) NASH cirrhosis :  annual ultrasound was done in February .  No masses.  AFP tumor marker  has not been done.  3) Nocturnal cough for the last several months.  No orthopnea  or LE edema.  Has GERD    Outpatient Medications Prior to Visit  Medication Sig Dispense Refill   Ascorbic Acid (VITAMIN C PO) Take 500 mg by mouth.     atorvastatin (LIPITOR) 20 MG tablet TAKE 1 TABLET BY MOUTH EVERY DAY 90 tablet 3   glucose blood test strip Use to check blood sugars once daily 100 each 12   JANUVIA 50 MG tablet TAKE 1 TABLET BY MOUTH EVERY DAY 90 tablet 1   Lancets (ONETOUCH ULTRASOFT) lancets Use as instructed 100 each 12   losartan (COZAAR) 50 MG tablet TAKE 1 TABLET BY MOUTH EVERY DAY 90 tablet 0   metFORMIN (GLUCOPHAGE) 500 MG  tablet TAKE 1 TABLET BY MOUTH TWICE A DAY WITH MEALS 180 tablet 3   Multiple Vitamins-Minerals (MULTIVITAMIN WITH MINERALS) tablet Take 1 tablet by mouth daily.     Multiple Vitamins-Minerals (PRESERVISION AREDS 2 PO) Take 2 tablets by mouth daily.     Omega-3 Fatty Acids (FISH OIL) 1000 MG CAPS Take 1,000 mg by mouth daily.     omeprazole (PRILOSEC) 20 MG capsule TAKE 1 CAPSULE BY MOUTH TWICE A DAY 180 capsule 1   Turmeric 400 MG CAPS Take 1 capsule by mouth daily.     albuterol (VENTOLIN HFA) 108 (90 Base) MCG/ACT inhaler Inhale 1-2 puffs into the lungs every 6 (six) hours as needed for wheezing or shortness of breath. 8 each 0   benzonatate (TESSALON) 100 MG capsule Take 1 capsule (100 mg total) by mouth every 8 (eight) hours. (Patient not taking: Reported on 03/31/2021) 21 capsule 0   fluticasone (FLONASE) 50 MCG/ACT nasal spray Place 2 sprays into both nostrils daily. 16 mL 0   Garlic 8882 MG CAPS Take by mouth. (Patient not taking: Reported on 10/04/2021)     nizatidine (AXID) 300 MG capsule TAKE 1 CAPSULE BY MOUTH AT BEDTIME. (Patient not taking: Reported on 10/04/2021) 90 capsule 1   predniSONE (DELTASONE) 10 MG tablet Take 1 tablet (10 mg total) by mouth daily with breakfast. (Patient not taking: Reported on 10/04/2021) 5 tablet 0   No facility-administered medications prior to  visit.    Review of Systems;  Patient denies headache, fevers, malaise, unintentional weight loss, skin rash, eye pain, sinus congestion and sinus pain, sore throat, dysphagia,  hemoptysis , cough, dyspnea, wheezing, chest pain, palpitations, orthopnea, edema, abdominal pain, nausea, melena, diarrhea, constipation, flank pain, dysuria, hematuria, urinary  Frequency, nocturia, numbness, tingling, seizures,  Focal weakness, Loss of consciousness,  Tremor, insomnia, depression, anxiety, and suicidal ideation.      Objective:  BP 132/88 (BP Location: Left Arm, Patient Position: Sitting, Cuff Size: Large)   Pulse 76    Temp (!) 97.4 F (36.3 C) (Oral)   Ht _0  (1.778 m)   Wt 225 lb (102.1 kg)   SpO2 97%   BMI 32.28 kg/m   BP Readings from Last 3 Encounters:  10/04/21 132/88  03/31/21 128/80  02/08/21 (!) 159/78    Wt Readings from Last 3 Encounters:  10/04/21 225 lb (102.1 kg)  03/31/21 228 lb (103.4 kg)  03/11/20 220 lb (99.8 kg)    General appearance: alert, cooperative and appears stated age Ears: normal TM's and external ear canals both ears Throat: lips, mucosa, and tongue normal; teeth and gums normal Neck: no adenopathy, no carotid bruit, supple, symmetrical, trachea midline and thyroid not enlarged, symmetric, no tenderness/mass/nodules Back: symmetric, no curvature. ROM normal. No CVA tenderness. Lungs: clear to auscultation bilaterally Heart: regular rate and rhythm, S1, S2 normal, no murmur, click, rub or gallop Abdomen: soft, non-tender; bowel sounds normal; no masses,  no organomegaly Pulses: 2+ and symmetric Skin: Skin color, texture, turgor normal. No rashes or lesions Lymph nodes: Cervical, supraclavicular, and axillary nodes normal.  Lab Results  Component Value Date   HGBA1C 6.7 (H) 10/04/2021   HGBA1C 6.8 (H) 03/31/2021   HGBA1C 6.9 (H) 03/09/2020    Lab Results  Component Value Date   CREATININE 1.10 10/04/2021   CREATININE 1.18 03/31/2021   CREATININE 1.15 03/09/2020    Lab Results  Component Value Date   WBC 7.8 03/31/2021   HGB 14.4 03/31/2021   HCT 41.7 03/31/2021   PLT 209.0 03/31/2021   GLUCOSE 156 (H) 10/04/2021   CHOL 98 10/04/2021   TRIG 132.0 10/04/2021   HDL 35.00 (L) 10/04/2021   LDLDIRECT 50.0 10/04/2021   LDLCALC 37 10/04/2021   ALT 17 10/04/2021   AST 15 10/04/2021   NA 133 (L) 10/04/2021   K 4.4 10/04/2021   CL 98 10/04/2021   CREATININE 1.10 10/04/2021   BUN 11 10/04/2021   CO2 29 10/04/2021   PSA 1.35 03/13/2019   HGBA1C 6.7 (H) 10/04/2021   MICROALBUR <0.7 03/31/2021    US LIVER DOPPLER  Result Date:  04/13/2021 CLINICAL DATA:  History of non alcoholic cirrhosis. Evaluate hepatic vasculature. EXAM: DUPLEX ULTRASOUND OF LIVER TECHNIQUE: Color and duplex Doppler ultrasound was performed to evaluate the hepatic in-flow and out-flow vessels. COMPARISON:  03/26/2019; abdominal MRI 06/21/2017 FINDINGS: Liver: There is diffuse increased echogenicity of the hepatic parenchyma. This finding is associated with suspected mild nodularity of the hepatic contour (image 15). There is an approximately 1.9 x 1.8 x 1.6 cm minimally complex cyst within the left lobe of the liver as was characterized on abdominal MRI performed 06/21/2017 note is also made of a punctate (approximately 0.5 cm) hypoechoic lesion in the right lobe of the liver which is too small to accurately characterize though also favored to represent 1 of the hepatic cysts demonstrated on remote abdominal MRI. No discrete worrisome hepatic lesions. Limited sonographic evaluation of the gallbladder  is normal. No intrahepatic biliary ductal dilatation. Portal Vein Velocities (normal hepatopetal directional flow) Main:  31 cm/sec Right:  19 cm/sec Left:  23 cm/sec Hepatic Vein Velocities (normal hepatofugal direction of flow) Right:  17 cm/sec Middle:  32 cm/sec Left:  24 cm/sec Hepatic Artery Velocity:  23 cm/sec Splenic Vein Velocity:  17 cm/sec Spleen: 14.3 x 10.4 x 5.5 cm with calculated volume of 432 cc, previously, calculated volume of 590 cc, likely technique related. Varices: Nonvisualized Ascites: None visualized IMPRESSION: 1. Diffuse increased echogenicity of the hepatic parenchyma with mild nodularity of the hepatic contour as best seen in the setting of hepatic cirrhosis. 2. No discrete worrisome hepatic lesions. Further evaluation with abdominal MRI can be performed as indicated. 3. Patent hepatic vasculature with normal directional flow. 4. Borderline splenomegaly without evidence of ascites, similar to the 03/2019 examination. Electronically Signed    By: Sandi Mariscal M.D.   On: 04/13/2021 09:55    Assessment & Plan:   Problem List Items Addressed This Visit     Aortic valve stenosis    Mild by 2017 ECHO.   (radiates to left carotid) asymptomatic .  walks daily with SOB       GERD (gastroesophageal reflux disease)    PRESENT for over 5 years  ,  Managed with  with PPI bid.  New onset report of nocturnal cough noted by wife,  Recommending use of  head of bed elevation ,  Needs EGD with next colonoscopy in 2026.         Hypertension    Well controlled on current regimen of losartan 50 mg daily . Renal function stable, no changes today.  Lab Results  Component Value Date   CREATININE 1.10 10/04/2021   Lab Results  Component Value Date   NA 133 (L) 10/04/2021   K 4.4 10/04/2021   CL 98 10/04/2021   CO2 29 10/04/2021   Lab Results  Component Value Date   MICROALBUR <0.7 03/31/2021   MICROALBUR <0.7 08/21/2019           NAFLD (nonalcoholic fatty liver disease)    With cirrhosis by prior evaluation at [facility].  .   Annual AFP tumor marker is pending,   and liver doppler ultrasound has been done and is negative for masses.   Lab Results  Component Value Date   ALT 17 10/04/2021   AST 15 10/04/2021   ALKPHOS 52 10/04/2021   BILITOT 1.3 (H) 10/04/2021         Obesity, diabetes, and hypertension syndrome ([place]) - Primary    excellentcontrol on metformin and Januvia.   Continue ARB and statin.   Lab Results  Component Value Date   HGBA1C 6.7 (H) 10/04/2021   Lab Results  Component Value Date   MICROALBUR <0.7 03/31/2021   MICROALBUR <0.7 08/21/2019           Relevant Orders   Lipid Profile (Completed)   Direct LDL (Completed)   HgB A1c (Completed)   Comp Met (CMET) (Completed)   Other Visit Diagnoses     Cirrhosis of liver without ascites, unspecified hepatic cirrhosis type ([place])       Relevant Orders   AFP tumor marker       I spent a total of 30 minutes with this patient in a face to face visit  on the date of this encounter reviewing the last office visit with me 6 months ago,  most recent  colonoscopy,  patient'ss diet and eating habits,  home blood pressure readings ,  most recent imaging study of liver, ,   and post visit ordering of testing and therapeutics.    Follow-up: Return in about 6 months (around 04/06/2022).   Crecencio Mc, MD

## 2021-10-04 NOTE — Assessment & Plan Note (Signed)
PRESENT for over 5 years  ,  Managed with  with PPI bid.  New onset report of nocturnal cough noted by wife,  Recommending use of  head of bed elevation ,  Needs EGD with next colonoscopy in 2026.

## 2021-10-06 LAB — AFP TUMOR MARKER: AFP-Tumor Marker: 2.2 ng/mL (ref <6.1)

## 2021-11-08 ENCOUNTER — Encounter: Payer: Self-pay | Admitting: Internal Medicine

## 2021-11-23 ENCOUNTER — Other Ambulatory Visit: Payer: Self-pay | Admitting: Internal Medicine

## 2021-11-29 ENCOUNTER — Encounter: Payer: Self-pay | Admitting: Internal Medicine

## 2021-11-29 ENCOUNTER — Ambulatory Visit (INDEPENDENT_AMBULATORY_CARE_PROVIDER_SITE_OTHER): Payer: Medicare Other | Admitting: Internal Medicine

## 2021-11-29 VITALS — BP 150/86 | HR 80 | Temp 98.2°F | Ht 70.0 in | Wt 227.2 lb

## 2021-11-29 DIAGNOSIS — N529 Male erectile dysfunction, unspecified: Secondary | ICD-10-CM | POA: Insufficient documentation

## 2021-11-29 MED ORDER — SILDENAFIL CITRATE 20 MG PO TABS: 20.0000 mg | ORAL_TABLET | Freq: Three times a day (TID) | ORAL | 0 refills | Status: DC

## 2021-11-29 MED ORDER — TETANUS-DIPHTH-ACELL PERTUSSIS 5-2.5-18.5 LF-MCG/0.5 IM SUSY: 0.5000 mL | PREFILLED_SYRINGE | Freq: Once | INTRAMUSCULAR | 0 refills | Status: AC

## 2021-11-29 NOTE — Assessment & Plan Note (Signed)
Reviewed the risks and benefits of viagra.  He has no contraindications.  Sildenafil prescribed

## 2021-11-29 NOTE — Patient Instructions (Addendum)
I am  prescribing sildenafil which is generic for Viagra  Ignore the instructions on the bottle!  Take 2 tablets (40 mg total) before you need to have an erection   You can increase dose to 60 mg ( 3 tablets) if the 40 mg dose doesn't work  The maximal dose is 100 mg

## 2021-11-29 NOTE — Progress Notes (Signed)
Subjective:  Patient ID: Chad Gill, male    DOB: 1945-08-16  Age: 76 y.o. MRN: [id number]  CC: The encounter diagnosis was Erectile dysfunction, unspecified erectile dysfunction type.   HPI Chad Gill presents for  Chief Complaint  Patient presents with   Follow-up    Follow up to discuss ED concerns   76 YR OLD MALE WITH type 2 DM,  HTN and NASH presents with signs and sx of ED.  He states that he is on longer able to achieve and/or sustain an erection, and although his wife has been less interested in sex for the last several months (since her hip replacement),  he would like to be able to initiate intercourse).  He does have a semi erection in the morning  he has no history of glaucoma  or use of nitroglycerin    Outpatient Medications Prior to Visit  Medication Sig Dispense Refill   Ascorbic Acid (VITAMIN C PO) Take 500 mg by mouth.     atorvastatin (LIPITOR) 20 MG tablet TAKE 1 TABLET BY MOUTH EVERY DAY 90 tablet 3   glucose blood test strip Use to check blood sugars once daily 100 each 12   JANUVIA 50 MG tablet TAKE 1 TABLET BY MOUTH EVERY DAY 90 tablet 1   Lancets (ONETOUCH ULTRASOFT) lancets Use as instructed 100 each 12   losartan (COZAAR) 50 MG tablet TAKE 1 TABLET BY MOUTH EVERY DAY 90 tablet 0   metFORMIN (GLUCOPHAGE) 500 MG tablet TAKE 1 TABLET BY MOUTH TWICE A DAY WITH MEALS 180 tablet 3   Multiple Vitamins-Minerals (MULTIVITAMIN WITH MINERALS) tablet Take 1 tablet by mouth daily.     Multiple Vitamins-Minerals (PRESERVISION AREDS 2 PO) Take 2 tablets by mouth daily.     omeprazole (PRILOSEC) 20 MG capsule TAKE 1 CAPSULE BY MOUTH TWICE A DAY 180 capsule 1   Turmeric 400 MG CAPS Take 1 capsule by mouth daily.     Omega-3 Fatty Acids (FISH OIL) 1000 MG CAPS Take 1,000 mg by mouth daily. (Patient not taking: Reported on 11/29/2021)     No facility-administered medications prior to visit.    Review of Systems;  Patient denies headache, fevers, malaise,  unintentional weight loss, skin rash, eye pain, sinus congestion and sinus pain, sore throat, dysphagia,  hemoptysis , cough, dyspnea, wheezing, chest pain, palpitations, orthopnea, edema, abdominal pain, nausea, melena, diarrhea, constipation, flank pain, dysuria, hematuria, urinary  Frequency, nocturia, numbness, tingling, seizures,  Focal weakness, Loss of consciousness,  Tremor, insomnia, depression, anxiety, and suicidal ideation.      Objective:  BP (!) 150/86 (BP Location: Left Arm, Patient Position: Sitting, Cuff Size: Large)   Pulse 80   Temp 98.2 F (36.8 C) (Oral)   Ht '5\' 10"'$  (1.778 m)   Wt 227 lb 3.2 oz (103.1 kg)   SpO2 96%   BMI 32.60 kg/m   BP Readings from Last 3 Encounters:  11/29/21 (!) 150/86  10/04/21 132/88  03/31/21 128/80    Wt Readings from Last 3 Encounters:  11/29/21 227 lb 3.2 oz (103.1 kg)  10/04/21 225 lb (102.1 kg)  03/31/21 228 lb (103.4 kg)    General appearance: alert, cooperative and appears stated age Ears: normal TM's and external ear canals both ears Throat: lips, mucosa, and tongue normal; teeth and gums normal Neck: no adenopathy, no carotid bruit, supple, symmetrical, trachea midline and thyroid not enlarged, symmetric, no tenderness/mass/nodules Back: symmetric, no curvature. ROM normal. No CVA tenderness. Lungs: clear to  auscultation bilaterally Heart: regular rate and rhythm, S1, S2 normal, no murmur, click, rub or gallop Abdomen: soft, non-tender; bowel sounds normal; no masses,  no organomegaly Pulses: 2+ and symmetric Skin: Skin color, texture, turgor normal. No rashes or lesions Lymph nodes: Cervical, supraclavicular, and axillary nodes normal. Neuro:  awake and interactive with normal mood and affect. Higher cortical functions are normal. Speech is clear without word-finding difficulty or dysarthria. Extraocular movements are intact. Visual fields of both eyes are grossly intact. Sensation to light touch is grossly intact  bilaterally of upper and lower extremities. Motor examination shows 4+/5 symmetric hand grip and upper extremity and 5/5 lower extremity strength. There is no pronation or drift. Gait is non-ataxic   Lab Results  Component Value Date   HGBA1C 6.7 (H) 10/04/2021   HGBA1C 6.8 (H) 03/31/2021   HGBA1C 6.9 (H) 03/09/2020    Lab Results  Component Value Date   CREATININE 1.10 10/04/2021   CREATININE 1.18 03/31/2021   CREATININE 1.15 03/09/2020    Lab Results  Component Value Date   WBC 7.8 03/31/2021   HGB 14.4 03/31/2021   HCT 41.7 03/31/2021   PLT 209.0 03/31/2021   GLUCOSE 156 (H) 10/04/2021   CHOL 98 10/04/2021   TRIG 132.0 10/04/2021   HDL 35.00 (L) 10/04/2021   LDLDIRECT 50.0 10/04/2021   LDLCALC 37 10/04/2021   ALT 17 10/04/2021   AST 15 10/04/2021   NA 133 (L) 10/04/2021   K 4.4 10/04/2021   CL 98 10/04/2021   CREATININE 1.10 10/04/2021   BUN 11 10/04/2021   CO2 29 10/04/2021   PSA 1.35 03/13/2019   HGBA1C 6.7 (H) 10/04/2021   MICROALBUR <0.7 03/31/2021    US LIVER DOPPLER  Result Date: 04/13/2021 CLINICAL DATA:  History of non alcoholic cirrhosis. Evaluate hepatic vasculature. EXAM: DUPLEX ULTRASOUND OF LIVER TECHNIQUE: Color and duplex Doppler ultrasound was performed to evaluate the hepatic in-flow and out-flow vessels. COMPARISON:  03/26/2019; abdominal MRI 06/21/2017 FINDINGS: Liver: There is diffuse increased echogenicity of the hepatic parenchyma. This finding is associated with suspected mild nodularity of the hepatic contour (image 15). There is an approximately 1.9 x 1.8 x 1.6 cm minimally complex cyst within the left lobe of the liver as was characterized on abdominal MRI performed 06/21/2017 note is also made of a punctate (approximately 0.5 cm) hypoechoic lesion in the right lobe of the liver which is too small to accurately characterize though also favored to represent 1 of the hepatic cysts demonstrated on remote abdominal MRI. No discrete worrisome hepatic  lesions. Limited sonographic evaluation of the gallbladder is normal. No intrahepatic biliary ductal dilatation. Portal Vein Velocities (normal hepatopetal directional flow) Main:  31 cm/sec Right:  19 cm/sec Left:  23 cm/sec Hepatic Vein Velocities (normal hepatofugal direction of flow) Right:  17 cm/sec Middle:  32 cm/sec Left:  24 cm/sec Hepatic Artery Velocity:  23 cm/sec Splenic Vein Velocity:  17 cm/sec Spleen: 14.3 x 10.4 x 5.5 cm with calculated volume of 432 cc, previously, calculated volume of 590 cc, likely technique related. Varices: Nonvisualized Ascites: None visualized IMPRESSION: 1. Diffuse increased echogenicity of the hepatic parenchyma with mild nodularity of the hepatic contour as best seen in the setting of hepatic cirrhosis. 2. No discrete worrisome hepatic lesions. Further evaluation with abdominal MRI can be performed as indicated. 3. Patent hepatic vasculature with normal directional flow. 4. Borderline splenomegaly without evidence of ascites, similar to the 03/2019 examination. Electronically Signed   By: Eldridge Abrahams.D.  On: 04/13/2021 09:55    Assessment & Plan:   Problem List Items Addressed This Visit     Erectile dysfunction - Primary    Reviewed the risks and benefits of viagra.  He has no contraindications.  Sildenafil prescribed       \  Follow-up: Return in about 4 months (around 04/01/2022).   Crecencio Mc, MD

## 2021-11-30 ENCOUNTER — Encounter: Payer: Self-pay | Admitting: Internal Medicine

## 2021-12-06 ENCOUNTER — Other Ambulatory Visit: Payer: Self-pay | Admitting: Internal Medicine

## 2021-12-06 ENCOUNTER — Telehealth: Payer: Self-pay

## 2021-12-06 DIAGNOSIS — I10 Essential (primary) hypertension: Secondary | ICD-10-CM

## 2021-12-06 NOTE — Telephone Encounter (Signed)
PA for Sildenafil has been submitted on covermymeds.  °

## 2021-12-12 NOTE — Telephone Encounter (Signed)
PA has been denied by insurance.  

## 2022-01-26 ENCOUNTER — Other Ambulatory Visit: Payer: Self-pay | Admitting: Internal Medicine

## 2022-03-03 ENCOUNTER — Other Ambulatory Visit: Payer: Self-pay | Admitting: Internal Medicine

## 2022-03-03 DIAGNOSIS — I10 Essential (primary) hypertension: Secondary | ICD-10-CM

## 2022-04-06 ENCOUNTER — Ambulatory Visit: Payer: Medicare Other | Admitting: Internal Medicine

## 2022-04-18 ENCOUNTER — Ambulatory Visit (INDEPENDENT_AMBULATORY_CARE_PROVIDER_SITE_OTHER): Payer: Medicare Other | Admitting: Internal Medicine

## 2022-04-18 ENCOUNTER — Encounter: Payer: Self-pay | Admitting: Internal Medicine

## 2022-04-18 VITALS — BP 136/74 | HR 75 | Temp 97.9°F | Ht 70.0 in | Wt 227.0 lb

## 2022-04-18 DIAGNOSIS — I10 Essential (primary) hypertension: Secondary | ICD-10-CM

## 2022-04-18 DIAGNOSIS — R5383 Other fatigue: Secondary | ICD-10-CM

## 2022-04-18 DIAGNOSIS — E1169 Type 2 diabetes mellitus with other specified complication: Secondary | ICD-10-CM | POA: Diagnosis not present

## 2022-04-18 DIAGNOSIS — E669 Obesity, unspecified: Secondary | ICD-10-CM | POA: Diagnosis not present

## 2022-04-18 DIAGNOSIS — I350 Nonrheumatic aortic (valve) stenosis: Secondary | ICD-10-CM | POA: Diagnosis not present

## 2022-04-18 DIAGNOSIS — I152 Hypertension secondary to endocrine disorders: Secondary | ICD-10-CM | POA: Diagnosis not present

## 2022-04-18 DIAGNOSIS — E785 Hyperlipidemia, unspecified: Secondary | ICD-10-CM

## 2022-04-18 DIAGNOSIS — E1159 Type 2 diabetes mellitus with other circulatory complications: Secondary | ICD-10-CM | POA: Diagnosis not present

## 2022-04-18 LAB — GLUCOSE, POCT (MANUAL RESULT ENTRY): POC Glucose: 183 mg/dl — AB (ref 70–99)

## 2022-04-18 MED ORDER — LOSARTAN POTASSIUM 50 MG PO TABS: 50.0000 mg | ORAL_TABLET | Freq: Every day | ORAL | 1 refills | Status: DC

## 2022-04-18 MED ORDER — SITAGLIPTIN PHOSPHATE 50 MG PO TABS: 50.0000 mg | ORAL_TABLET | Freq: Every day | ORAL | 1 refills | Status: DC

## 2022-04-18 MED ORDER — ATORVASTATIN CALCIUM 20 MG PO TABS: 20.0000 mg | ORAL_TABLET | Freq: Every day | ORAL | 3 refills | Status: DC

## 2022-04-18 MED ORDER — OMEPRAZOLE 20 MG PO CPDR: 20.0000 mg | DELAYED_RELEASE_CAPSULE | Freq: Two times a day (BID) | ORAL | 1 refills | Status: DC

## 2022-04-18 NOTE — Assessment & Plan Note (Signed)
Mild by 2017 ECHO.   (radiates to left carotid) asymptomatic .  walks daily FOR 30 MINUTES WITHOUT DYSPNEA.

## 2022-04-18 NOTE — Progress Notes (Signed)
Subjective:  Patient ID: Chad Gill, male    DOB: [birth year]  Age: 77 y.o. MRN: [id number]  CC: The primary encounter diagnosis was Obesity, diabetes, and hypertension syndrome ([place]). Diagnoses of Hyperlipidemia associated with type 2 diabetes mellitus ([place]), Essential hypertension, and Other fatigue were also pertinent to this visit.   HPI Chad Gill presents for  Chief Complaint  Patient presents with   Medical Management of Chronic Issues    6 month follow up    1) TYPE 2 DM:   He feels generally well, is walking  several times per week and checking blood sugars once daily at variable times.  BS have been under 130 fasting and < 150 post prandially.  Had a fasting hypoglycemic event cbg of 51 yesterday without symptoms.    Occurred laste afternoon  Taking his medications as directed. Following a carbohydrate modified diet 6 days per week. Denies numbness, burning and tingling of extremities. Appetite is good.       Outpatient Medications Prior to Visit  Medication Sig Dispense Refill   Ascorbic Acid (VITAMIN C PO) Take 500 mg by mouth.     glucose blood test strip Use to check blood sugars once daily 100 each 12   Lancets (ONETOUCH ULTRASOFT) lancets Use as instructed 100 each 12   metFORMIN (GLUCOPHAGE) 500 MG tablet TAKE 1 TABLET BY MOUTH TWICE A DAY WITH MEALS 180 tablet 3   Multiple Vitamins-Minerals (PRESERVISION AREDS 2 PO) Take 2 tablets by mouth daily.     sildenafil (REVATIO) 20 MG tablet TAKE 1 TABLET BY MOUTH THREE TIMES DAILY 90 tablet 0   Turmeric 400 MG CAPS Take 1 capsule by mouth daily.     atorvastatin (LIPITOR) 20 MG tablet TAKE 1 TABLET BY MOUTH EVERY DAY 90 tablet 3   JANUVIA 50 MG tablet TAKE 1 TABLET BY MOUTH EVERY DAY 90 tablet 1   losartan (COZAAR) 50 MG tablet TAKE 1 TABLET BY MOUTH EVERY DAY 90 tablet 0   Multiple Vitamins-Minerals (MULTIVITAMIN WITH MINERALS) tablet Take 1 tablet by mouth daily.     omeprazole (PRILOSEC) 20 MG capsule TAKE 1  CAPSULE BY MOUTH TWICE A DAY 180 capsule 1   No facility-administered medications prior to visit.    Review of Systems;  Patient denies headache, fevers, malaise, unintentional weight loss, skin rash, eye pain, sinus congestion and sinus pain, sore throat, dysphagia,  hemoptysis , cough, dyspnea, wheezing, chest pain, palpitations, orthopnea, edema, abdominal pain, nausea, melena, diarrhea, constipation, flank pain, dysuria, hematuria, urinary  Frequency, nocturia, numbness, tingling, seizures,  Focal weakness, Loss of consciousness,  Tremor, insomnia, depression, anxiety, and suicidal ideation.      Objective:  BP 136/74   Pulse 75   Temp 97.9 F (36.6 C) (Oral)   Ht '5\' 10"'$  (1.778 m)   Wt 227 lb (103 kg)   SpO2 96%   BMI 32.57 kg/m   BP Readings from Last 3 Encounters:  04/18/22 136/74  11/29/21 (!) 150/86  10/04/21 132/88    Wt Readings from Last 3 Encounters:  04/18/22 227 lb (103 kg)  11/29/21 227 lb 3.2 oz (103.1 kg)  10/04/21 225 lb (102.1 kg)    Physical Exam  Lab Results  Component Value Date   HGBA1C 6.7 (H) 10/04/2021   HGBA1C 6.8 (H) 03/31/2021   HGBA1C 6.9 (H) 03/09/2020    Lab Results  Component Value Date   CREATININE 1.10 10/04/2021   CREATININE 1.18 03/31/2021   CREATININE 1.15 03/09/2020  Lab Results  Component Value Date   WBC 7.8 03/31/2021   HGB 14.4 03/31/2021   HCT 41.7 03/31/2021   PLT 209.0 03/31/2021   GLUCOSE 156 (H) 10/04/2021   CHOL 98 10/04/2021   TRIG 132.0 10/04/2021   HDL 35.00 (L) 10/04/2021   LDLDIRECT 50.0 10/04/2021   LDLCALC 37 10/04/2021   ALT 17 10/04/2021   AST 15 10/04/2021   NA 133 (L) 10/04/2021   K 4.4 10/04/2021   CL 98 10/04/2021   CREATININE 1.10 10/04/2021   BUN 11 10/04/2021   CO2 29 10/04/2021   PSA 1.35 03/13/2019   HGBA1C 6.7 (H) 10/04/2021   MICROALBUR <0.7 03/31/2021    US LIVER DOPPLER  Result Date: 04/13/2021 CLINICAL DATA:  History of non alcoholic cirrhosis. Evaluate hepatic  vasculature. EXAM: DUPLEX ULTRASOUND OF LIVER TECHNIQUE: Color and duplex Doppler ultrasound was performed to evaluate the hepatic in-flow and out-flow vessels. COMPARISON:  03/26/2019; abdominal MRI 06/21/2017 FINDINGS: Liver: There is diffuse increased echogenicity of the hepatic parenchyma. This finding is associated with suspected mild nodularity of the hepatic contour (image 15). There is an approximately 1.9 x 1.8 x 1.6 cm minimally complex cyst within the left lobe of the liver as was characterized on abdominal MRI performed 06/21/2017 note is also made of a punctate (approximately 0.5 cm) hypoechoic lesion in the right lobe of the liver which is too small to accurately characterize though also favored to represent 1 of the hepatic cysts demonstrated on remote abdominal MRI. No discrete worrisome hepatic lesions. Limited sonographic evaluation of the gallbladder is normal. No intrahepatic biliary ductal dilatation. Portal Vein Velocities (normal hepatopetal directional flow) Main:  31 cm/sec Right:  19 cm/sec Left:  23 cm/sec Hepatic Vein Velocities (normal hepatofugal direction of flow) Right:  17 cm/sec Middle:  32 cm/sec Left:  24 cm/sec Hepatic Artery Velocity:  23 cm/sec Splenic Vein Velocity:  17 cm/sec Spleen: 14.3 x 10.4 x 5.5 cm with calculated volume of 432 cc, previously, calculated volume of 590 cc, likely technique related. Varices: Nonvisualized Ascites: None visualized IMPRESSION: 1. Diffuse increased echogenicity of the hepatic parenchyma with mild nodularity of the hepatic contour as best seen in the setting of hepatic cirrhosis. 2. No discrete worrisome hepatic lesions. Further evaluation with abdominal MRI can be performed as indicated. 3. Patent hepatic vasculature with normal directional flow. 4. Borderline splenomegaly without evidence of ascites, similar to the 03/2019 examination. Electronically Signed   By: Sandi Mariscal M.D.   On: 04/13/2021 09:55    Assessment & Plan:  .Obesity,  diabetes, and hypertension syndrome ([facility]) -     Microalbumin / creatinine urine ratio -     Hemoglobin A1c -     Comprehensive metabolic panel  Hyperlipidemia associated with type 2 diabetes mellitus ([facility]) -     Lipid panel -     LDL cholesterol, direct  Essential hypertension -     Losartan Potassium; Take 1 tablet (50 mg total) by mouth daily.  Dispense: 90 tablet; Refill: 1  Other fatigue -     CBC with Differential/Platelet -     TSH  Other orders -     Atorvastatin Calcium; Take 1 tablet (20 mg total) by mouth daily.  Dispense: 90 tablet; Refill: 3 -     SITagliptin Phosphate; Take 1 tablet (50 mg total) by mouth daily.  Dispense: 90 tablet; Refill: 1 -     Omeprazole; Take 1 capsule (20 mg total) by mouth 2 (two) times daily.  Dispense: 180 capsule; Refill: 1     I provided 30 minutes of face-to-face time during this encounter reviewing patient's last visit with me, patient's  most recent visit with cardiology,  nephrology,  and neurology,  recent surgical and non surgical procedures, previous  labs and imaging studies, counseling on currently addressed issues,  and post visit ordering to diagnostics and therapeutics .   Follow-up: No follow-ups on file.   Crecencio Mc, MD

## 2022-04-18 NOTE — Patient Instructions (Addendum)
Your Medicare Annual Wellness visit is due, please schedule this appointment at checkout.    I strongly recommend that you get the TDaP vaccine  as you are overdue   It will be FREE AT [place] NOT DROP BELOW 80.  IF THEY CONTINUE TO DROP , PLEASE LET ME KNOW

## 2022-04-19 ENCOUNTER — Other Ambulatory Visit: Payer: Self-pay | Admitting: Internal Medicine

## 2022-04-19 LAB — LIPID PANEL
Cholesterol: 117 mg/dL (ref 0–200)
HDL: 35.6 mg/dL — ABNORMAL LOW (ref >39.00)
LDL Cholesterol: 49 mg/dL (ref 0–99)
NonHDL: 81.45
Total CHOL/HDL Ratio: 3
Triglycerides: 163 mg/dL — ABNORMAL HIGH (ref 0.0–149.0)
VLDL: 32.6 mg/dL (ref 0.0–40.0)

## 2022-04-19 LAB — CBC WITH DIFFERENTIAL/PLATELET
Basophils Absolute: 0.1 10*3/uL (ref 0.0–0.1)
Basophils Relative: 1 % (ref 0.0–3.0)
Eosinophils Absolute: 0.2 10*3/uL (ref 0.0–0.7)
Eosinophils Relative: 2.9 % (ref 0.0–5.0)
HCT: 42.6 % (ref 39.0–52.0)
Hemoglobin: 15 g/dL (ref 13.0–17.0)
Lymphocytes Relative: 15 % (ref 12.0–46.0)
Lymphs Abs: 1.3 10*3/uL (ref 0.7–4.0)
MCHC: 35.3 g/dL (ref 30.0–36.0)
MCV: 86.1 fl (ref 78.0–100.0)
Monocytes Absolute: 0.6 10*3/uL (ref 0.1–1.0)
Monocytes Relative: 6.9 % (ref 3.0–12.0)
Neutro Abs: 6.3 10*3/uL (ref 1.4–7.7)
Neutrophils Relative %: 74.2 % (ref 43.0–77.0)
Platelets: 243 10*3/uL (ref 150.0–400.0)
RBC: 4.94 Mil/uL (ref 4.22–5.81)
RDW: 14 % (ref 11.5–15.5)
WBC: 8.5 10*3/uL (ref 4.0–10.5)

## 2022-04-19 LAB — COMPREHENSIVE METABOLIC PANEL
ALT: 23 U/L (ref 0–53)
AST: 17 U/L (ref 0–37)
Albumin: 4.3 g/dL (ref 3.5–5.2)
Alkaline Phosphatase: 52 U/L (ref 39–117)
BUN: 11 mg/dL (ref 6–23)
CO2: 26 mEq/L (ref 19–32)
Calcium: 9.6 mg/dL (ref 8.4–10.5)
Chloride: 98 mEq/L (ref 96–112)
Creatinine, Ser: 1.15 mg/dL (ref 0.40–1.50)
GFR: 61.5 mL/min (ref >60.00)
Glucose, Bld: 171 mg/dL — ABNORMAL HIGH (ref 70–99)
Potassium: 4.4 mEq/L (ref 3.5–5.1)
Sodium: 135 mEq/L (ref 135–145)
Total Bilirubin: 1.2 mg/dL (ref 0.2–1.2)
Total Protein: 6.6 g/dL (ref 6.0–8.3)

## 2022-04-19 LAB — HEMOGLOBIN A1C: Hgb A1c MFr Bld: 7 % — ABNORMAL HIGH (ref 4.6–6.5)

## 2022-04-19 LAB — TSH: TSH: 1.89 u[IU]/mL (ref 0.35–5.50)

## 2022-04-19 LAB — LDL CHOLESTEROL, DIRECT: Direct LDL: 61 mg/dL

## 2022-04-19 NOTE — Assessment & Plan Note (Signed)
Excellent control on metformin and Januvia.   Continue ARB and statin. Home glucometer was likely incorrect in reading of 51 yesterday given CBF reading of 180 today after a 12 hour fast.  LDL is at goal on atorvasatin 20 mg .  Continue losartan 50 mg f or B P management   Lab Results  Component Value Date   HGBA1C 7.0 (H) 04/18/2022   Lab Results  Component Value Date   MICROALBUR <0.7 03/31/2021   MICROALBUR <0.7 08/21/2019   Lab Results  Component Value Date   CHOL 117 04/18/2022   HDL 35.60 (L) 04/18/2022   LDLCALC 49 04/18/2022   LDLDIRECT 61.0 04/18/2022   TRIG 163.0 (H) 04/18/2022   CHOLHDL 3 04/18/2022

## 2022-04-20 MED ORDER — SILDENAFIL CITRATE 20 MG PO TABS: 20.0000 mg | ORAL_TABLET | Freq: Three times a day (TID) | ORAL | 0 refills | Status: DC

## 2022-05-17 ENCOUNTER — Other Ambulatory Visit: Payer: Self-pay | Admitting: Internal Medicine

## 2022-06-07 LAB — HM DIABETES EYE EXAM

## 2022-06-14 ENCOUNTER — Other Ambulatory Visit: Payer: Self-pay | Admitting: Internal Medicine

## 2022-07-10 ENCOUNTER — Other Ambulatory Visit: Payer: Self-pay | Admitting: Internal Medicine

## 2022-07-18 ENCOUNTER — Ambulatory Visit (INDEPENDENT_AMBULATORY_CARE_PROVIDER_SITE_OTHER): Payer: Medicare Other | Admitting: Family

## 2022-07-18 ENCOUNTER — Encounter: Payer: Self-pay | Admitting: Family

## 2022-07-18 VITALS — BP 120/76 | HR 82 | Temp 98.1°F | Ht 70.0 in | Wt 224.4 lb

## 2022-07-18 DIAGNOSIS — J40 Bronchitis, not specified as acute or chronic: Secondary | ICD-10-CM | POA: Diagnosis not present

## 2022-07-18 MED ORDER — DEXTROMETHORPHAN-GUAIFENESIN 5-100 MG/5ML PO LIQD: 10.0000 mL | Freq: Every evening | ORAL | 0 refills | Status: DC | PRN

## 2022-07-18 MED ORDER — AMOXICILLIN-POT CLAVULANATE 875-125 MG PO TABS: 1.0000 | ORAL_TABLET | Freq: Two times a day (BID) | ORAL | 0 refills | Status: AC

## 2022-07-18 NOTE — Progress Notes (Signed)
Assessment & Plan:  Bronchitis Assessment & Plan: Reassuring exam. Afebrile. Duration 4 weeks and discussed likely to bacterial URI at this time. Start Mucinex DM, Augmentin, probiotics.  Patient declines chest x-ray today which I think is reasonable in the absence of adventitious lung sounds.  Advised to pursue chest x-ray if cough does not resolve with antibiotic.  Orders: -     Amoxicillin-Pot Clavulanate; Take 1 tablet by mouth 2 (two) times daily for 7 days.  Dispense: 14 tablet; Refill: 0 -     Dextromethorphan-guaiFENesin; Take 10 mLs by mouth at bedtime as needed (for cough).  Dispense: 40 mL; Refill: 0     Return precautions given.   Risks, benefits, and alternatives of the medications and treatment plan prescribed today were discussed, and patient expressed understanding.   Education regarding symptom management and diagnosis given to patient on AVS either electronically or printed.  No follow-ups on file.  Rennie Plowman, FNP  Subjective:    Patient ID: Chad Gill, male    DOB: 29-May-1945, 77 y.o.   MRN: [id number]  CC: FAVOUR COTA is a 77 y.o. male who presents today for an acute visit.    HPI: Accompanied by wife  Complains of productive cough x one month, sporadic  Not worse with activity . Occasional sneezing.   No fever, sob, leg swelling, epigastric burning, belching, PND, ear pain, sore throat, orthopnea.   Tried zrytec and cough OTC suppressant without relief     H/o DM, HTN, GERD Former smoker, quit 1987  No h/o ckd, CHF  Allergies: Other and Influenza vac split [influenza virus vaccine] Current Outpatient Medications on File Prior to Visit  Medication Sig Dispense Refill   Ascorbic Acid (VITAMIN C PO) Take 500 mg by mouth.     atorvastatin (LIPITOR) 20 MG tablet Take 1 tablet (20 mg total) by mouth daily. 90 tablet 3   glucose blood test strip Use to check blood sugars once daily 100 each 12   Lancets (ONETOUCH ULTRASOFT) lancets  Use as instructed 100 each 12   losartan (COZAAR) 50 MG tablet Take 1 tablet (50 mg total) by mouth daily. 90 tablet 1   metFORMIN (GLUCOPHAGE) 500 MG tablet TAKE 1 TABLET BY MOUTH TWICE A DAY WITH MEALS 180 tablet 3   Multiple Vitamins-Minerals (PRESERVISION AREDS 2 PO) Take 2 tablets by mouth daily.     omeprazole (PRILOSEC) 20 MG capsule Take 1 capsule (20 mg total) by mouth 2 (two) times daily. 180 capsule 1   sildenafil (REVATIO) 20 MG tablet TAKE 1 TABLET BY MOUTH THREE TIMES DAILY 90 tablet 0   sitaGLIPtin (JANUVIA) 50 MG tablet Take 1 tablet (50 mg total) by mouth daily. 90 tablet 1   Turmeric 400 MG CAPS Take 1 capsule by mouth daily.     No current facility-administered medications on file prior to visit.    Review of Systems  Constitutional:  Negative for chills and fever.  HENT:  Positive for congestion. Negative for postnasal drip, rhinorrhea, sinus pressure and sore throat.   Respiratory:  Positive for cough. Negative for shortness of breath and wheezing.   Cardiovascular:  Negative for chest pain and palpitations.  Gastrointestinal:  Negative for abdominal pain, nausea and vomiting.      Objective:    BP 120/76   Pulse 82   Temp 98.1 F (36.7 C)   Ht 5\' 10"  (1.778 m)   Wt 224 lb 6.4 oz (101.8 kg)   SpO2 95%  BMI 32.20 kg/m   BP Readings from Last 3 Encounters:  07/18/22 120/76  04/18/22 136/74  11/29/21 (!) 150/86   Wt Readings from Last 3 Encounters:  07/18/22 224 lb 6.4 oz (101.8 kg)  04/18/22 227 lb (103 kg)  11/29/21 227 lb 3.2 oz (103.1 kg)    Physical Exam Vitals reviewed.  Constitutional:      Appearance: He is well-developed.  HENT:     Head: Normocephalic and atraumatic.     Right Ear: Hearing, tympanic membrane, ear canal and external ear normal. No decreased hearing noted. No drainage, swelling or tenderness. No middle ear effusion. Tympanic membrane is not injected, erythematous or bulging.     Left Ear: Hearing, tympanic membrane, ear  canal and external ear normal. No decreased hearing noted. No drainage, swelling or tenderness.  No middle ear effusion. Tympanic membrane is not injected, erythematous or bulging.     Nose: Nose normal.     Right Sinus: No maxillary sinus tenderness or frontal sinus tenderness.     Left Sinus: No maxillary sinus tenderness or frontal sinus tenderness.     Mouth/Throat:     Pharynx: Uvula midline. No oropharyngeal exudate or posterior oropharyngeal erythema.     Tonsils: No tonsillar abscesses.  Eyes:     Conjunctiva/sclera: Conjunctivae normal.  Cardiovascular:     Rate and Rhythm: Regular rhythm.     Heart sounds: Normal heart sounds.  Pulmonary:     Effort: Pulmonary effort is normal. No respiratory distress.     Breath sounds: Normal breath sounds. No wheezing, rhonchi or rales.  Lymphadenopathy:     Head:     Right side of head: No submental, submandibular, tonsillar, preauricular, posterior auricular or occipital adenopathy.     Left side of head: No submental, submandibular, tonsillar, preauricular, posterior auricular or occipital adenopathy.     Cervical: No cervical adenopathy.  Skin:    General: Skin is warm and dry.  Neurological:     Mental Status: He is alert.  Psychiatric:        Speech: Speech normal.        Behavior: Behavior normal.

## 2022-07-18 NOTE — Patient Instructions (Signed)
Start Mucinex DM to break up congestion, augmentin ( antibiotic)   Ensure to take probiotics while on antibiotics and also for 2 weeks after completion. This can either be by eating yogurt daily or taking a probiotic supplement over the counter such as Culturelle.It is important to re-colonize the gut with good bacteria and also to prevent any diarrheal infections associated with antibiotic use.   Please let me know if no improvement

## 2022-07-18 NOTE — Assessment & Plan Note (Signed)
Reassuring exam. Afebrile. Duration 4 weeks and discussed likely to bacterial URI at this time. Start Mucinex DM, Augmentin, probiotics.  Patient declines chest x-ray today which I think is reasonable in the absence of adventitious lung sounds.  Advised to pursue chest x-ray if cough does not resolve with antibiotic.

## 2022-07-23 ENCOUNTER — Other Ambulatory Visit: Payer: Self-pay | Admitting: Family

## 2022-07-23 ENCOUNTER — Telehealth: Payer: Self-pay | Admitting: Internal Medicine

## 2022-07-23 DIAGNOSIS — J40 Bronchitis, not specified as acute or chronic: Secondary | ICD-10-CM

## 2022-07-23 NOTE — Telephone Encounter (Signed)
Call pt Sch CXR  Please sch f/u appt with me or another colleague to follow up on cough

## 2022-07-23 NOTE — Telephone Encounter (Signed)
Spoke to pt and scheduled appt for 07/24/22 can you put in orders for xray

## 2022-07-23 NOTE — Telephone Encounter (Signed)
Spoke to pt he will schedule f/up appt wen he comes in for Cxray

## 2022-07-23 NOTE — Telephone Encounter (Signed)
Pt wife called in stating that pt last time saw Arnett, and as per Arnett she mentioned if pt dos not get better she was going to do x-ray for pt. She called to f/u on that. Theres not orders in for x-ray.

## 2022-07-24 ENCOUNTER — Ambulatory Visit (INDEPENDENT_AMBULATORY_CARE_PROVIDER_SITE_OTHER): Payer: Medicare Other

## 2022-07-24 ENCOUNTER — Other Ambulatory Visit: Payer: Medicare Other

## 2022-07-24 DIAGNOSIS — J40 Bronchitis, not specified as acute or chronic: Secondary | ICD-10-CM | POA: Diagnosis not present

## 2022-07-25 ENCOUNTER — Encounter: Payer: Self-pay | Admitting: Family

## 2022-08-29 ENCOUNTER — Encounter: Payer: Self-pay | Admitting: Internal Medicine

## 2022-08-29 ENCOUNTER — Telehealth: Payer: Self-pay | Admitting: Internal Medicine

## 2022-08-29 NOTE — Telephone Encounter (Signed)
Pt called stating he received a summons letter for jury duty in the mail and pt would like to get a letter to be excused from it due to his disability

## 2022-09-21 ENCOUNTER — Telehealth: Payer: Self-pay | Admitting: Internal Medicine

## 2022-09-21 NOTE — Telephone Encounter (Signed)
Copied from CRM [id number]. Topic: Medicare AWV >> Sep 21, 2022  2:35 PM Payton Doughty wrote: Reason for CRM: LM 09/21/2022 to schedule AWV   Verlee Rossetti; Care Guide Ambulatory Clinical Support [place] [facility] Direct Dial: [phone]

## 2022-10-04 ENCOUNTER — Encounter (INDEPENDENT_AMBULATORY_CARE_PROVIDER_SITE_OTHER): Payer: Self-pay

## 2022-10-06 ENCOUNTER — Other Ambulatory Visit: Payer: Self-pay | Admitting: Internal Medicine

## 2022-10-13 ENCOUNTER — Other Ambulatory Visit: Payer: Self-pay | Admitting: Internal Medicine

## 2022-10-13 DIAGNOSIS — I10 Essential (primary) hypertension: Secondary | ICD-10-CM

## 2022-10-17 ENCOUNTER — Ambulatory Visit: Payer: Medicare Other | Admitting: Internal Medicine

## 2022-11-06 ENCOUNTER — Encounter: Payer: Self-pay | Admitting: Internal Medicine

## 2022-11-06 ENCOUNTER — Telehealth: Payer: Self-pay | Admitting: Internal Medicine

## 2022-11-06 ENCOUNTER — Ambulatory Visit (INDEPENDENT_AMBULATORY_CARE_PROVIDER_SITE_OTHER): Payer: Medicare Other | Admitting: Internal Medicine

## 2022-11-06 VITALS — BP 162/80 | HR 85 | Temp 98.4°F | Ht 70.0 in | Wt 229.0 lb

## 2022-11-06 DIAGNOSIS — E1169 Type 2 diabetes mellitus with other specified complication: Secondary | ICD-10-CM

## 2022-11-06 DIAGNOSIS — Z7984 Long term (current) use of oral hypoglycemic drugs: Secondary | ICD-10-CM | POA: Diagnosis not present

## 2022-11-06 DIAGNOSIS — J40 Bronchitis, not specified as acute or chronic: Secondary | ICD-10-CM

## 2022-11-06 DIAGNOSIS — I152 Hypertension secondary to endocrine disorders: Secondary | ICD-10-CM

## 2022-11-06 DIAGNOSIS — I10 Essential (primary) hypertension: Secondary | ICD-10-CM

## 2022-11-06 DIAGNOSIS — K7581 Nonalcoholic steatohepatitis (NASH): Secondary | ICD-10-CM

## 2022-11-06 DIAGNOSIS — E1159 Type 2 diabetes mellitus with other circulatory complications: Secondary | ICD-10-CM

## 2022-11-06 DIAGNOSIS — E669 Obesity, unspecified: Secondary | ICD-10-CM | POA: Diagnosis not present

## 2022-11-06 DIAGNOSIS — K7460 Unspecified cirrhosis of liver: Secondary | ICD-10-CM

## 2022-11-06 DIAGNOSIS — E785 Hyperlipidemia, unspecified: Secondary | ICD-10-CM | POA: Diagnosis not present

## 2022-11-06 DIAGNOSIS — K760 Fatty (change of) liver, not elsewhere classified: Secondary | ICD-10-CM

## 2022-11-06 DIAGNOSIS — I350 Nonrheumatic aortic (valve) stenosis: Secondary | ICD-10-CM

## 2022-11-06 DIAGNOSIS — I679 Cerebrovascular disease, unspecified: Secondary | ICD-10-CM

## 2022-11-06 MED ORDER — METFORMIN HCL 500 MG PO TABS: 500.0000 mg | ORAL_TABLET | Freq: Two times a day (BID) | ORAL | 3 refills | Status: DC

## 2022-11-06 MED ORDER — ASPIRIN 81 MG PO TBEC: 81.0000 mg | DELAYED_RELEASE_TABLET | Freq: Every day | ORAL | Status: AC

## 2022-11-06 MED ORDER — SILDENAFIL CITRATE 20 MG PO TABS: 20.0000 mg | ORAL_TABLET | Freq: Three times a day (TID) | ORAL | 0 refills | Status: DC

## 2022-11-06 MED ORDER — OMEPRAZOLE 20 MG PO CPDR: 20.0000 mg | DELAYED_RELEASE_CAPSULE | Freq: Two times a day (BID) | ORAL | 1 refills | Status: DC

## 2022-11-06 MED ORDER — FLUTICASONE PROPIONATE 50 MCG/ACT NA SUSP: 2.0000 | Freq: Every day | NASAL | 6 refills | Status: DC

## 2022-11-06 MED ORDER — LOSARTAN POTASSIUM 100 MG PO TABS
100.0000 mg | ORAL_TABLET | Freq: Every day | ORAL | 1 refills | Status: DC
Start: 2022-11-06 — End: 2022-12-24

## 2022-11-06 NOTE — Assessment & Plan Note (Signed)
Slight loss of  control on metformin and Januvia.  Will recommend additional of ozempic .   Increasing losartan to 100 mg daily,  continue statin.   LDL is at goal on atorvasatin 20 mg .   Lab Results  Component Value Date   HGBA1C 7.4 (H) 11/06/2022   Lab Results  Component Value Date   MICROALBUR <0.7 11/06/2022   MICROALBUR <0.7 03/31/2021   Lab Results  Component Value Date   CHOL 115 11/06/2022   HDL 33.50 (L) 11/06/2022   LDLCALC 33 11/06/2022   LDLDIRECT 60.0 11/06/2022   TRIG 241.0 (H) 11/06/2022   CHOLHDL 3 11/06/2022

## 2022-11-06 NOTE — Patient Instructions (Addendum)
Your cough may be due to allergies,  since your chest x ray was normal I June.  I would like you to try using flonase  EVERY DAY for a few weeks to see if it resolves  Please increase your losartan to 100 mg daily to lower your blood pressure,  and return in 1 month for  BP check   PLEASE START TAKING A BABY ASPIRIN DAILY    It's time to repeat your liver ultrasound AND YOUR HEART ULTRASOUND (ECHOCARDIOGRAM) TO CHECK UP ON  1) CIRRHOSIS  2) AORTIC STENOSIS   Pleas get back to walking every day for exercise.  30 minutes daily is your goal

## 2022-11-06 NOTE — Assessment & Plan Note (Signed)
INCREASE LOSARTAN TO 100 MG DAILY

## 2022-11-06 NOTE — Telephone Encounter (Signed)
Lft pt vm to call ofc to sch [place]. thanks ?

## 2022-11-06 NOTE — Assessment & Plan Note (Signed)
He was treated in late May with abx.  Reutrned for persistent cough in June;  chest x ray normal.  Trial of flonase

## 2022-11-06 NOTE — Progress Notes (Signed)
Subjective:  Patient ID: Chad Gill, male    DOB: 08-Apr-1945  Age: 77 y.o. MRN: [id number]  CC: The primary encounter diagnosis was Obesity, diabetes, and hypertension syndrome (HCC). Diagnoses of Hyperlipidemia associated with type 2 diabetes mellitus (HCC), NAFLD (nonalcoholic fatty liver disease), Liver cirrhosis secondary to NASH [facility]), Aortic valve stenosis, etiology of cardiac valve disease unspecified, Essential hypertension, Primary hypertension, Cerebrovascular disease, unspecified, and Bronchitis were also pertinent to this visit.   HPI Chad Gill presents for  Chief Complaint  Patient presents with   Medical Management of Chronic Issues    6 month follow up    1) type 2 DM w/obesity/hypertension:   taking januvia and metformin for diabetes control .  Taking losartan  50 mg and atorvastatin 20 mg  . Checking BS  daily or less   random 145 recently.  Has stopped walking daily due to hot weather and has gained 4-5 lb since February visit.  Reviewed diet: eats 2 starches for breakfast,  no lunch, and 1-2 starches with dinner.    Lab Results  Component Value Date   HGBA1C 7.0 (H) 04/18/2022   2) Persistent intermittent cough for the past several months ,  productive of yellow sputum occurring when upright, not occurring during sleep .  Did not occur after a cold.  Saw Margaret in June chest x ray done  normal.   3) scalp laceration in August during a mechanical fall in parking lot (tripped over cement divider).     no concussion,  healed fast.  Treated at [facility] ,  stitches placed and removed. CT brain, facial bones and c spine done. Frontal hematoma .  No other cute changes   4) moderate small vessel ischemic changes on brain CT recommended adding baby aspirin daily   5) Cirrhosis secondary to MASH: due for annual surveillance US/ and AFP  6) Aortic stenosis: asymptomatic , but hasn't exercised in several months     Outpatient Medications Prior to Visit   Medication Sig Dispense Refill   Ascorbic Acid (VITAMIN C PO) Take 500 mg by mouth.     atorvastatin (LIPITOR) 20 MG tablet Take 1 tablet (20 mg total) by mouth daily. 90 tablet 3   glucose blood test strip Use to check blood sugars once daily 100 each 12   JANUVIA 50 MG tablet TAKE 1 TABLET BY MOUTH EVERY DAY 90 tablet 1   Lancets (ONETOUCH ULTRASOFT) lancets Use as instructed 100 each 12   Multiple Vitamins-Minerals (PRESERVISION AREDS 2 PO) Take 2 tablets by mouth daily.     Turmeric 400 MG CAPS Take 1 capsule by mouth daily.     losartan (COZAAR) 50 MG tablet TAKE 1 TABLET BY MOUTH EVERY DAY 90 tablet 1   metFORMIN (GLUCOPHAGE) 500 MG tablet TAKE 1 TABLET BY MOUTH TWICE A DAY WITH MEALS 180 tablet 3   omeprazole (PRILOSEC) 20 MG capsule Take 1 capsule (20 mg total) by mouth 2 (two) times daily. 180 capsule 1   sildenafil (REVATIO) 20 MG tablet TAKE 1 TABLET BY MOUTH THREE TIMES DAILY 90 tablet 0   Dextromethorphan-guaiFENesin 5-100 MG/5ML LIQD Take 10 mLs by mouth at bedtime as needed (for cough). (Patient not taking: Reported on 11/06/2022) 40 mL 0   No facility-administered medications prior to visit.    Review of Systems;  Patient denies headache, fevers, malaise, unintentional weight loss, skin rash, eye pain, sinus congestion and sinus pain, sore throat, dysphagia,  hemoptysis , cough, dyspnea,  wheezing, chest pain, palpitations, orthopnea, edema, abdominal pain, nausea, melena, diarrhea, constipation, flank pain, dysuria, hematuria, urinary  Frequency, nocturia, numbness, tingling, seizures,  Focal weakness, Loss of consciousness,  Tremor, insomnia, depression, anxiety, and suicidal ideation.      Objective:  BP (!) 162/80 (BP Location: Left Arm)   Pulse 85   Temp 98.4 F (36.9 C) (Oral)   Ht 5\' 10"  (1.778 m)   Wt 229 lb (103.9 kg)   SpO2 97%   BMI 32.86 kg/m   BP Readings from Last 3 Encounters:  11/06/22 (!) 162/80  07/18/22 120/76  04/18/22 136/74    Wt Readings  from Last 3 Encounters:  11/06/22 229 lb (103.9 kg)  07/18/22 224 lb 6.4 oz (101.8 kg)  04/18/22 227 lb (103 kg)    Physical Exam Vitals reviewed.  Constitutional:      General: He is not in acute distress.    Appearance: Normal appearance. He is normal weight. He is not ill-appearing, toxic-appearing or diaphoretic.  HENT:     Head: Normocephalic.  Eyes:     General: No scleral icterus.       Right eye: No discharge.        Left eye: No discharge.     Conjunctiva/sclera: Conjunctivae normal.  Cardiovascular:     Rate and Rhythm: Normal rate and regular rhythm.     Heart sounds: Normal heart sounds.  Pulmonary:     Effort: Pulmonary effort is normal. No respiratory distress.     Breath sounds: Normal breath sounds.  Musculoskeletal:        General: Normal range of motion.     Cervical back: Normal range of motion.  Skin:    General: Skin is warm and dry.  Neurological:     General: No focal deficit present.     Mental Status: He is alert and oriented to person, place, and time. Mental status is at baseline.  Psychiatric:        Mood and Affect: Mood normal.        Behavior: Behavior normal.        Thought Content: Thought content normal.        Judgment: Judgment normal.     Lab Results  Component Value Date   HGBA1C 7.0 (H) 04/18/2022   HGBA1C 6.7 (H) 10/04/2021   HGBA1C 6.8 (H) 03/31/2021    Lab Results  Component Value Date   CREATININE 1.15 04/18/2022   CREATININE 1.10 10/04/2021   CREATININE 1.18 03/31/2021    Lab Results  Component Value Date   WBC 8.5 04/18/2022   HGB 15.0 04/18/2022   HCT 42.6 04/18/2022   PLT 243.0 04/18/2022   GLUCOSE 171 (H) 04/18/2022   CHOL 117 04/18/2022   TRIG 163.0 (H) 04/18/2022   HDL 35.60 (L) 04/18/2022   LDLDIRECT 61.0 04/18/2022   LDLCALC 49 04/18/2022   ALT 23 04/18/2022   AST 17 04/18/2022   NA 135 04/18/2022   K 4.4 04/18/2022   CL 98 04/18/2022   CREATININE 1.15 04/18/2022   BUN 11 04/18/2022   CO2 26  04/18/2022   TSH 1.89 04/18/2022   PSA 1.35 03/13/2019   HGBA1C 7.0 (H) 04/18/2022   MICROALBUR <0.7 03/31/2021    US LIVER DOPPLER  Result Date: 04/13/2021 CLINICAL DATA:  History of non alcoholic cirrhosis. Evaluate hepatic vasculature. EXAM: DUPLEX ULTRASOUND OF LIVER TECHNIQUE: Color and duplex Doppler ultrasound was performed to evaluate the hepatic in-flow and out-flow vessels. COMPARISON:  03/26/2019; abdominal MRI  06/21/2017 FINDINGS: Liver: There is diffuse increased echogenicity of the hepatic parenchyma. This finding is associated with suspected mild nodularity of the hepatic contour (image 15). There is an approximately 1.9 x 1.8 x 1.6 cm minimally complex cyst within the left lobe of the liver as was characterized on abdominal MRI performed 06/21/2017 note is also made of a punctate (approximately 0.5 cm) hypoechoic lesion in the right lobe of the liver which is too small to accurately characterize though also favored to represent 1 of the hepatic cysts demonstrated on remote abdominal MRI. No discrete worrisome hepatic lesions. Limited sonographic evaluation of the gallbladder is normal. No intrahepatic biliary ductal dilatation. Portal Vein Velocities (normal hepatopetal directional flow) Main:  31 cm/sec Right:  19 cm/sec Left:  23 cm/sec Hepatic Vein Velocities (normal hepatofugal direction of flow) Right:  17 cm/sec Middle:  32 cm/sec Left:  24 cm/sec Hepatic Artery Velocity:  23 cm/sec Splenic Vein Velocity:  17 cm/sec Spleen: 14.3 x 10.4 x 5.5 cm with calculated volume of 432 cc, previously, calculated volume of 590 cc, likely technique related. Varices: Nonvisualized Ascites: None visualized IMPRESSION: 1. Diffuse increased echogenicity of the hepatic parenchyma with mild nodularity of the hepatic contour as best seen in the setting of hepatic cirrhosis. 2. No discrete worrisome hepatic lesions. Further evaluation with abdominal MRI can be performed as indicated. 3. Patent hepatic  vasculature with normal directional flow. 4. Borderline splenomegaly without evidence of ascites, similar to the 03/2019 examination. Electronically Signed   By: Simonne Come M.D.   On: 04/13/2021 09:55    Assessment & Plan:  .Obesity, diabetes, and hypertension syndrome (HCC) Assessment & Plan: Excellent control on metformin and Januvia.   Increasing losartan to 100 mg daily,  continue statin.   LDL is at goal on atorvasatin 20 mg .  Continue losartan 50 mg f or B P management   Lab Results  Component Value Date   HGBA1C 7.0 (H) 04/18/2022   Lab Results  Component Value Date   MICROALBUR <0.7 03/31/2021   MICROALBUR <0.7 08/21/2019   Lab Results  Component Value Date   CHOL 117 04/18/2022   HDL 35.60 (L) 04/18/2022   LDLCALC 49 04/18/2022   LDLDIRECT 61.0 04/18/2022   TRIG 163.0 (H) 04/18/2022   CHOLHDL 3 04/18/2022       Orders: -     Comprehensive metabolic panel -     Microalbumin / creatinine urine ratio -     Hemoglobin A1c  Hyperlipidemia associated with type 2 diabetes mellitus (HCC) -     Lipid panel -     LDL cholesterol, direct  NAFLD (nonalcoholic fatty liver disease) Assessment & Plan: With cirrhosis by prior evaluation at [facility]  .   Annual AFP tumor marker is due. ,   and liver doppler ultrasound has been done and is negative for masses.   Lab Results  Component Value Date   ALT 23 04/18/2022   AST 17 04/18/2022   ALKPHOS 52 04/18/2022   BILITOT 1.2 04/18/2022      Liver cirrhosis secondary to NASH [facility]) Assessment & Plan: With cirrhosis by prior evaluation at [facility].  .   Annual AFP tumor marker is due. ,   and liver doppler ultrasound has been done and is negative for masses.   Lab Results  Component Value Date   ALT 23 04/18/2022   AST 17 04/18/2022   ALKPHOS 52 04/18/2022   BILITOT 1.2 04/18/2022     Orders: -  AFP tumor marker -     US LIVER DOPPLER; Future  Aortic valve stenosis, etiology of cardiac valve disease  unspecified Assessment & Plan: REPEAT ECHO IS DUE GIVEN INCREASE IN BP   Orders: -     ECHOCARDIOGRAM COMPLETE; Future  Essential hypertension -     Losartan Potassium; Take 1 tablet (100 mg total) by mouth daily.  Dispense: 30 tablet; Refill: 1  Primary hypertension Assessment & Plan: INCREASE LOSARTAN TO 100 MG DAILY    Cerebrovascular disease, unspecified Assessment & Plan: SMALL VESSEL ISCHEMIC CHANGES NOTED ON HEAT ct DONE AT [facility] IN [place] 2024. Advised to add 81 mg asa daily to statin .    Bronchitis Assessment & Plan: He was treated in late May with abx.  Reutrned for persistent cough in June;  chest x ray normal.  Trial of flonase    Other orders -     metFORMIN HCl; Take 1 tablet (500 mg total) by mouth 2 (two) times daily with a meal.  Dispense: 180 tablet; Refill: 3 -     Omeprazole; Take 1 capsule (20 mg total) by mouth 2 (two) times daily.  Dispense: 180 capsule; Refill: 1 -     Sildenafil Citrate; Take 1 tablet (20 mg total) by mouth 3 (three) times daily.  Dispense: 90 tablet; Refill: 0 -     Fluticasone Propionate; Place 2 sprays into both nostrils daily.  Dispense: 16 g; Refill: 6 -     Aspirin; Take 1 tablet (81 mg total) by mouth daily. Swallow whole.   I have spent a total time of 45-minutes on the day of the appointment reviewing prior documentation, coordinating care and discussing medical diagnosis and plan with the patient/family. Imaging, labs and tests included in this note have been reviewed and interpreted independently by me.   Follow-up: Return in about 6 months (around 05/06/2023) for follow up diabetes.   Sherlene Shams, MD

## 2022-11-06 NOTE — Assessment & Plan Note (Signed)
REPEAT ECHO IS DUE GIVEN INCREASE IN BP

## 2022-11-06 NOTE — Assessment & Plan Note (Addendum)
With cirrhosis by prior evaluation at [facility].  .   Annual AFP tumor marker is due. ,   and liver doppler ultrasound has been done and is negative for masses.   Lab Results  Component Value Date   ALT 23 04/18/2022   AST 17 04/18/2022   ALKPHOS 52 04/18/2022   BILITOT 1.2 04/18/2022

## 2022-11-06 NOTE — Assessment & Plan Note (Signed)
SMALL VESSEL ISCHEMIC CHANGES NOTED ON HEAT ct DONE AT [facility] IN [place] 2024. Advised to add 81 mg asa daily to statin .

## 2022-11-09 ENCOUNTER — Ambulatory Visit
Admission: RE | Admit: 2022-11-09 | Discharge: 2022-11-09 | Disposition: A | Discharge status: Home / Self Care | Payer: Medicare Other | Source: Ambulatory Visit | Attending: Internal Medicine | Admitting: Internal Medicine

## 2022-11-09 DIAGNOSIS — K7581 Nonalcoholic steatohepatitis (NASH): Secondary | ICD-10-CM | POA: Insufficient documentation

## 2022-11-09 DIAGNOSIS — K7460 Unspecified cirrhosis of liver: Secondary | ICD-10-CM | POA: Insufficient documentation

## 2022-11-17 ENCOUNTER — Encounter: Payer: Self-pay | Admitting: Internal Medicine

## 2022-11-20 ENCOUNTER — Encounter: Payer: Self-pay | Admitting: Internal Medicine

## 2022-12-03 ENCOUNTER — Ambulatory Visit: Payer: Medicare Other

## 2022-12-22 ENCOUNTER — Other Ambulatory Visit: Payer: Self-pay | Admitting: Internal Medicine

## 2022-12-22 DIAGNOSIS — I10 Essential (primary) hypertension: Secondary | ICD-10-CM

## 2023-01-08 ENCOUNTER — Encounter: Payer: Self-pay | Admitting: Internal Medicine

## 2023-01-30 ENCOUNTER — Encounter: Payer: Self-pay | Admitting: Internal Medicine

## 2023-01-30 DIAGNOSIS — I10 Essential (primary) hypertension: Secondary | ICD-10-CM

## 2023-01-30 MED ORDER — LOSARTAN POTASSIUM 100 MG PO TABS: 100.0000 mg | ORAL_TABLET | Freq: Every day | ORAL | 3 refills | Status: DC

## 2023-02-26 ENCOUNTER — Other Ambulatory Visit: Payer: Self-pay | Admitting: Internal Medicine

## 2023-02-27 NOTE — Telephone Encounter (Signed)
 Spoke with pharmacy and inactivated the 50 mg losartan and they stated that they would get the 100 mg losartan filled and it would be ready for pick up tomorrow mid day. Called pt to let him know and her gave a verbal understanding.

## 2023-02-27 NOTE — Telephone Encounter (Signed)
 See previous message

## 2023-03-30 ENCOUNTER — Other Ambulatory Visit: Payer: Self-pay | Admitting: Internal Medicine

## 2023-05-06 ENCOUNTER — Ambulatory Visit: Payer: Medicare Other | Admitting: Internal Medicine

## 2023-05-08 ENCOUNTER — Encounter: Payer: Self-pay | Admitting: Internal Medicine

## 2023-05-08 ENCOUNTER — Ambulatory Visit (INDEPENDENT_AMBULATORY_CARE_PROVIDER_SITE_OTHER): Payer: Medicare Other | Admitting: Internal Medicine

## 2023-05-08 VITALS — BP 144/74 | HR 75 | Temp 97.9°F | Ht 70.0 in | Wt 224.0 lb

## 2023-05-08 DIAGNOSIS — I152 Hypertension secondary to endocrine disorders: Secondary | ICD-10-CM | POA: Diagnosis not present

## 2023-05-08 DIAGNOSIS — Z6832 Body mass index (BMI) 32.0-32.9, adult: Secondary | ICD-10-CM | POA: Diagnosis not present

## 2023-05-08 DIAGNOSIS — Z1211 Encounter for screening for malignant neoplasm of colon: Secondary | ICD-10-CM

## 2023-05-08 DIAGNOSIS — I350 Nonrheumatic aortic (valve) stenosis: Secondary | ICD-10-CM

## 2023-05-08 DIAGNOSIS — E669 Obesity, unspecified: Secondary | ICD-10-CM

## 2023-05-08 DIAGNOSIS — Z7984 Long term (current) use of oral hypoglycemic drugs: Secondary | ICD-10-CM

## 2023-05-08 DIAGNOSIS — E1169 Type 2 diabetes mellitus with other specified complication: Secondary | ICD-10-CM

## 2023-05-08 DIAGNOSIS — I10 Essential (primary) hypertension: Secondary | ICD-10-CM

## 2023-05-08 DIAGNOSIS — E119 Type 2 diabetes mellitus without complications: Secondary | ICD-10-CM

## 2023-05-08 DIAGNOSIS — J40 Bronchitis, not specified as acute or chronic: Secondary | ICD-10-CM

## 2023-05-08 DIAGNOSIS — R5383 Other fatigue: Secondary | ICD-10-CM | POA: Diagnosis not present

## 2023-05-08 DIAGNOSIS — E1159 Type 2 diabetes mellitus with other circulatory complications: Secondary | ICD-10-CM

## 2023-05-08 DIAGNOSIS — D369 Benign neoplasm, unspecified site: Secondary | ICD-10-CM

## 2023-05-08 DIAGNOSIS — E785 Hyperlipidemia, unspecified: Secondary | ICD-10-CM | POA: Diagnosis not present

## 2023-05-08 LAB — CBC WITH DIFFERENTIAL/PLATELET
Basophils Absolute: 0 10*3/uL (ref 0.0–0.1)
Basophils Relative: 0.6 % (ref 0.0–3.0)
Eosinophils Absolute: 0.2 10*3/uL (ref 0.0–0.7)
Eosinophils Relative: 2.9 % (ref 0.0–5.0)
HCT: 41.8 % (ref 39.0–52.0)
Hemoglobin: 14.6 g/dL (ref 13.0–17.0)
Lymphocytes Relative: 18.1 % (ref 12.0–46.0)
Lymphs Abs: 1.3 10*3/uL (ref 0.7–4.0)
MCHC: 34.8 g/dL (ref 30.0–36.0)
MCV: 87 fl (ref 78.0–100.0)
Monocytes Absolute: 0.5 10*3/uL (ref 0.1–1.0)
Monocytes Relative: 6.5 % (ref 3.0–12.0)
Neutro Abs: 5.2 10*3/uL (ref 1.4–7.7)
Neutrophils Relative %: 71.9 % (ref 43.0–77.0)
Platelets: 221 10*3/uL (ref 150.0–400.0)
RBC: 4.81 Mil/uL (ref 4.22–5.81)
RDW: 13.7 % (ref 11.5–15.5)
WBC: 7.3 10*3/uL (ref 4.0–10.5)

## 2023-05-08 LAB — COMPREHENSIVE METABOLIC PANEL
ALT: 20 U/L (ref 0–53)
AST: 16 U/L (ref 0–37)
Albumin: 4.6 g/dL (ref 3.5–5.2)
Alkaline Phosphatase: 46 U/L (ref 39–117)
BUN: 15 mg/dL (ref 6–23)
CO2: 29 meq/L (ref 19–32)
Calcium: 9.5 mg/dL (ref 8.4–10.5)
Chloride: 99 meq/L (ref 96–112)
Creatinine, Ser: 1.21 mg/dL (ref 0.40–1.50)
GFR: 57.43 mL/min — ABNORMAL LOW (ref >60.00)
Glucose, Bld: 209 mg/dL — ABNORMAL HIGH (ref 70–99)
Potassium: 4.4 meq/L (ref 3.5–5.1)
Sodium: 135 meq/L (ref 135–145)
Total Bilirubin: 1.1 mg/dL (ref 0.2–1.2)
Total Protein: 7.1 g/dL (ref 6.0–8.3)

## 2023-05-08 LAB — MICROALBUMIN / CREATININE URINE RATIO
Creatinine,U: 109.1 mg/dL
Microalb Creat Ratio: UNDETERMINED mg/g (ref 0.0–30.0)
Microalb, Ur: <0.7 mg/dL

## 2023-05-08 LAB — LIPID PANEL
Cholesterol: 109 mg/dL (ref 0–200)
HDL: 36.4 mg/dL — ABNORMAL LOW (ref >39.00)
LDL Cholesterol: 45 mg/dL (ref 0–99)
NonHDL: 72.14
Total CHOL/HDL Ratio: 3
Triglycerides: 137 mg/dL (ref 0.0–149.0)
VLDL: 27.4 mg/dL (ref 0.0–40.0)

## 2023-05-08 LAB — TSH: TSH: 1.62 u[IU]/mL (ref 0.35–5.50)

## 2023-05-08 LAB — HEMOGLOBIN A1C: Hgb A1c MFr Bld: 7.1 % — ABNORMAL HIGH (ref 4.6–6.5)

## 2023-05-08 LAB — LDL CHOLESTEROL, DIRECT: Direct LDL: 57 mg/dL

## 2023-05-08 MED ORDER — OMEPRAZOLE 20 MG PO CPDR: 20.0000 mg | DELAYED_RELEASE_CAPSULE | Freq: Two times a day (BID) | ORAL | 1 refills | Status: DC

## 2023-05-08 MED ORDER — SITAGLIPTIN PHOSPHATE 50 MG PO TABS: 50.0000 mg | ORAL_TABLET | Freq: Every day | ORAL | 1 refills | Status: DC

## 2023-05-08 NOTE — Assessment & Plan Note (Addendum)
 Currently taking only  metformin and Januvia.  Discussed adding  ozempic .if A1c remains > 6.5 on today's labs.    Continue  losartan to 100 mg daily,  continue statin.  Reminded to add baby asa.   LDL is at goal on atorvasatin 20 mg I have reviewed patient's  prior screening test for nephropathy, and the corrected UaCr is   12.  No medication changes are needed  , but given his elevated BP will recheck today     Lab Results  Component Value Date   MICROALBUR <0.7 11/06/2022   MICROALBUR <0.7 03/31/2021   Lab Results  Component Value Date   CHOL 115 11/06/2022   HDL 33.50 (L) 11/06/2022   LDLCALC 33 11/06/2022   LDLDIRECT 60.0 11/06/2022   TRIG 241.0 (H) 11/06/2022   CHOLHDL 3 11/06/2022

## 2023-05-08 NOTE — Patient Instructions (Addendum)
 For the persistent cough:  Continue flonase once daily in the morning  Try elevating  your torso and head  when in bed by adjusting the top part of the bed (th)is treats reflux Add allegra at night   \omeprazole needs to be taken on an empty stomach to be absorbed and treat reflux   If cough does not resolve in 2 weeks with aggressive treatment of allergies,  we will make pulmonology referral  and I will order a CT of the chest to be done prior to your pulmonology evaluation   GI and cardiology referrals are also in process   If your A1c is 6.6 or higher today,  I will be  recommending an injectable medication to help curb your appetite,  It is called Ozempic.  It slows down your stomach emptying so you stay full longer .

## 2023-05-08 NOTE — Progress Notes (Unsigned)
 Subjective:  Patient ID: Chad Gill, male    DOB: [birth year]  Age: 78 y.o. MRN: [id number]  CC: There were no encounter diagnoses.   HPI Chad Gill presents for  Chief Complaint  Patient presents with   Medical Management of Chronic Issues    6 month follow up    CC:  NOCTURNAL COUGH OCCURRING LESS FREQUENTLY DURING THE DAY.  HAS BEEN GOING ON FOR OVER A YEAR. Using flonase for the past 1-2 weeks an regular use of omeprazole.  No cold symptoms,  does not sneeze   2) T2DM:  He  feels generally well,  But is not  exercising regularly  and trying to lose weight. Checking  blood sugars  once weekly  variable times, usually only if she feels she may be having a hypoglycemic event. . Not taking a baby asa daily   Denies any recent hypoglyemic events.  Taking   medications as directed. Following a carbohydrate modified diet 6 days per week. Denies numbness, burning and tingling of extremities. Appetite is good.    Lab Results  Component Value Date   HGBA1C 7.4 (H) 11/06/2022      Outpatient Medications Prior to Visit  Medication Sig Dispense Refill   Ascorbic Acid (VITAMIN C PO) Take 500 mg by mouth.     aspirin EC 81 MG tablet Take 1 tablet (81 mg total) by mouth daily. Swallow whole.     atorvastatin (LIPITOR) 20 MG tablet TAKE 1 TABLET BY MOUTH EVERY DAY 90 tablet 3   fluticasone (FLONASE) 50 MCG/ACT nasal spray SPRAY 2 SPRAYS INTO EACH NOSTRIL EVERY DAY 48 mL 2   glucose blood test strip Use to check blood sugars once daily 100 each 12   Lancets (ONETOUCH ULTRASOFT) lancets Use as instructed 100 each 12   losartan (COZAAR) 100 MG tablet Take 1 tablet (100 mg total) by mouth daily. 90 tablet 3   metFORMIN (GLUCOPHAGE) 500 MG tablet Take 1 tablet (500 mg total) by mouth 2 (two) times daily with a meal. 180 tablet 3   omeprazole (PRILOSEC) 20 MG capsule Take 1 capsule (20 mg total) by mouth 2 (two) times daily. 180 capsule 1   sildenafil (REVATIO) 20 MG tablet TAKE 1  TABLET BY MOUTH THREE TIMES A DAY 270 tablet 1   sitaGLIPtin (JANUVIA) 50 MG tablet TAKE 1 TABLET BY MOUTH EVERY DAY 90 tablet 0   Turmeric 400 MG CAPS Take 1 capsule by mouth daily.     Multiple Vitamins-Minerals (PRESERVISION AREDS 2 PO) Take 2 tablets by mouth daily. (Patient not taking: Reported on 05/08/2023)     No facility-administered medications prior to visit.    Review of Systems;  Patient denies headache, fevers, malaise, unintentional weight loss, skin rash, eye pain, sinus congestion and sinus pain, sore throat, dysphagia,  hemoptysis , cough, dyspnea, wheezing, chest pain, palpitations, orthopnea, edema, abdominal pain, nausea, melena, diarrhea, constipation, flank pain, dysuria, hematuria, urinary  Frequency, nocturia, numbness, tingling, seizures,  Focal weakness, Loss of consciousness,  Tremor, insomnia, depression, anxiety, and suicidal ideation.      Objective:  BP (!) 160/78   Pulse 75   Temp 97.9 F (36.6 C) (Oral)   Ht 5\' 10"  (1.778 m)   Wt 224 lb (101.6 kg)   SpO2 96%   BMI 32.14 kg/m   BP Readings from Last 3 Encounters:  05/08/23 (!) 160/78  11/06/22 (!) 162/80  07/18/22 120/76    Wt Readings from Last 3 Encounters:  05/08/23 224 lb (101.6 kg)  11/06/22 229 lb (103.9 kg)  07/18/22 224 lb 6.4 oz (101.8 kg)    Physical Exam  Lab Results  Component Value Date   HGBA1C 7.4 (H) 11/06/2022   HGBA1C 7.0 (H) 04/18/2022   HGBA1C 6.7 (H) 10/04/2021    Lab Results  Component Value Date   CREATININE 1.22 11/06/2022   CREATININE 1.15 04/18/2022   CREATININE 1.10 10/04/2021    Lab Results  Component Value Date   WBC 8.5 04/18/2022   HGB 15.0 04/18/2022   HCT 42.6 04/18/2022   PLT 243.0 04/18/2022   GLUCOSE 288 (H) 11/06/2022   CHOL 115 11/06/2022   TRIG 241.0 (H) 11/06/2022   HDL 33.50 (L) 11/06/2022   LDLDIRECT 60.0 11/06/2022   LDLCALC 33 11/06/2022   ALT 18 11/06/2022   AST 15 11/06/2022   NA 135 11/06/2022   K 4.8 11/06/2022   CL 99  11/06/2022   CREATININE 1.22 11/06/2022   BUN 14 11/06/2022   CO2 29 11/06/2022   TSH 1.89 04/18/2022   PSA 1.35 03/13/2019   HGBA1C 7.4 (H) 11/06/2022   MICROALBUR <0.7 11/06/2022    US LIVER DOPPLER Result Date: 11/16/2022 CLINICAL DATA:  NASH cirrhosis, multiple liver cysts EXAM: DUPLEX ULTRASOUND OF LIVER TECHNIQUE: Color and duplex Doppler ultrasound was performed to evaluate the hepatic in-flow and out-flow vessels. COMPARISON:  Prior liver ultrasound 04/13/2021 FINDINGS: Liver: Diffusely increased echogenicity of the hepatic parenchyma with coarsening of the echotexture. Mildly nodular hepatic contour. Multiple circumscribed anechoic simple cysts are again noted scattered throughout the liver. The largest measures 1.5 x 1.2 x 1.9 cm in the left hepatic lobe. Positive posterior acoustic enhancement. Findings are consistent with simple cysts. No discrete solid lesion is identified. No biliary ductal dilatation. Main Portal Vein size: 1.0 cm Portal Vein Velocities Main Prox:  43 cm/sec with normal hepatopetal directional flow Main Mid: 48 cm/sec with normal hepatopetal directional flow Main Dist:  38 cm/sec with normal hepatopetal directional flow Right: 19 cm/sec with normal hepatopetal directional flow Left: 32 cm/sec with normal hepatopetal directional flow Hepatic Vein Velocities Right:  46 cm/sec Middle:  35 cm/sec Left:  23 cm/sec IVC: Present and patent with normal respiratory phasicity. Hepatic Artery Velocity:  84 cm/sec Splenic Vein Velocity:  27 cm/sec Spleen: 14.6 cm x 14.3 cm x 5.0 cm with a total volume of 545 cm^3 (411 cm^3 is upper limit normal) Portal Vein Occlusion/Thrombus: No Splenic Vein Occlusion/Thrombus: No Ascites: None Varices: None IMPRESSION: 1. Portal veins remain patent with normal hepatopetal directional flow. 2. Enlarging spleen consistent with progressive portal hypertension. 3. Stable appearance of echogenic and nodular liver consistent with NASH cirrhosis. 4. No  evidence of solid hepatic lesion. 5. Negative for ascites. Signed, Sterling Big, MD, RPVI [facility] Electronically Signed   By: Malachy Moan M.D.   On: 11/16/2022 13:29    Assessment & Plan:  .There are no diagnoses linked to this encounter.   I spent 34 minutes on the day of this face to face encounter reviewing patient's  most recent visit with cardiology,  nephrology,  and neurology,  prior relevant surgical and non surgical procedures, recent  labs and imaging studies, counseling on weight management,  reviewing the assessment and plan with patient, and post visit ordering and reviewing of  diagnostics and therapeutics with patient  .   Follow-up: No follow-ups on file.   Sherlene Shams, MD

## 2023-05-09 ENCOUNTER — Encounter: Payer: Self-pay | Admitting: Internal Medicine

## 2023-05-09 NOTE — Assessment & Plan Note (Signed)
 He is due for colonoscopy. Referral in progress

## 2023-05-09 NOTE — Assessment & Plan Note (Addendum)
 LDL and triglycerides are at goal on atorvastatin 20 mg.  He has no proteinuria,  UaCr is < 5 .  He has cirrhosis secondary to NASH, by MRI abdomen done in 2019,  and liver enzymes have normalized .   Diabetes is controlled with metformin and Januvia .  Lab Results  Component Value Date   HGBA1C 7.1 (H) 05/08/2023     Lab Results  Component Value Date   CHOL 109 05/08/2023   HDL 36.40 (L) 05/08/2023   LDLCALC 45 05/08/2023   LDLDIRECT 57.0 05/08/2023   TRIG 137.0 05/08/2023   CHOLHDL 3 05/08/2023   Lab Results  Component Value Date   ALT 20 05/08/2023   AST 16 05/08/2023   ALKPHOS 46 05/08/2023   BILITOT 1.1 05/08/2023   Lab Results  Component Value Date   HGBA1C 7.1 (H) 05/08/2023

## 2023-05-09 NOTE — Assessment & Plan Note (Signed)
 He was treated in late May with abx.  Reutrned for persistent cough in June;  chest x ray normal. Advised to repeat a trial of flonase and omeprazole (taken correctly);  Chest CT ordered

## 2023-05-09 NOTE — Assessment & Plan Note (Signed)
 Improved control with increase in LOSARTAN TO 100 MG DAILY   Lab Results  Component Value Date   MICROALBUR <0.7 05/08/2023   MICROALBUR <0.7 11/06/2022    ' Lab Results  Component Value Date   NA 135 05/08/2023   K 4.4 05/08/2023   CL 99 05/08/2023   CO2 29 05/08/2023

## 2023-05-20 ENCOUNTER — Ambulatory Visit

## 2023-05-20 DIAGNOSIS — Z7985 Long-term (current) use of injectable non-insulin antidiabetic drugs: Secondary | ICD-10-CM

## 2023-05-20 DIAGNOSIS — E785 Hyperlipidemia, unspecified: Secondary | ICD-10-CM | POA: Diagnosis not present

## 2023-05-20 DIAGNOSIS — E1169 Type 2 diabetes mellitus with other specified complication: Secondary | ICD-10-CM

## 2023-05-20 NOTE — Progress Notes (Signed)
 Pt presented for an Ozempic teaching with a sample. Pt was identified through two identifiers.   I demonstrated how to attach the needle to the pen and how to make sure pt was getting the correct dose of 0.25. I showed pt where he can give himself the injection 2 inches from the belly button. Pt did not want to give himself the inject yet he would like to do it at home later.    Medication Samples have been provided to the patient.  Drug name: Ozempic        Strength: 0.25        Qty: 1 box  LOT: [id number] Exp.Date: 07-19-24  Dosing instructions: inject 0.25 mg into the skin once weekly   The patient has been instructed regarding the correct time, dose, and frequency of taking this medication, including desired effects and most common side effects.   Donavan Foil 10:39 AM 05/20/2023

## 2023-05-30 ENCOUNTER — Encounter: Payer: Self-pay | Admitting: Internal Medicine

## 2023-06-05 ENCOUNTER — Other Ambulatory Visit: Payer: Self-pay | Admitting: Internal Medicine

## 2023-06-05 MED ORDER — SEMAGLUTIDE(0.25 OR 0.5MG/DOS) 2 MG/3ML ~~LOC~~ SOPN: 0.2500 mg | PEN_INJECTOR | SUBCUTANEOUS | 2 refills | Status: DC

## 2023-06-05 NOTE — Telephone Encounter (Signed)
 Called pt and he stated that we gave him a sample here in the office at last appt. The pen is broken. Do you want me to give him another sample if we have it or do we need to call in a rx to the pharmacy?

## 2023-06-10 NOTE — Telephone Encounter (Signed)
 Spoke with [facility] and they have the pt's medication ready for pick up. Co pay is $30. Pt is aware.

## 2023-07-01 ENCOUNTER — Encounter: Payer: Self-pay | Admitting: Internal Medicine

## 2023-07-03 MED ORDER — SEMAGLUTIDE(0.25 OR 0.5MG/DOS) 2 MG/3ML ~~LOC~~ SOPN
0.5000 mg | PEN_INJECTOR | SUBCUTANEOUS | 2 refills | Status: DC
Start: 2023-07-03 — End: 2023-08-08

## 2023-08-08 ENCOUNTER — Ambulatory Visit (INDEPENDENT_AMBULATORY_CARE_PROVIDER_SITE_OTHER): Admitting: Internal Medicine

## 2023-08-08 ENCOUNTER — Encounter: Payer: Self-pay | Admitting: Internal Medicine

## 2023-08-08 ENCOUNTER — Telehealth: Payer: Self-pay | Admitting: Internal Medicine

## 2023-08-08 VITALS — BP 130/82 | HR 82 | Ht 70.0 in | Wt 223.6 lb

## 2023-08-08 DIAGNOSIS — E785 Hyperlipidemia, unspecified: Secondary | ICD-10-CM | POA: Diagnosis not present

## 2023-08-08 DIAGNOSIS — R053 Chronic cough: Secondary | ICD-10-CM

## 2023-08-08 DIAGNOSIS — E1169 Type 2 diabetes mellitus with other specified complication: Secondary | ICD-10-CM | POA: Diagnosis not present

## 2023-08-08 DIAGNOSIS — Z7985 Long-term (current) use of injectable non-insulin antidiabetic drugs: Secondary | ICD-10-CM

## 2023-08-08 DIAGNOSIS — E1159 Type 2 diabetes mellitus with other circulatory complications: Secondary | ICD-10-CM

## 2023-08-08 DIAGNOSIS — K7581 Nonalcoholic steatohepatitis (NASH): Secondary | ICD-10-CM

## 2023-08-08 DIAGNOSIS — J40 Bronchitis, not specified as acute or chronic: Secondary | ICD-10-CM

## 2023-08-08 DIAGNOSIS — I2510 Atherosclerotic heart disease of native coronary artery without angina pectoris: Secondary | ICD-10-CM

## 2023-08-08 DIAGNOSIS — I2584 Coronary atherosclerosis due to calcified coronary lesion: Secondary | ICD-10-CM

## 2023-08-08 DIAGNOSIS — Z7984 Long term (current) use of oral hypoglycemic drugs: Secondary | ICD-10-CM

## 2023-08-08 DIAGNOSIS — E669 Obesity, unspecified: Secondary | ICD-10-CM

## 2023-08-08 DIAGNOSIS — D369 Benign neoplasm, unspecified site: Secondary | ICD-10-CM

## 2023-08-08 DIAGNOSIS — K7460 Unspecified cirrhosis of liver: Secondary | ICD-10-CM

## 2023-08-08 DIAGNOSIS — Z6832 Body mass index (BMI) 32.0-32.9, adult: Secondary | ICD-10-CM

## 2023-08-08 DIAGNOSIS — I152 Hypertension secondary to endocrine disorders: Secondary | ICD-10-CM

## 2023-08-08 LAB — COMPREHENSIVE METABOLIC PANEL WITH GFR
ALT: 16 U/L (ref 0–53)
AST: 13 U/L (ref 0–37)
Albumin: 4.4 g/dL (ref 3.5–5.2)
Alkaline Phosphatase: 44 U/L (ref 39–117)
BUN: 13 mg/dL (ref 6–23)
CO2: 31 meq/L (ref 19–32)
Calcium: 9.4 mg/dL (ref 8.4–10.5)
Chloride: 99 meq/L (ref 96–112)
Creatinine, Ser: 1.23 mg/dL (ref 0.40–1.50)
GFR: 56.21 mL/min — ABNORMAL LOW (ref >60.00)
Glucose, Bld: 147 mg/dL — ABNORMAL HIGH (ref 70–99)
Potassium: 4.3 meq/L (ref 3.5–5.1)
Sodium: 135 meq/L (ref 135–145)
Total Bilirubin: 1.4 mg/dL — ABNORMAL HIGH (ref 0.2–1.2)
Total Protein: 6.6 g/dL (ref 6.0–8.3)

## 2023-08-08 LAB — LIPID PANEL
Cholesterol: 97 mg/dL (ref 0–200)
HDL: 31.8 mg/dL — ABNORMAL LOW
LDL Cholesterol: 43 mg/dL (ref 0–99)
NonHDL: 65.38
Total CHOL/HDL Ratio: 3
Triglycerides: 114 mg/dL (ref 0.0–149.0)
VLDL: 22.8 mg/dL (ref 0.0–40.0)

## 2023-08-08 LAB — LDL CHOLESTEROL, DIRECT: Direct LDL: 51 mg/dL

## 2023-08-08 LAB — HEMOGLOBIN A1C: Hgb A1c MFr Bld: 6.6 % — ABNORMAL HIGH (ref 4.6–6.5)

## 2023-08-08 MED ORDER — SEMAGLUTIDE (1 MG/DOSE) 4 MG/3ML ~~LOC~~ SOPN: 1.0000 mg | PEN_INJECTOR | SUBCUTANEOUS | 2 refills | Status: DC

## 2023-08-08 NOTE — Patient Instructions (Addendum)
 YOU SHOULD NOT BE TAKING JANUVIA  A NOW THAT YOU HAVE STARTED TAKING OZEMPIC .  SO I HAVE DISCONTINUED IT FROM YOUR LIST  THE NEXT DOSE OF OZEMPIC  WILL BE HIGHER   1 MG  WEEKLY  I have ordered a CT of your chest to investigate the cough

## 2023-08-08 NOTE — Progress Notes (Addendum)
 Subjective:  Patient ID: Chad Gill, male    DOB: 10-26-1945  Age: 78 y.o. MRN: [id number]  CC: The primary encounter diagnosis was Obesity, diabetes, and hypertension syndrome (HCC). Diagnoses of Hyperlipidemia associated with type 2 diabetes mellitus (HCC), Persistent cough for 3 weeks or longer, Bronchitis, Liver cirrhosis secondary to NASH [facility]), and Coronary artery disease due to calcified coronary lesion were also pertinent to this visit.   HPI Chad Gill presents for  Chief Complaint  Patient presents with   Medical Management of Chronic Issues    3 month follow up    1) persistent cough:  daily use of flonase  and PPI was eecommended at last visit  after cough failed to improve with treatment for bronchitis.  The cough has improved but not resolved,  and contnues to occur daily in the daytime and evening . He denies a temporal  relationship to exercise and eating and denies shortness of breath,  chest pain,  and unintentional weight loss   2) type 2 DM:  ozempic  was added after last visit  to manage obesity and improve glycemic control.  He is currently on 0.5 mg dose ; no weight loss yet. Denies constipation, abdominall pain, and nausea.    Outpatient Medications Prior to Visit  Medication Sig Dispense Refill   Ascorbic Acid (VITAMIN C PO) Take 500 mg by mouth.     aspirin  EC 81 MG tablet Take 1 tablet (81 mg total) by mouth daily. Swallow whole.     atorvastatin  (LIPITOR) 20 MG tablet TAKE 1 TABLET BY MOUTH EVERY DAY 90 tablet 3   fluticasone  (FLONASE ) 50 MCG/ACT nasal spray SPRAY 2 SPRAYS INTO EACH NOSTRIL EVERY DAY (Patient not taking: Reported on 08/20/2023) 48 mL 2   glucose blood test strip Use to check blood sugars once daily 100 each 12   Lancets (ONETOUCH ULTRASOFT) lancets Use as instructed 100 each 12   metFORMIN  (GLUCOPHAGE ) 500 MG tablet Take 1 tablet (500 mg total) by mouth 2 (two) times daily with a meal. 180 tablet 3   sildenafil  (REVATIO ) 20 MG tablet  TAKE 1 TABLET BY MOUTH THREE TIMES A DAY 270 tablet 1   Turmeric 400 MG CAPS Take 1 capsule by mouth daily.     losartan  (COZAAR ) 100 MG tablet Take 1 tablet (100 mg total) by mouth daily. 90 tablet 3   omeprazole  (PRILOSEC) 20 MG capsule Take 1 capsule (20 mg total) by mouth 2 (two) times daily. 180 capsule 1   Semaglutide ,0.25 or 0.5MG /DOS, 2 MG/3ML SOPN Inject 0.5 mg into the skin once a week. 3 mL 2   sitaGLIPtin  (JANUVIA ) 50 MG tablet Take 1 tablet (50 mg total) by mouth daily. 90 tablet 1   No facility-administered medications prior to visit.    Review of Systems;  Patient denies headache, fevers, malaise, unintentional weight loss, skin rash, eye pain, sinus congestion and sinus pain, sore throat, dysphagia,  hemoptysis , cough, dyspnea, wheezing, chest pain, palpitations, orthopnea, edema, abdominal pain, nausea, melena, diarrhea, constipation, flank pain, dysuria, hematuria, urinary  Frequency, nocturia, numbness, tingling, seizures,  Focal weakness, Loss of consciousness,  Tremor, insomnia, depression, anxiety, and suicidal ideation.      Objective:  BP 130/82   Pulse 82   Ht 5' 10 (1.778 m)   Wt 223 lb 9.6 oz (101.4 kg)   SpO2 95%   BMI 32.08 kg/m   BP Readings from Last 3 Encounters:  08/14/23 (!) 160/80  08/08/23 130/82  05/08/23 ROLLEN)  144/74    Wt Readings from Last 3 Encounters:  08/20/23 219 lb (99.3 kg)  08/14/23 223 lb 12.8 oz (101.5 kg)  08/08/23 223 lb 9.6 oz (101.4 kg)    Physical Exam Vitals reviewed.  Constitutional:      General: He is not in acute distress.    Appearance: Normal appearance. He is normal weight. He is not ill-appearing, toxic-appearing or diaphoretic.  HENT:     Head: Normocephalic.  Eyes:     General: No scleral icterus.       Right eye: No discharge.        Left eye: No discharge.     Conjunctiva/sclera: Conjunctivae normal.  Cardiovascular:     Rate and Rhythm: Normal rate and regular rhythm.     Heart sounds: Normal heart  sounds.  Pulmonary:     Effort: Pulmonary effort is normal. No respiratory distress.     Breath sounds: Normal breath sounds.  Musculoskeletal:        General: Normal range of motion.     Cervical back: Normal range of motion.  Skin:    General: Skin is warm and dry.  Neurological:     General: No focal deficit present.     Mental Status: He is alert and oriented to person, place, and time. Mental status is at baseline.  Psychiatric:        Mood and Affect: Mood normal.        Behavior: Behavior normal.        Thought Content: Thought content normal.        Judgment: Judgment normal.     Lab Results  Component Value Date   HGBA1C 6.6 (H) 08/08/2023   HGBA1C 7.1 (H) 05/08/2023   HGBA1C 7.4 (H) 11/06/2022    Lab Results  Component Value Date   CREATININE 1.23 08/08/2023   CREATININE 1.21 05/08/2023   CREATININE 1.22 11/06/2022    Lab Results  Component Value Date   WBC 7.3 05/08/2023   HGB 14.6 05/08/2023   HCT 41.8 05/08/2023   PLT 221.0 05/08/2023   GLUCOSE 147 (H) 08/08/2023   CHOL 97 08/08/2023   TRIG 114.0 08/08/2023   HDL 31.80 (L) 08/08/2023   LDLDIRECT 51.0 08/08/2023   LDLCALC 43 08/08/2023   ALT 16 08/08/2023   AST 13 08/08/2023   NA 135 08/08/2023   K 4.3 08/08/2023   CL 99 08/08/2023   CREATININE 1.23 08/08/2023   BUN 13 08/08/2023   CO2 31 08/08/2023   TSH 1.62 05/08/2023   PSA 1.35 03/13/2019   HGBA1C 6.6 (H) 08/08/2023   MICROALBUR <0.7 05/08/2023    US  LIVER DOPPLER Result Date: 11/16/2022 CLINICAL DATA:  NASH cirrhosis, multiple liver cysts EXAM: DUPLEX ULTRASOUND OF LIVER TECHNIQUE: Color and duplex Doppler ultrasound was performed to evaluate the hepatic in-flow and out-flow vessels. COMPARISON:  Prior liver ultrasound 04/13/2021 FINDINGS: Liver: Diffusely increased echogenicity of the hepatic parenchyma with coarsening of the echotexture. Mildly nodular hepatic contour. Multiple circumscribed anechoic simple cysts are again noted  scattered throughout the liver. The largest measures 1.5 x 1.2 x 1.9 cm in the left hepatic lobe. Positive posterior acoustic enhancement. Findings are consistent with simple cysts. No discrete solid lesion is identified. No biliary ductal dilatation. Main Portal Vein size: 1.0 cm Portal Vein Velocities Main Prox:  43 cm/sec with normal hepatopetal directional flow Main Mid: 48 cm/sec with normal hepatopetal directional flow Main Dist:  38 cm/sec with normal hepatopetal directional flow Right: 19 cm/sec  with normal hepatopetal directional flow Left: 32 cm/sec with normal hepatopetal directional flow Hepatic Vein Velocities Right:  46 cm/sec Middle:  35 cm/sec Left:  23 cm/sec IVC: Present and patent with normal respiratory phasicity. Hepatic Artery Velocity:  84 cm/sec Splenic Vein Velocity:  27 cm/sec Spleen: 14.6 cm x 14.3 cm x 5.0 cm with a total volume of 545 cm^3 (411 cm^3 is upper limit normal) Portal Vein Occlusion/Thrombus: No Splenic Vein Occlusion/Thrombus: No Ascites: None Varices: None IMPRESSION: 1. Portal veins remain patent with normal hepatopetal directional flow. 2. Enlarging spleen consistent with progressive portal hypertension. 3. Stable appearance of echogenic and nodular liver consistent with NASH cirrhosis. 4. No evidence of solid hepatic lesion. 5. Negative for ascites. Signed, Chad LOIS Lent, Chad Gill, [facility] Radiology Electronically Signed   By: Chad Gill M.D.   On: 11/16/2022 13:29    Assessment & Plan:  .Obesity, diabetes, and hypertension syndrome (HCC) Assessment & Plan: Now taking ozempic .  Still taking   metformin  and Januvia .  Januvia  discontinued.  He has lost 6 lbs since the start of medicatio nand is picking up a reill on the  0.5 mg  does.  Will incrae to 1 mg with next refill.  Taking baby asa.     Continue  losartan  to 100 mg daily,  continue statin.     LDL is at goal on atorvasatin 20 mg I have reviewed  patient's  prior screening test for nephropathy, and the corrected UaCr is   12.  No medication changes are needed   Lab Results  Component Value Date   MICROALBUR <0.7 05/08/2023   MICROALBUR <0.7 11/06/2022   Lab Results  Component Value Date   CHOL 109 05/08/2023   HDL 36.40 (L) 05/08/2023   LDLCALC 45 05/08/2023   LDLDIRECT 57.0 05/08/2023   TRIG 137.0 05/08/2023   CHOLHDL 3 05/08/2023       Orders: -     Hemoglobin A1c -     Comprehensive metabolic panel with GFR  Hyperlipidemia associated with type 2 diabetes mellitus (HCC) Assessment & Plan: LDL and triglycerides are at goal on atorvastatin  20 mg.  He has no proteinuria,  UaCr is < 5 .  He has cirrhosis secondary to [facility], by MRI abdomen done in 2019,  and liver enzymes have normalized .   Diabetes is controlled with metformin  and Januvia  .  Lab Results  Component Value Date   HGBA1C 6.6 (H) 08/08/2023     Lab Results  Component Value Date   CHOL 97 08/08/2023   HDL 31.80 (L) 08/08/2023   LDLCALC 43 08/08/2023   LDLDIRECT 51.0 08/08/2023   TRIG 114.0 08/08/2023   CHOLHDL 3 08/08/2023   Lab Results  Component Value Date   ALT 16 08/08/2023   AST 13 08/08/2023   ALKPHOS 44 08/08/2023   BILITOT 1.4 (H) 08/08/2023   Lab Results  Component Value Date   HGBA1C 6.6 (H) 08/08/2023     Orders: -     Lipid panel -     LDL cholesterol, direct  Persistent cough for 3 weeks or longer Assessment & Plan: He has emphysematous changes and pleural based placques on chest CT ; pulmonary consult ordered   Orders: -     CT CHEST WO CONTRAST; Future -     Pulmonary Visit  Bronchitis Assessment & Plan: He was treated in late May with abx.  Retured  for persistent cough in June;  chest x  ray normal. Advised to repeat a trial of flonase  and omeprazole  (taken correctly);  Chest CT ordered    Liver cirrhosis secondary to NASH [facility]) Assessment & Plan: With cirrhosis by prior evaluation at [facility].  .   Annual screening  for hepatocellular CA with AFP tumor marker I every September ,  and liver doppler ultrasound has been done and is negative for masses.   Lab Results  Component Value Date   ALT 16 08/08/2023   AST 13 08/08/2023   ALKPHOS 44 08/08/2023   BILITOT 1.4 (H) 08/08/2023      Coronary artery disease due to calcified coronary lesion Assessment & Plan: Asymptomatic, noted on Chest CT. He is adherent to statin therapy and LDL is < 70.  Recommend continued  use of GLP 1 agonist, statin and baby aspirin ; and semi annual follow up with Dr Wynne  )(last visit June 2025)   Other orders -     Semaglutide  (1 MG/DOSE); Inject 1 mg as directed once a week.  Dispense: 3 mL; Refill: 2     I spent 34 minutes on the day of this face to face encounter reviewing patient's  prior relevant surgical and non surgical procedures, recent  labs and imaging studies, counseling on weight management,  reviewing the assessment and plan with patient, and post visit ordering and reviewing of  diagnostics and therapeutics with patient  .   Follow-up: Return in about 3 months (around 11/08/2023).   Chad LITTIE Kettering, Chad Gill

## 2023-08-08 NOTE — Assessment & Plan Note (Signed)
 Now taking ozempic .  Still taking   metformin  and Januvia .  Januvia  discontinued.  He has lost 6 lbs since the start of medicatio nand is picking up a reill on the  0.5 mg  does.  Will incrae to 1 mg with next refill.  Taking baby asa.     Continue  losartan  to 100 mg daily,  continue statin.     LDL is at goal on atorvasatin 20 mg I have reviewed patient's  prior screening test for nephropathy, and the corrected UaCr is   12.  No medication changes are needed   Lab Results  Component Value Date   MICROALBUR <0.7 05/08/2023   MICROALBUR <0.7 11/06/2022   Lab Results  Component Value Date   CHOL 109 05/08/2023   HDL 36.40 (L) 05/08/2023   LDLCALC 45 05/08/2023   LDLDIRECT 57.0 05/08/2023   TRIG 137.0 05/08/2023   CHOLHDL 3 05/08/2023

## 2023-08-10 NOTE — Assessment & Plan Note (Addendum)
 He has emphysematous changes and pleural based placques on chest CT ; pulmonary consult ordered

## 2023-08-10 NOTE — Assessment & Plan Note (Addendum)
 With cirrhosis by prior evaluation at [facility].  .   Annual screening for hepatocellular CA with AFP tumor marker I every September ,  and liver doppler ultrasound has been done and is negative for masses.   Lab Results  Component Value Date   ALT 16 08/08/2023   AST 13 08/08/2023   ALKPHOS 44 08/08/2023   BILITOT 1.4 (H) 08/08/2023

## 2023-08-10 NOTE — Assessment & Plan Note (Signed)
 LDL and triglycerides are at goal on atorvastatin  20 mg.  He has no proteinuria,  UaCr is < 5 .  He has cirrhosis secondary to [facility], by MRI abdomen done in 2019,  and liver enzymes have normalized .   Diabetes is controlled with metformin  and Januvia  .  Lab Results  Component Value Date   HGBA1C 6.6 (H) 08/08/2023     Lab Results  Component Value Date   CHOL 97 08/08/2023   HDL 31.80 (L) 08/08/2023   LDLCALC 43 08/08/2023   LDLDIRECT 51.0 08/08/2023   TRIG 114.0 08/08/2023   CHOLHDL 3 08/08/2023   Lab Results  Component Value Date   ALT 16 08/08/2023   AST 13 08/08/2023   ALKPHOS 44 08/08/2023   BILITOT 1.4 (H) 08/08/2023   Lab Results  Component Value Date   HGBA1C 6.6 (H) 08/08/2023

## 2023-08-10 NOTE — Assessment & Plan Note (Signed)
 He was treated in late May with abx.  Retured  for persistent cough in June;  chest x ray normal. Advised to repeat a trial of flonase  and omeprazole  (taken correctly);  Chest CT ordered

## 2023-08-11 ENCOUNTER — Ambulatory Visit: Payer: Self-pay | Admitting: Internal Medicine

## 2023-08-13 ENCOUNTER — Encounter: Payer: Self-pay | Admitting: Internal Medicine

## 2023-08-14 ENCOUNTER — Encounter: Payer: Self-pay | Admitting: Cardiology

## 2023-08-14 ENCOUNTER — Ambulatory Visit: Attending: Cardiology | Admitting: Cardiology

## 2023-08-14 VITALS — BP 160/80 | HR 85 | Ht 70.0 in | Wt 223.8 lb

## 2023-08-14 DIAGNOSIS — I350 Nonrheumatic aortic (valve) stenosis: Secondary | ICD-10-CM | POA: Insufficient documentation

## 2023-08-14 DIAGNOSIS — E7800 Pure hypercholesterolemia, unspecified: Secondary | ICD-10-CM | POA: Diagnosis present

## 2023-08-14 DIAGNOSIS — I10 Essential (primary) hypertension: Secondary | ICD-10-CM | POA: Diagnosis present

## 2023-08-14 NOTE — Patient Instructions (Signed)
 Medication Instructions:  Your physician recommends that you continue on your current medications as directed. Please refer to the Current Medication list given to you today.   *If you need a refill on your cardiac medications before your next appointment, please call your pharmacy*  Lab Work: None ordered at this time  If you have labs (blood work) drawn today and your tests are completely normal, you will receive your results only by: MyChart Message (if you have MyChart) OR A paper copy in the mail If you have any lab test that is abnormal or we need to change your treatment, we will call you to review the results.  Testing/Procedures: Your physician has requested that you have an echocardiogram. Echocardiography is a painless test that uses sound waves to create images of your heart. It provides your doctor with information about the size and shape of your heart and how well your heart's chambers and valves are working.   You may receive an ultrasound enhancing agent through an IV if needed to better visualize your heart during the echo. This procedure takes approximately one hour.  There are no restrictions for this procedure.  This will take place at [address] [facility] [address] [facility], [place] [address]  Please note: We ask at that you not bring children with you during ultrasound (echo/ vascular) testing. Due to room size and safety concerns, children are not allowed in the ultrasound rooms during exams. Our front office staff cannot provide observation of children in our lobby area while testing is being conducted. An adult accompanying a patient to their appointment will only be allowed in the ultrasound room at the discretion of the ultrasound technician under special circumstances. We apologize for any inconvenience.   Follow-Up: At [facility], you and your health needs are our priority.  As part of our continuing mission to provide you with exceptional  heart care, our providers are all part of one team.  This team includes your primary Cardiologist (physician) and Advanced Practice Providers or APPs (Physician Assistants and Nurse Practitioners) who all work together to provide you with the care you need, when you need it.  Your next appointment:   2-3 month(s)  Provider:   You may see Dr Redell Cave or one of the following Advanced Practice Providers on your designated Care Team:   Lonni Meager, NP Lesley Maffucci, PA-C Bernardino Bring, PA-C Cadence Camargo, PA-C Tylene Lunch, NP Barnie Hila, NP

## 2023-08-14 NOTE — Progress Notes (Signed)
 Cardiology Office Note:    Date:  08/14/2023   ID:  Chad Gill, DOB April 17, 1945, MRN [id number]  PCP:  [name], MD   [facility] Providers Cardiologist:  None     Referring MD: [name], MD   Chief Complaint  Patient presents with   Follow-up   Establish Care    New pt has been doing well with no complaints of chest pain, chest pressure or SOB, medciation reviewed verbally with patient    History of Present Illness:    Chad Gill is a 78 y.o. male with a hx of hypertension, hyperlipidemia, mild aortic valve stenosis, former smoker x 20 years who presents to establish care due to aortic stenosis.  Echo 2017 EF 55 to 60%, mild aortic valve stenosis.  He denies chest pain or shortness of breath.  Denies dizziness, leg edema, presyncope or syncope.  States checking blood pressure at home, not sure of BP numbers.  Compliant with medications as prescribed.  Past Medical History:  Diagnosis Date   Barrett's esophagus 2004   last EGD 2007, no metaplasia, due in 2009   Diabetes mellitus    Fatty liver disease, nonalcoholic    GERD (gastroesophageal reflux disease)    Hyperplastic colonic polyp 2004   Lumbar disc herniation 2007   L5   Transaminitis     Past Surgical History:  Procedure Laterality Date   COLONOSCOPY WITH PROPOFOL  N/A 12/11/2019   Procedure: COLONOSCOPY WITH PROPOFOL ;  Surgeon: Dessa Reyes ORN, MD;  Location: [facility];  Service: Endoscopy;  Laterality: N/A;   SMALL INTESTINE SURGERY     carcinoid tumore, diagnosis disputed   TONSILLECTOMY  age 88    Current Medications: Current Meds  Medication Sig   Ascorbic Acid (VITAMIN C PO) Take 500 mg by mouth.   aspirin  EC 81 MG tablet Take 1 tablet (81 mg total) by mouth daily. Swallow whole.   atorvastatin  (LIPITOR) 20 MG tablet TAKE 1 TABLET BY MOUTH EVERY DAY   fluticasone  (FLONASE ) 50 MCG/ACT nasal spray SPRAY 2 SPRAYS INTO EACH NOSTRIL EVERY DAY   glucose blood test  strip Use to check blood sugars once daily   Lancets (ONETOUCH ULTRASOFT) lancets Use as instructed   losartan  (COZAAR ) 100 MG tablet Take 1 tablet (100 mg total) by mouth daily.   metFORMIN  (GLUCOPHAGE ) 500 MG tablet Take 1 tablet (500 mg total) by mouth 2 (two) times daily with a meal.   omeprazole  (PRILOSEC) 20 MG capsule Take 1 capsule (20 mg total) by mouth 2 (two) times daily.   Semaglutide , 1 MG/DOSE, 4 MG/3ML SOPN Inject 1 mg as directed once a week.   sildenafil  (REVATIO ) 20 MG tablet TAKE 1 TABLET BY MOUTH THREE TIMES A DAY   Turmeric 400 MG CAPS Take 1 capsule by mouth daily.     Allergies:   Other and Influenza vac split [influenza virus vaccine]   Social History   Socioeconomic History   Marital status: Married    Spouse name: Not on file   Number of children: Not on file   Years of education: Not on file   Highest education level: GED or equivalent  Occupational History   Not on file  Tobacco Use   Smoking status: Former    Current packs/day: 0.00    Types: Cigarettes    Quit date: 01/21/1986    Years since quitting: 37.5   Smokeless tobacco: Never  Vaping Use   Vaping status: Never Used  Substance and Sexual Activity   Alcohol use: Yes    Comment: ocassional beer   Drug use: No   Sexual activity: Yes  Other Topics Concern   Not on file  Social History Narrative   Not on file   Social Drivers of Health   Financial Resource Strain: Patient Declined (08/04/2023)   Overall Financial Resource Strain (CARDIA)    Difficulty of Paying Living Expenses: Patient declined  Food Insecurity: Patient Declined (08/04/2023)   Hunger Vital Sign    Worried About Running Out of Food in the Last Year: Patient declined    Ran Out of Food in the Last Year: Patient declined  Transportation Needs: No Transportation Needs (08/04/2023)   PRAPARE - Administrator, Civil Service (Medical): No    Lack of Transportation (Non-Medical): No  Physical Activity: Unknown  (08/04/2023)   Exercise Vital Sign    Days of Exercise per Week: Patient declined    Minutes of Exercise per Session: Not on file  Stress: Patient Declined (08/04/2023)   [employer] of Occupational Health - Occupational Stress Questionnaire    Feeling of Stress: Patient declined  Social Connections: Unknown (08/04/2023)   Social Connection and Isolation Panel    Frequency of Communication with Friends and Family: Patient declined    Frequency of Social Gatherings with Friends and Family: Patient declined    Attends Religious Services: Patient declined    Database administrator or Organizations: Patient declined    Attends Engineer, structural: Not on file    Marital Status: Married     Family History: The patient's family history includes Hypertension in his mother.  ROS:   Please see the history of present illness.     All other systems reviewed and are negative.  EKGs/Labs/Other Studies Reviewed:    The following studies were reviewed today:  EKG Interpretation Date/Time:  Wednesday August 14 2023 11:11:29 EDT Ventricular Rate:  85 PR Interval:  152 QRS Duration:  98 QT Interval:  358 QTC Calculation: 426 R Axis:   -15  Text Interpretation: Normal sinus rhythm T wave abnormality, consider lateral ischemia Confirmed by Darliss Rogue ([id number]) on 08/14/2023 11:33:33 AM    Recent Labs: 05/08/2023: Hemoglobin 14.6; Platelets 221.0; TSH 1.62 08/08/2023: ALT 16; BUN 13; Creatinine, Ser 1.23; Potassium 4.3; Sodium 135  Recent Lipid Panel    Component Value Date/Time   CHOL 97 08/08/2023 1057   TRIG 114.0 08/08/2023 1057   HDL 31.80 (L) 08/08/2023 1057   CHOLHDL 3 08/08/2023 1057   VLDL 22.8 08/08/2023 1057   LDLCALC 43 08/08/2023 1057   LDLDIRECT 51.0 08/08/2023 1057     Risk Assessment/Calculations:              Physical Exam:    VS:  BP (!) 160/80 (BP Location: Left Arm, Patient Position: Sitting, Cuff Size: Normal) Comment: just got off the hwy  from [facility]  Pulse 85   Ht 5' 10 (1.778 m)   Wt 223 lb 12.8 oz (101.5 kg)   BMI 32.11 kg/m     Wt Readings from Last 3 Encounters:  08/14/23 223 lb 12.8 oz (101.5 kg)  08/08/23 223 lb 9.6 oz (101.4 kg)  05/08/23 224 lb (101.6 kg)     GEN:  Well nourished, well developed in no acute distress HEENT: Normal NECK: No JVD; No carotid bruits CARDIAC: RRR, 1/6 systolic murmur RESPIRATORY:  Clear to auscultation without rales, wheezing or rhonchi  ABDOMEN: Soft, non-tender, non-distended MUSCULOSKELETAL:  No edema; No deformity  SKIN: Warm and dry NEUROLOGIC:  Alert and oriented x 3 PSYCHIATRIC:  Normal affect   ASSESSMENT:    1. Aortic valve stenosis, etiology of cardiac valve disease unspecified   2. Primary hypertension   3. Pure hypercholesterolemia    PLAN:    In order of problems listed above:  Mild aortic valve stenosis on echo in 2017.  Repeat echocardiogram to evaluate any progression of aortic valve disease. Hypertension, BP elevated.  Continue losartan  100 mg daily at home.  Advised to check BP at home and keep a log, if BP elevated at follow-up visit inform patient, plan to add Norvasc to BP regimen. Hyperlipidemia, cholesterol controlled.  Continue Lipitor 20 mg daily.  Follow-up after echo      Medication Adjustments/Labs and Tests Ordered: Current medicines are reviewed at length with the patient today.  Concerns regarding medicines are outlined above.  Orders Placed This Encounter  Procedures   EKG 12-Lead   ECHOCARDIOGRAM COMPLETE   No orders of the defined types were placed in this encounter.   Patient Instructions  Medication Instructions:  Your physician recommends that you continue on your current medications as directed. Please refer to the Current Medication list given to you today.   *If you need a refill on your cardiac medications before your next appointment, please call your pharmacy*  Lab Work: None ordered at this time  If you have  labs (blood work) drawn today and your tests are completely normal, you will receive your results only by: MyChart Message (if you have MyChart) OR A paper copy in the mail If you have any lab test that is abnormal or we need to change your treatment, we will call you to review the results.  Testing/Procedures: Your physician has requested that you have an echocardiogram. Echocardiography is a painless test that uses sound waves to create images of your heart. It provides your doctor with information about the size and shape of your heart and how well your heart's chambers and valves are working.   You may receive an ultrasound enhancing agent through an IV if needed to better visualize your heart during the echo. This procedure takes approximately one hour.  There are no restrictions for this procedure.  This will take place at [address] [facility] [address] [facility], [place] [address]  Please note: We ask at that you not bring children with you during ultrasound (echo/ vascular) testing. Due to room size and safety concerns, children are not allowed in the ultrasound rooms during exams. Our front office staff cannot provide observation of children in our lobby area while testing is being conducted. An adult accompanying a patient to their appointment will only be allowed in the ultrasound room at the discretion of the ultrasound technician under special circumstances. We apologize for any inconvenience.   Follow-Up: At [facility], you and your health needs are our priority.  As part of our continuing mission to provide you with exceptional heart care, our providers are all part of one team.  This team includes your primary Cardiologist (physician) and Advanced Practice Providers or APPs (Physician Assistants and Nurse Practitioners) who all work together to provide you with the care you need, when you need it.  Your next appointment:   2-3 month(s)  Provider:   You may  see Dr Redell Cave or one of the following Advanced Practice Providers on your designated Care Team:   Lonni Meager, NP Lesley Maffucci, PA-C  Bernardino Bring, PA-C Cadence Furth, PA-C Tylene Lunch, NP Barnie Hila, NP    Signed, Redell Cave, MD  08/14/2023 12:07 PM    [facility]

## 2023-08-20 ENCOUNTER — Ambulatory Visit: Admitting: *Deleted

## 2023-08-20 VITALS — Ht 70.0 in | Wt 219.0 lb

## 2023-08-20 DIAGNOSIS — Z0000 Encounter for general adult medical examination without abnormal findings: Secondary | ICD-10-CM

## 2023-08-20 NOTE — Patient Instructions (Signed)
 Chad Gill , Thank you for taking time out of your busy schedule to complete your Annual Wellness Visit with me. I enjoyed our conversation and look forward to speaking with you again next year. I, as well as your care team,  appreciate your ongoing commitment to your health goals. Please review the following plan we discussed and let me know if I can assist you in the future. Your Game plan/ To Do List    Referrals: If you haven't heard from the office you've been referred to, please reach out to them at the phone provided.  Remember to keep the vaccines that you are able to take up to date. Follow up Visits: Next Medicare AWV with our clinical staff: 08/24/24 @ 1:00   Have you seen your provider in the last 6 months (3 months if uncontrolled diabetes)? Yes Next Office Visit with your provider: 11/12/23  Clinician Recommendations:  Aim for 30 minutes of exercise or brisk walking, 6-8 glasses of water, and 5 servings of fruits and vegetables each day.       This is a list of the screening recommended for you and due dates:  Health Maintenance  Topic Date Due   Zoster (Shingles) Vaccine (1 of 2) Never done   Colon Cancer Screening  12/11/2022   Eye exam for diabetics  06/07/2023   COVID-19 Vaccine (1 - 2024-25 season) 08/24/2023*   Flu Shot  09/20/2023   Hemoglobin A1C  02/07/2024   Yearly kidney health urinalysis for diabetes  05/07/2024   Yearly kidney function blood test for diabetes  08/07/2024   Complete foot exam   08/07/2024   Medicare Annual Wellness Visit  08/19/2024   DTaP/Tdap/Td vaccine (3 - Td or Tdap) 09/20/2032   Pneumococcal Vaccine for age over 49  Completed   Hepatitis B Vaccine  Completed   Hepatitis C Screening  Completed   HPV Vaccine  Aged Out   Meningitis B Vaccine  Aged Out  *Topic was postponed. The date shown is not the original due date.    Advanced directives: (Copy Requested) Please bring a copy of your health care power of attorney and living will to the  office to be added to your chart at your convenience. You can mail to [facility] [address] [place], [place] [address] or email to [email or web address] Advance Care Planning is important because it:  [x]  Makes sure you receive the medical care that is consistent with your values, goals, and preferences  [x]  It provides guidance to your family and loved ones and reduces their decisional burden about whether or not they are making the right decisions based on your wishes.

## 2023-08-20 NOTE — Progress Notes (Signed)
 Subjective:   Chad Gill is a 78 y.o. who presents for a Medicare Wellness preventive visit.  As a reminder, Annual Wellness Visits don't include a physical exam, and some assessments may be limited, especially if this visit is performed virtually. We may recommend an in-person follow-up visit with your provider if needed.  Visit Complete: Virtual I connected with  Chad Gill on 08/20/23 by a audio enabled telemedicine application and verified that I am speaking with the correct person using two identifiers.  Patient Location: Home  Provider Location: Home Office  I discussed the limitations of evaluation and management by telemedicine. The patient expressed understanding and agreed to proceed.  Vital Signs: Because this visit was a virtual/telehealth visit, some criteria may be missing or patient reported. Any vitals not documented were not able to be obtained and vitals that have been documented are patient reported.  VideoDeclined- This patient declined Librarian, academic. Therefore the visit was completed with audio only.  Persons Participating in Visit: Patient.  AWV Questionnaire: No: Patient Medicare AWV questionnaire was not completed prior to this visit.  Cardiac Risk Factors include: advanced age (>39men, >90 women);diabetes mellitus;male gender;hypertension;dyslipidemia;obesity (BMI >30kg/m2)     Objective:    Today's Vitals   08/20/23 1304  Weight: 219 lb (99.3 kg)  Height: 5' 10 (1.778 m)   Body mass index is 31.42 kg/m.     08/20/2023    1:17 PM 12/11/2019    7:06 AM 05/28/2019    1:35 PM 05/06/2019   11:42 AM 10/14/2015    2:27 PM  Advanced Directives  Does Patient Have a Medical Advance Directive? Yes No No No No   Type of Estate agent of [place];Living will      Copy of Healthcare Power of Attorney in Chart? No - copy requested      Would patient like information on creating a medical advance  directive?  No - Patient declined   Yes - Educational materials given      Data saved with a previous flowsheet row definition    Current Medications (verified) Outpatient Encounter Medications as of 08/20/2023  Medication Sig   Ascorbic Acid (VITAMIN C PO) Take 500 mg by mouth.   aspirin  EC 81 MG tablet Take 1 tablet (81 mg total) by mouth daily. Swallow whole.   atorvastatin  (LIPITOR) 20 MG tablet TAKE 1 TABLET BY MOUTH EVERY DAY   glucose blood test strip Use to check blood sugars once daily   Lancets (ONETOUCH ULTRASOFT) lancets Use as instructed   losartan  (COZAAR ) 100 MG tablet Take 1 tablet (100 mg total) by mouth daily.   metFORMIN  (GLUCOPHAGE ) 500 MG tablet Take 1 tablet (500 mg total) by mouth 2 (two) times daily with a meal.   omeprazole  (PRILOSEC) 20 MG capsule Take 1 capsule (20 mg total) by mouth 2 (two) times daily.   Semaglutide , 1 MG/DOSE, 4 MG/3ML SOPN Inject 1 mg as directed once a week.   sildenafil  (REVATIO ) 20 MG tablet TAKE 1 TABLET BY MOUTH THREE TIMES A DAY   Turmeric 400 MG CAPS Take 1 capsule by mouth daily.   fluticasone  (FLONASE ) 50 MCG/ACT nasal spray SPRAY 2 SPRAYS INTO EACH NOSTRIL EVERY DAY (Patient not taking: Reported on 08/20/2023)   No facility-administered encounter medications on file as of 08/20/2023.    Allergies (verified) Other and Influenza vac split [influenza virus vaccine]   History: Past Medical History:  Diagnosis Date   Barrett's esophagus 2004  last EGD 2007, no metaplasia, due in 2009   Diabetes mellitus    Fatty liver disease, nonalcoholic    GERD (gastroesophageal reflux disease)    Hyperplastic colonic polyp 2004   Lumbar disc herniation 2007   L5   Transaminitis    Past Surgical History:  Procedure Laterality Date   COLONOSCOPY WITH PROPOFOL  N/A 12/11/2019   Procedure: COLONOSCOPY WITH PROPOFOL ;  Surgeon: Dessa Reyes ORN, MD;  Location: [facility] ENDOSCOPY;  Service: Endoscopy;  Laterality: N/A;   SMALL INTESTINE SURGERY      carcinoid tumore, diagnosis disputed   TONSILLECTOMY  age 60   Family History  Problem Relation Age of Onset   Hypertension Mother    Social History   Socioeconomic History   Marital status: Married    Spouse name: Not on file   Number of children: Not on file   Years of education: Not on file   Highest education level: GED or equivalent  Occupational History   Not on file  Tobacco Use   Smoking status: Former    Current packs/day: 0.00    Types: Cigarettes    Quit date: 01/21/1986    Years since quitting: 37.6   Smokeless tobacco: Never  Vaping Use   Vaping status: Never Used  Substance and Sexual Activity   Alcohol use: Yes    Comment: ocassional beer   Drug use: No   Sexual activity: Yes  Other Topics Concern   Not on file  Social History Narrative   married   Social Drivers of Corporate investment banker Strain: Low Risk  (08/20/2023)   Overall Financial Resource Strain (CARDIA)    Difficulty of Paying Living Expenses: Not hard at all  Food Insecurity: No Food Insecurity (08/20/2023)   Hunger Vital Sign    Worried About Running Out of Food in the Last Year: Never true    Ran Out of Food in the Last Year: Never true  Transportation Needs: No Transportation Needs (08/20/2023)   PRAPARE - Administrator, Civil Service (Medical): No    Lack of Transportation (Non-Medical): No  Physical Activity: Insufficiently Active (08/20/2023)   Exercise Vital Sign    Days of Exercise per Week: 3 days    Minutes of Exercise per Session: 20 min  Stress: No Stress Concern Present (08/20/2023)   [employer] of Occupational Health - Occupational Stress Questionnaire    Feeling of Stress: Not at all  Social Connections: Moderately Integrated (08/20/2023)   Social Connection and Isolation Panel    Frequency of Communication with Friends and Family: Three times a week    Frequency of Social Gatherings with Friends and Family: Once a week    Attends Religious Services:  More than 4 times per year    Active Member of [employer] or Organizations: No    Attends Engineer, structural: Never    Marital Status: Married    Tobacco Counseling Counseling given: Not Answered    Clinical Intake:  Pre-visit preparation completed: Yes  Pain : No/denies pain     BMI - recorded: 31.42 Nutritional Status: BMI > 30  Obese Nutritional Risks: None Diabetes: Yes CBG done?: No Did pt. bring in CBG monitor from home?: No  Lab Results  Component Value Date   HGBA1C 6.6 (H) 08/08/2023   HGBA1C 7.1 (H) 05/08/2023   HGBA1C 7.4 (H) 11/06/2022     How often do you need to have someone help you when you read instructions, pamphlets, or  other written materials from your doctor or pharmacy?: 1 - Never  Interpreter Needed?: No  Information entered by :: R. Sevanna Ballengee LPN   Activities of Daily Living     08/20/2023    1:06 PM  In your present state of health, do you have any difficulty performing the following activities:  Hearing? 1  Comment wears aids  Vision? 0  Comment glasses  Difficulty concentrating or making decisions? 0  Walking or climbing stairs? 0  Dressing or bathing? 0  Doing errands, shopping? 0  Preparing Food and eating ? N  Using the Toilet? N  In the past six months, have you accidently leaked urine? N  Do you have problems with loss of bowel control? N  Managing your Medications? N  Managing your Finances? N  Housekeeping or managing your Housekeeping? N    Patient Care Team: [name], MD as PCP - General (Internal Medicine)  I have updated your Care Teams any recent Medical Services you may have received from other providers in the past year.     Assessment:   This is a routine wellness examination for Chad Gill.  Hearing/Vision screen Hearing Screening - Comments:: Wears aids Vision Screening - Comments:: glasses   Goals Addressed             This Visit's Progress    Patient Stated       Wants to continue to  stay active and eat healthy       Depression Screen     08/20/2023    1:13 PM 08/08/2023   10:24 AM 05/08/2023   10:26 AM 11/06/2022    1:58 PM 07/18/2022   12:07 PM 04/18/2022    2:07 PM 11/29/2021   11:06 AM  PHQ 2/9 Scores  PHQ - 2 Score 0 0 0 0 0 0 0  PHQ- 9 Score 0    0      Fall Risk     08/20/2023    1:08 PM 08/08/2023   10:24 AM 05/08/2023   10:26 AM 11/06/2022    1:58 PM 07/18/2022   12:07 PM  Fall Risk   Falls in the past year? 0 0 0 1 0  Number falls in past yr: 0 0 0 0 0  Injury with Fall? 0 0 0 1 0  Risk for fall due to : No Fall Risks No Fall Risks No Fall Risks History of fall(s) No Fall Risks  Follow up Falls evaluation completed;Falls prevention discussed Falls evaluation completed Falls evaluation completed Falls evaluation completed Falls evaluation completed    MEDICARE RISK AT HOME:  Medicare Risk at Home Any stairs in or around the home?: Yes If so, are there any without handrails?: No Home free of loose throw rugs in walkways, pet beds, electrical cords, etc?: Yes Adequate lighting in your home to reduce risk of falls?: Yes Life alert?: No Use of a cane, walker or w/c?: No Grab bars in the bathroom?: Yes Shower chair or bench in shower?: No Elevated toilet seat or a handicapped toilet?: No  TIMED UP AND GO:  Was the test performed?  No  Cognitive Function: 6CIT completed    10/14/2015    2:40 PM  MMSE - Mini Mental State Exam  Orientation to time 5   Orientation to Place 5   Registration 3   Attention/ Calculation 5   Recall 3   Language- name 2 objects 2   Language- repeat 1  Language- follow 3 step command  3   Language- read & follow direction 1   Write a sentence 1   Copy design 1   Total score 30      Data saved with a previous flowsheet row definition        08/20/2023    1:17 PM  6CIT Screen  What Year? 0 points  What month? 0 points  What time? 0 points  Count back from 20 0 points  Months in reverse 2 points  Repeat  phrase 0 points  Total Score 2 points    Immunizations Immunization History  Administered Date(s) Administered   Hep A / Hep B 09/10/2013, 10/05/2013, 04/08/2014   Pneumococcal Conjugate-13 06/03/2013   Pneumococcal Polysaccharide-23 01/30/2012, 04/25/2017   Tdap 01/22/2011, 09/21/2022    Screening Tests Health Maintenance  Topic Date Due   Zoster Vaccines- Shingrix (1 of 2) Never done   Medicare Annual Wellness (AWV)  10/05/2017   Colonoscopy  12/11/2022   OPHTHALMOLOGY EXAM  06/07/2023   COVID-19 Vaccine (1 - 2024-25 season) 08/24/2023 (Originally 10/21/2022)   INFLUENZA VACCINE  09/20/2023   HEMOGLOBIN A1C  02/07/2024   Diabetic kidney evaluation - Urine ACR  05/07/2024   Diabetic kidney evaluation - eGFR measurement  08/07/2024   FOOT EXAM  08/07/2024   DTaP/Tdap/Td (3 - Td or Tdap) 09/20/2032   Pneumococcal Vaccine: 50+ Years  Completed   Hepatitis B Vaccines  Completed   Hepatitis C Screening  Completed   HPV VACCINES  Aged Out   Meningococcal B Vaccine  Aged Out    Health Maintenance  Health Maintenance Due  Topic Date Due   Zoster Vaccines- Shingrix (1 of 2) Never done   Medicare Annual Wellness (AWV)  10/05/2017   Colonoscopy  12/11/2022   OPHTHALMOLOGY EXAM  06/07/2023   Health Maintenance Items Addressed: Patient stated that he is unable to take the shingles vaccines. Patient stated that he will discuss colonoscopy with his PCP at next visit because of his age  Additional Screening:  Vision Screening: Recommended annual ophthalmology exams for early detection of glaucoma and other disorders of the eye. Up to date [facility]  Will send for last office notes Would you like a referral to an eye doctor? No    Dental Screening: Recommended annual dental exams for proper oral hygiene  Community Resource Referral / Chronic Care Management: CRR required this visit?  No   CCM required this visit?  No   Plan:    I have personally reviewed and noted the  following in the patient's chart:   Medical and social history Use of alcohol, tobacco or illicit drugs  Current medications and supplements including opioid prescriptions. Patient is not currently taking opioid prescriptions. Functional ability and status Nutritional status Physical activity Advanced directives List of other physicians Hospitalizations, surgeries, and ER visits in previous 12 months Vitals Screenings to include cognitive, depression, and falls Referrals and appointments  In addition, I have reviewed and discussed with patient certain preventive protocols, quality metrics, and best practice recommendations. A written personalized care plan for preventive services as well as general preventive health recommendations were provided to patient.   Angeline Fredericks, LPN   03/29/7972   After Visit Summary: (MyChart) Due to this being a telephonic visit, the after visit summary with patients personalized plan was offered to patient via MyChart   Notes: Nothing significant to report at this time.

## 2023-08-22 ENCOUNTER — Other Ambulatory Visit: Payer: Self-pay | Admitting: Internal Medicine

## 2023-08-22 DIAGNOSIS — I10 Essential (primary) hypertension: Secondary | ICD-10-CM

## 2023-08-22 NOTE — Telephone Encounter (Signed)
 Opened in error

## 2023-08-27 ENCOUNTER — Ambulatory Visit: Attending: Cardiology

## 2023-08-27 DIAGNOSIS — I350 Nonrheumatic aortic (valve) stenosis: Secondary | ICD-10-CM | POA: Diagnosis present

## 2023-08-27 LAB — ECHOCARDIOGRAM COMPLETE
AR max vel: 1.12 cm2
AV Area VTI: 1.16 cm2
AV Area mean vel: 1.12 cm2
AV Mean grad: 36 mmHg
AV Peak grad: 62.1 mmHg
Ao pk vel: 3.94 m/s
Area-P 1/2: 3.31 cm2
Calc EF: 63.2 %
S' Lateral: 3.6 cm
Single Plane A2C EF: 62.9 %
Single Plane A4C EF: 68.6 %

## 2023-08-28 ENCOUNTER — Ambulatory Visit: Payer: Self-pay | Admitting: Cardiology

## 2023-08-28 DIAGNOSIS — I350 Nonrheumatic aortic (valve) stenosis: Secondary | ICD-10-CM

## 2023-08-29 ENCOUNTER — Ambulatory Visit
Admission: RE | Admit: 2023-08-29 | Discharge: 2023-08-29 | Disposition: A | Discharge status: Home / Self Care | Source: Ambulatory Visit | Attending: Internal Medicine | Admitting: Internal Medicine

## 2023-08-29 DIAGNOSIS — R053 Chronic cough: Secondary | ICD-10-CM | POA: Diagnosis present

## 2023-09-01 DIAGNOSIS — I2510 Atherosclerotic heart disease of native coronary artery without angina pectoris: Secondary | ICD-10-CM | POA: Insufficient documentation

## 2023-09-01 NOTE — Assessment & Plan Note (Addendum)
 Asymptomatic, noted on Chest CT. He is adherent to statin therapy and LDL is < 70.  Recommend continued  use of GLP 1 agonist, statin and baby aspirin ; and semi annual follow up with Dr Wynne  )(last visit June 2025)

## 2023-09-09 ENCOUNTER — Encounter: Payer: Self-pay | Admitting: Internal Medicine

## 2023-09-09 NOTE — Telephone Encounter (Signed)
 Called and spoke to pharmacy and pt to let him know that he has 1 rx ready and 2 more refills.

## 2023-10-02 ENCOUNTER — Encounter: Payer: Self-pay | Admitting: Internal Medicine

## 2023-10-03 ENCOUNTER — Encounter: Payer: Self-pay | Admitting: Pulmonary Disease

## 2023-10-03 ENCOUNTER — Other Ambulatory Visit
Admission: RE | Admit: 2023-10-03 | Discharge: 2023-10-03 | Disposition: A | Discharge status: Home / Self Care | Source: Ambulatory Visit | Attending: Pulmonary Disease | Admitting: Pulmonary Disease

## 2023-10-03 ENCOUNTER — Encounter: Payer: Self-pay | Admitting: Internal Medicine

## 2023-10-03 ENCOUNTER — Ambulatory Visit: Admitting: Pulmonary Disease

## 2023-10-03 VITALS — BP 160/96 | HR 89 | Temp 98.3°F | Ht 70.0 in | Wt 216.0 lb

## 2023-10-03 DIAGNOSIS — R0602 Shortness of breath: Secondary | ICD-10-CM

## 2023-10-03 DIAGNOSIS — I350 Nonrheumatic aortic (valve) stenosis: Secondary | ICD-10-CM

## 2023-10-03 DIAGNOSIS — R911 Solitary pulmonary nodule: Secondary | ICD-10-CM | POA: Diagnosis not present

## 2023-10-03 DIAGNOSIS — J920 Pleural plaque with presence of asbestos: Secondary | ICD-10-CM | POA: Diagnosis not present

## 2023-10-03 DIAGNOSIS — J439 Emphysema, unspecified: Secondary | ICD-10-CM | POA: Diagnosis not present

## 2023-10-03 DIAGNOSIS — R053 Chronic cough: Secondary | ICD-10-CM | POA: Diagnosis not present

## 2023-10-03 LAB — NITRIC OXIDE: Nitric Oxide: 12

## 2023-10-03 MED ORDER — LEVALBUTEROL TARTRATE 45 MCG/ACT IN AERO: 2.0000 | INHALATION_SPRAY | Freq: Four times a day (QID) | RESPIRATORY_TRACT | 2 refills | Status: AC | PRN

## 2023-10-03 NOTE — Progress Notes (Signed)
 Subjective:    Patient ID: Chad Gill, male    DOB: 1945/08/09, 78 y.o.   MRN: [id number]  Patient Care Team: Chad Gill CROME, MD as PCP - General (Internal Medicine) Pa, [facility])  Chief Complaint  Patient presents with   Consult    Occasional cough.     BACKGROUND: Patient is a 78 year old remote former smoker with a history as noted below, who presents for evaluation of a persistent cough and abnormal chest CT findings.  He is kindly referred by Dr. Verneita Chad.  HPI Discussed the use of AI scribe software for clinical note transcription with the patient, who gave verbal consent to proceed.  History of Present Illness   Chad Gill is a 78 year old male with a past history of smoking and asbestos exposure who presents with a cough and abnormal CT scan findings.  He is kindly referred by Dr. Verneita Chad.  He presents today with his wife Chad Gill.  He has an intermittent cough without shortness of breath or wheezing. Occasionally, he produces yellowish phlegm. No recent upper respiratory infections or viral illnesses.  Cough started approximately 3 to 4 weeks ago.  No precipitating factors.  No hemoptysis.  He has a significant smoking history, having smoked about a pack a day for approximately fifteen years, but he quit a long time ago. 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 He was not exposed to [employer] but has a history of asbestos exposure.  His CT scan shows emphysema and a ground glass nodule, which requires follow-up. There is also evidence of ganulomas and pleural plaques, likely related to his asbestos exposure.  He is currently on losartan  for hypertension, which he has been taking for about a year. No family history of emphysema. He reports no issues with sleep and does not snore much.   Does not endorse  gastroesophageal reflux symptoms.  Does not endorse any other symptomatology.    Review of Systems A 10 point review of systems was performed and it is as noted above otherwise negative.   Past Medical History:  Diagnosis Date   Barrett's esophagus 2004   last EGD 2007, no metaplasia, due in 2009   Diabetes mellitus    Fatty liver disease, nonalcoholic    GERD (gastroesophageal reflux disease)    Hyperplastic colonic polyp 2004   Lumbar disc herniation 2007   L5   Transaminitis     Past Surgical History:  Procedure Laterality Date   COLONOSCOPY WITH PROPOFOL  N/A 12/11/2019   Procedure: COLONOSCOPY WITH PROPOFOL ;  Surgeon: Dessa Reyes ORN, MD;  Location: [facility] ENDOSCOPY;  Service: Endoscopy;  Laterality: N/A;   SMALL INTESTINE SURGERY     carcinoid tumore, diagnosis disputed   TONSILLECTOMY  age 60    Patient Active Problem List   Diagnosis Date Noted   Coronary artery disease due to calcified coronary lesion 09/01/2023   Cerebrovascular disease, unspecified 11/06/2022   Erectile dysfunction 11/29/2021   Aortic valve stenosis 04/02/2021   Advice given about COVID-19 virus by telephone 03/09/2019   Persistent cough for 3 weeks or longer 01/16/2016   Hyperlipidemia associated with type 2 diabetes mellitus (HCC) 04/16/2015   Liver cirrhosis secondary to NASH (HCC) 01/17/2015   Medicare annual wellness visit, subsequent 10/12/2014   Uses hearing aid 09/10/2013   Tinnitus of both ears 06/03/2013  Obesity, diabetes, and hypertension syndrome (HCC) 08/26/2012   Hypertension 01/30/2012   Obesity (BMI 30-39.9) 07/24/2011   GERD (gastroesophageal reflux disease)    Adenomatous polyps     Family History  Problem Relation Age of Onset   Hypertension Mother     Social History   Tobacco Use   Smoking status: Former    Current packs/day: 0.00    Types: Cigarettes    Quit date: 01/21/1986    Years since quitting: 37.7   Smokeless tobacco: Never  Substance Use Topics    Alcohol use: Yes    Comment: ocassional beer    Allergies  Allergen Reactions   Other Hives    Flu vaccine   Influenza Vac Split [Influenza Virus Vaccine] Hives    Current Meds  Medication Sig   Ascorbic Acid (VITAMIN C PO) Take 500 mg by mouth.   aspirin  EC 81 MG tablet Take 1 tablet (81 mg total) by mouth daily. Swallow whole.   atorvastatin  (LIPITOR) 20 MG tablet TAKE 1 TABLET BY MOUTH EVERY DAY   glucose blood test strip Use to check blood sugars once daily   Lancets (ONETOUCH ULTRASOFT) lancets Use as instructed   levalbuterol  (XOPENEX  HFA) 45 MCG/ACT inhaler Inhale 2 puffs into the lungs every 6 (six) hours as needed for wheezing or shortness of breath (cough).   losartan  (COZAAR ) 100 MG tablet TAKE 1 TABLET BY MOUTH EVERY DAY   metFORMIN  (GLUCOPHAGE ) 500 MG tablet Take 1 tablet (500 mg total) by mouth 2 (two) times daily with a meal.   omeprazole  (PRILOSEC) 20 MG capsule TAKE 1 CAPSULE BY MOUTH TWICE A DAY   Semaglutide , 1 MG/DOSE, 4 MG/3ML SOPN Inject 1 mg as directed once a week.   sildenafil  (REVATIO ) 20 MG tablet TAKE 1 TABLET BY MOUTH THREE TIMES A DAY   Turmeric 400 MG CAPS Take 1 capsule by mouth daily.    Immunization History  Administered Date(s) Administered   Hep A / Hep B 09/10/2013, 10/05/2013, 04/08/2014   Pneumococcal Conjugate-13 06/03/2013   Pneumococcal Polysaccharide-23 01/30/2012, 04/25/2017   Tdap 01/22/2011, 09/21/2022        Objective:     BP (!) 160/96 (BP Location: Left Arm, Patient Position: Sitting, Cuff Size: Normal)   Pulse 89   Temp 98.3 F (36.8 C) (Oral)   Ht 5' 10 (1.778 m)   Wt 216 lb (98 kg)   SpO2 96%   BMI 30.99 kg/m   SpO2: 96 %  GENERAL: Well-developed, overweight gentleman, no acute distress.  No conversational dyspnea.  Fully ambulatory. HEAD: Normocephalic, atraumatic.  EYES: Pupils equal, round, reactive to light.  No scleral icterus.  EARS: Wears hearing aids. MOUTH: Edentulous, wears dentures.  Pharynx clear.   No thrush. NECK: Supple. No thyromegaly. Trachea midline. No JVD.  No adenopathy. PULMONARY: Good air entry bilaterally.  No adventitious sounds. CARDIOVASCULAR: S1 and S2. Regular rate and rhythm.  Grade 3/6 aortic stenosis murmur. ABDOMEN: Benign. MUSCULOSKELETAL: No joint deformity, no clubbing, no edema.  NEUROLOGIC: No overt focal deficit, no gait disturbance, speech is fluent. SKIN: Intact,warm,dry. PSYCH: Mood and behavior normal.    Chest CT of 29 August 2023 independently reviewed with findings as follows:  Pulmonary emphysema, ground glass nodule right upper lobe, 7 mm, no central component (arrow)   Pulmonary emphysema and granuloma on the right upper lobe (arrow)   Pleural plaquing on the left (arrow)    Lab Results  Component Value Date   NITRICOXIDE 12 10/03/2023  *No  evidence of type II inflammation   Assessment & Plan:     ICD-10-CM   1. Persistent cough for 3 weeks or longer  R05.3 Nitric oxide     Pulmonary function test    2. Pulmonary emphysema, unspecified emphysema type (HCC)  J43.9 Alpha-1 antitrypsin phenotype    3. Lung nodule seen on imaging study  R91.1 CT CHEST WO CONTRAST    4. Pleural plaque due to asbestos exposure  J92.0     5. Shortness of breath  R06.02 Pulmonary function test    6. Nonrheumatic aortic valve stenosis  I35.0       Orders Placed This Encounter  Procedures   CT CHEST WO CONTRAST    Standing Status:   Future    Expected Date:   04/04/2024    Expiration Date:   10/02/2024    Preferred imaging location?:   [facility]   Alpha-1 antitrypsin phenotype    Standing Status:   Future    Expiration Date:   10/02/2024   Nitric oxide    Pulmonary function test    Standing Status:   Future    Expiration Date:   10/02/2024    Where should this test be performed?:   Outpatient Pulmonary    What type of PFT is being ordered?:   Full PFT    Meds ordered this encounter  Medications   levalbuterol  (XOPENEX  HFA) 45 MCG/ACT  inhaler    Sig: Inhale 2 puffs into the lungs every 6 (six) hours as needed for wheezing or shortness of breath (cough).    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 CT scan shows significant emphysema in both lungs. No current dyspnea. Low airway inflammation level suggests cough is not due to asthma. Possible association with emphysema or losartan  use, though less likely. - Schedule detailed pulmonary function tests to assess emphysema severity. - Order alpha-1 antitrypsin, in view of emphysema. - Prescribe inhaler for use as needed for severe coughing spells (albuterol  for as needed use).  Pulmonary ground glass nodule, left lung CT scan reveals a ground glass nodule in the left lung. Differential diagnosis includes inflammation or other benign causes. No solid mass present for biopsy. - Schedule follow-up CT scan in six months to monitor the nodule for changes in size or density.  Pleural plaque, left lung due to asbestos exposure CT scan shows pleural plaque on the left lung, likely due to past asbestos exposure. No risk of malignancy associated with the plaque.     Aortic valve stenosis Patient follows with cardiology.  No evidence of decompensation.  Continue follow-up with cardiology  Will see the patient in 2-3 months time he is to contact us  prior to that time.  New difficulties arise.   Advised if symptoms do not improve or worsen, to please contact office for sooner follow up or seek emergency care.    I spent 60 minutes of dedicated to the care of this patient on the date of this encounter to include pre-visit review of records, face-to-face time with the patient discussing conditions above, post visit ordering of testing, clinical documentation with the electronic health record, making appropriate referrals as documented, and communicating necessary findings to  members of the patients care team.   C. Leita Sanders, MD Advanced Bronchoscopy PCCM [facility]    *This note was dictated using voice recognition software/Dragon.  Despite best efforts to proofread, errors can occur which can change the meaning. Any transcriptional errors that result from this process are unintentional and may not be fully corrected at the time of dictation.

## 2023-10-03 NOTE — Telephone Encounter (Signed)
 Form placed In folder for review

## 2023-10-03 NOTE — Patient Instructions (Signed)
 VISIT SUMMARY:  Today, you were seen for a cough and abnormal CT scan findings. You have a history of smoking and asbestos exposure, and your CT scan shows emphysema, a ground glass nodule, and pleural plaques. We discussed the findings and the next steps for your care.  YOUR PLAN:  -CHRONIC OBSTRUCTIVE PULMONARY DISEASE WITH EMPHYSEMA: Chronic obstructive pulmonary disease (COPD) with emphysema is a lung condition that makes it hard to breathe due to damage to the air sacs in the lungs. We will schedule detailed pulmonary function tests to assess the severity of your emphysema and order a blood test to evaluate it further. You will also be prescribed an inhaler to use as needed for severe coughing spells.  -PULMONARY GROUND GLASS NODULE, LEFT LUNG: A ground glass nodule is a hazy area seen on a CT scan that can be due to inflammation or other benign causes. We will schedule a follow-up CT scan in six months to monitor the nodule for any changes in size or density.  -PLEURAL PLAQUE, LEFT LUNG DUE TO ASBESTOS EXPOSURE: A pleural plaque is a thickened area on the lining of the lung, often due to asbestos exposure. It is not associated with a risk of cancer. No further action is needed for this issue at this time.  INSTRUCTIONS:  Please schedule the pulmonary function tests and blood test as soon as possible. Additionally, make sure to schedule a follow-up CT scan in six months to monitor the ground glass nodule. Use the prescribed inhaler as needed for severe coughing spells.

## 2023-10-04 NOTE — Telephone Encounter (Signed)
 Copy made for chart, placed in scan.  Mychart message sent to pt letting him know that form is ready for pick up.

## 2023-10-07 ENCOUNTER — Ambulatory Visit: Payer: Self-pay | Admitting: Pulmonary Disease

## 2023-10-07 LAB — ALPHA-1-ANTITRYPSIN PHENOTYP: A-1 Antitrypsin, Ser: 176 mg/dL (ref 101–187)

## 2023-10-14 ENCOUNTER — Encounter: Payer: Self-pay | Admitting: Cardiology

## 2023-10-14 ENCOUNTER — Ambulatory Visit: Attending: Cardiology | Admitting: Cardiology

## 2023-10-14 VITALS — BP 126/64 | HR 86 | Ht 70.0 in | Wt 213.4 lb

## 2023-10-14 DIAGNOSIS — I10 Essential (primary) hypertension: Secondary | ICD-10-CM | POA: Insufficient documentation

## 2023-10-14 DIAGNOSIS — E7800 Pure hypercholesterolemia, unspecified: Secondary | ICD-10-CM | POA: Diagnosis not present

## 2023-10-14 DIAGNOSIS — I350 Nonrheumatic aortic (valve) stenosis: Secondary | ICD-10-CM | POA: Insufficient documentation

## 2023-10-14 NOTE — Progress Notes (Signed)
 Cardiology Office Note:    Date:  10/14/2023   ID:  Chad Gill, DOB 1945/06/01, MRN [id number]  PCP:  [name], MD   [facility] Providers Cardiologist:  None     Referring MD: [name], MD   Chief Complaint  Patient presents with   Follow-up    1 month follow up after Echo, pt has been doing well with no complaints of chest pain, chest pressure or SOB, medciation reviewed verbally with patient    History of Present Illness:    Chad Gill is a 78 y.o. male with a hx of hypertension, hyperlipidemia, aortic valve stenosis, former smoker x 20 years who presents for follow-up.  Previously seen due to history of mild aortic valve stenosis from echocardiogram in 2017.  Repeat echo was obtained to evaluate any progression of valvular disease.  Patient denies chest pain, shortness of breath, edema, presyncope or syncope.  He feels well, presents for echocardiogram results, has no concerns at this time.  Prior notes/testing Echo 2017 EF 55 to 60%, mild aortic valve stenosis.  Past Medical History:  Diagnosis Date   Barrett's esophagus 2004   last EGD 2007, no metaplasia, due in 2009   Diabetes mellitus    Fatty liver disease, nonalcoholic    GERD (gastroesophageal reflux disease)    Hyperplastic colonic polyp 2004   Lumbar disc herniation 2007   L5   Transaminitis     Past Surgical History:  Procedure Laterality Date   COLONOSCOPY WITH PROPOFOL  N/A 12/11/2019   Procedure: COLONOSCOPY WITH PROPOFOL ;  Surgeon: Dessa Reyes ORN, MD;  Location: [facility] ENDOSCOPY;  Service: Endoscopy;  Laterality: N/A;   SMALL INTESTINE SURGERY     carcinoid tumore, diagnosis disputed   TONSILLECTOMY  age 66    Current Medications: Current Meds  Medication Sig   Ascorbic Acid (VITAMIN C PO) Take 500 mg by mouth.   aspirin  EC 81 MG tablet Take 1 tablet (81 mg total) by mouth daily. Swallow whole.   atorvastatin  (LIPITOR) 20 MG tablet TAKE 1 TABLET BY MOUTH  EVERY DAY   fluticasone  (FLONASE ) 50 MCG/ACT nasal spray SPRAY 2 SPRAYS INTO EACH NOSTRIL EVERY DAY   glucose blood test strip Use to check blood sugars once daily   Lancets (ONETOUCH ULTRASOFT) lancets Use as instructed   levalbuterol  (XOPENEX  HFA) 45 MCG/ACT inhaler Inhale 2 puffs into the lungs every 6 (six) hours as needed for wheezing or shortness of breath (cough).   losartan  (COZAAR ) 100 MG tablet TAKE 1 TABLET BY MOUTH EVERY DAY   metFORMIN  (GLUCOPHAGE ) 500 MG tablet Take 1 tablet (500 mg total) by mouth 2 (two) times daily with a meal.   omeprazole  (PRILOSEC) 20 MG capsule TAKE 1 CAPSULE BY MOUTH TWICE A DAY   Semaglutide , 1 MG/DOSE, 4 MG/3ML SOPN Inject 1 mg as directed once a week.   sildenafil  (REVATIO ) 20 MG tablet TAKE 1 TABLET BY MOUTH THREE TIMES A DAY   Turmeric 400 MG CAPS Take 1 capsule by mouth daily.     Allergies:   Other and Influenza vac split [influenza virus vaccine]   Social History   Socioeconomic History   Marital status: Married    Spouse name: Not on file   Number of children: Not on file   Years of education: Not on file   Highest education level: GED or equivalent  Occupational History   Not on file  Tobacco Use   Smoking status: Former  Current packs/day: 0.00    Types: Cigarettes    Quit date: 01/21/1986    Years since quitting: 37.7   Smokeless tobacco: Never  Vaping Use   Vaping status: Never Used  Substance and Sexual Activity   Alcohol use: Yes    Comment: ocassional beer   Drug use: No   Sexual activity: Yes  Other Topics Concern   Not on file  Social History Narrative   married   Social Drivers of Corporate investment banker Strain: Low Risk  (08/20/2023)   Overall Financial Resource Strain (CARDIA)    Difficulty of Paying Living Expenses: Not hard at all  Food Insecurity: No Food Insecurity (08/20/2023)   Hunger Vital Sign    Worried About Running Out of Food in the Last Year: Never true    Ran Out of Food in the Last Year:  Never true  Transportation Needs: No Transportation Needs (08/20/2023)   PRAPARE - Administrator, Civil Service (Medical): No    Lack of Transportation (Non-Medical): No  Physical Activity: Insufficiently Active (08/20/2023)   Exercise Vital Sign    Days of Exercise per Week: 3 days    Minutes of Exercise per Session: 20 min  Stress: No Stress Concern Present (08/20/2023)   [employer] of Occupational Health - Occupational Stress Questionnaire    Feeling of Stress: Not at all  Social Connections: Moderately Integrated (08/20/2023)   Social Connection and Isolation Panel    Frequency of Communication with Friends and Family: Three times a week    Frequency of Social Gatherings with Friends and Family: Once a week    Attends Religious Services: More than 4 times per year    Active Member of [employer] or Organizations: No    Attends Banker Meetings: Never    Marital Status: Married     Family History: The patient's family history includes Hypertension in his mother.  ROS:   Please see the history of present illness.     All other systems reviewed and are negative.  EKGs/Labs/Other Studies Reviewed:    The following studies were reviewed today:       Recent Labs: 05/08/2023: Hemoglobin 14.6; Platelets 221.0; TSH 1.62 08/08/2023: ALT 16; BUN 13; Creatinine, Ser 1.23; Potassium 4.3; Sodium 135  Recent Lipid Panel    Component Value Date/Time   CHOL 97 08/08/2023 1057   TRIG 114.0 08/08/2023 1057   HDL 31.80 (L) 08/08/2023 1057   CHOLHDL 3 08/08/2023 1057   VLDL 22.8 08/08/2023 1057   LDLCALC 43 08/08/2023 1057   LDLDIRECT 51.0 08/08/2023 1057     Risk Assessment/Calculations:              Physical Exam:    VS:  BP 126/64 (BP Location: Left Arm, Patient Position: Sitting, Cuff Size: Normal)   Pulse 86   Ht 5' 10 (1.778 m)   Wt 213 lb 6.4 oz (96.8 kg)   SpO2 97%   BMI 30.62 kg/m     Wt Readings from Last 3 Encounters:  10/14/23 213 lb  6.4 oz (96.8 kg)  10/03/23 216 lb (98 kg)  08/20/23 219 lb (99.3 kg)     GEN:  Well nourished, well developed in no acute distress HEENT: Normal NECK: No JVD; No carotid bruits CARDIAC: RRR, 2/6 systolic murmur RESPIRATORY:  Clear to auscultation without rales, wheezing or rhonchi  ABDOMEN: Soft, non-tender, non-distended MUSCULOSKELETAL:  No edema; No deformity  SKIN: Warm and dry NEUROLOGIC:  Alert  and oriented x 3 PSYCHIATRIC:  Normal affect   ASSESSMENT:    1. Aortic valve stenosis, etiology of cardiac valve disease unspecified   2. Primary hypertension   3. Pure hypercholesterolemia    PLAN:    In order of problems listed above:  Moderate to severe aortic valve stenosis  on echocardiogram 08/2023.  Progressed from prior in 2017.  Patient clinically asymptomatic.  Repeat echocardiogram in 6 months. Hypertension, BP controlled.  Continue losartan  100 mg daily. Hyperlipidemia, cholesterol controlled.  Continue Lipitor 20 mg daily.  Follow-up in 6 months after repeat echocardiogram.     Medication Adjustments/Labs and Tests Ordered: Current medicines are reviewed at length with the patient today.  Concerns regarding medicines are outlined above.  Orders Placed This Encounter  Procedures   ECHOCARDIOGRAM COMPLETE   No orders of the defined types were placed in this encounter.   Patient Instructions  Medication Instructions:  No changes *If you need a refill on your cardiac medications before your next appointment, please call your pharmacy*  Lab Work: None ordered If you have labs (blood work) drawn today and your tests are completely normal, you will receive your results only by: MyChart Message (if you have MyChart) OR A paper copy in the mail If you have any lab test that is abnormal or we need to change your treatment, we will call you to review the results.  Testing/Procedures: Your physician has requested that you have an echocardiogram. Echocardiography is  a painless test that uses sound waves to create images of your heart. It provides your doctor with information about the size and shape of your heart and how well your heart's chambers and valves are working.   You may receive an ultrasound enhancing agent through an IV if needed to better visualize your heart during the echo. This procedure takes approximately one hour.  There are no restrictions for this procedure.  This will take place at [address] [facility] [address]) [address], [place] [address]  Please note: We ask at that you not bring children with you during ultrasound (echo/ vascular) testing. Due to room size and safety concerns, children are not allowed in the ultrasound rooms during exams. Our front office staff cannot provide observation of children in our lobby area while testing is being conducted. An adult accompanying a patient to their appointment will only be allowed in the ultrasound room at the discretion of the ultrasound technician under special circumstances. We apologize for any inconvenience.   Follow-Up: At [facility], you and your health needs are our priority.  As part of our continuing mission to provide you with exceptional heart care, our providers are all part of one team.  This team includes your primary Cardiologist (physician) and Advanced Practice Providers or APPs (Physician Assistants and Nurse Practitioners) who all work together to provide you with the care you need, when you need it.  Your next appointment:   6 month(s)  Provider:   You may see Dr. Darliss or one of the following Advanced Practice Providers on your designated Care Team:   Lonni Meager, NP Lesley Maffucci, PA-C Bernardino Bring, PA-C Cadence [place], PA-C Tylene Lunch, NP Barnie Hila, NP    We recommend signing up for the patient portal called MyChart.  Sign up information is provided on this After Visit Summary.  MyChart is used to connect with patients for  Virtual Visits (Telemedicine).  Patients are able to view lab/test results, encounter notes, upcoming appointments, etc.  Non-urgent messages can be sent to your provider as well.   To learn more about what you can do with MyChart, go to [email or web address].         Signed, Redell Cave, MD  10/14/2023 2:20 PM    [facility]

## 2023-10-14 NOTE — Patient Instructions (Signed)
 Medication Instructions:  No changes *If you need a refill on your cardiac medications before your next appointment, please call your pharmacy*  Lab Work: None ordered If you have labs (blood work) drawn today and your tests are completely normal, you will receive your results only by: MyChart Message (if you have MyChart) OR A paper copy in the mail If you have any lab test that is abnormal or we need to change your treatment, we will call you to review the results.  Testing/Procedures: Your physician has requested that you have an echocardiogram. Echocardiography is a painless test that uses sound waves to create images of your heart. It provides your doctor with information about the size and shape of your heart and how well your heart's chambers and valves are working.   You may receive an ultrasound enhancing agent through an IV if needed to better visualize your heart during the echo. This procedure takes approximately one hour.  There are no restrictions for this procedure.  This will take place at [address] [facility] [address]) [address], [place] [address]  Please note: We ask at that you not bring children with you during ultrasound (echo/ vascular) testing. Due to room size and safety concerns, children are not allowed in the ultrasound rooms during exams. Our front office staff cannot provide observation of children in our lobby area while testing is being conducted. An adult accompanying a patient to their appointment will only be allowed in the ultrasound room at the discretion of the ultrasound technician under special circumstances. We apologize for any inconvenience.   Follow-Up: At [facility], you and your health needs are our priority.  As part of our continuing mission to provide you with exceptional heart care, our providers are all part of one team.  This team includes your primary Cardiologist (physician) and Advanced Practice Providers or APPs  (Physician Assistants and Nurse Practitioners) who all work together to provide you with the care you need, when you need it.  Your next appointment:   6 month(s)  Provider:   You may see Dr. Darliss or one of the following Advanced Practice Providers on your designated Care Team:   Lonni Meager, NP Lesley Maffucci, PA-C Bernardino Bring, PA-C Cadence [place], PA-C Tylene Lunch, NP Barnie Hila, NP    We recommend signing up for the patient portal called MyChart.  Sign up information is provided on this After Visit Summary.  MyChart is used to connect with patients for Virtual Visits (Telemedicine).  Patients are able to view lab/test results, encounter notes, upcoming appointments, etc.  Non-urgent messages can be sent to your provider as well.   To learn more about what you can do with MyChart, go to [email or web address].

## 2023-10-15 ENCOUNTER — Encounter: Payer: Self-pay | Admitting: Internal Medicine

## 2023-10-16 ENCOUNTER — Telehealth: Payer: Self-pay

## 2023-10-16 ENCOUNTER — Encounter: Payer: Self-pay | Admitting: Internal Medicine

## 2023-10-16 NOTE — Telephone Encounter (Signed)
 Left message to return call to our office.  Please schedule an acute visit with any provider to discuss issue.

## 2023-10-17 ENCOUNTER — Ambulatory Visit: Admitting: Family Medicine

## 2023-10-18 ENCOUNTER — Ambulatory Visit (INDEPENDENT_AMBULATORY_CARE_PROVIDER_SITE_OTHER): Admitting: Family Medicine

## 2023-10-18 ENCOUNTER — Ambulatory Visit: Payer: Self-pay | Admitting: Family Medicine

## 2023-10-18 ENCOUNTER — Ambulatory Visit: Admitting: Family Medicine

## 2023-10-18 ENCOUNTER — Encounter: Payer: Self-pay | Admitting: Family Medicine

## 2023-10-18 VITALS — BP 148/94 | HR 88 | Temp 97.8°F | Ht 70.0 in | Wt 209.1 lb

## 2023-10-18 DIAGNOSIS — I10 Essential (primary) hypertension: Secondary | ICD-10-CM

## 2023-10-18 DIAGNOSIS — M79671 Pain in right foot: Secondary | ICD-10-CM

## 2023-10-18 DIAGNOSIS — R600 Localized edema: Secondary | ICD-10-CM

## 2023-10-18 DIAGNOSIS — M79672 Pain in left foot: Secondary | ICD-10-CM | POA: Diagnosis not present

## 2023-10-18 DIAGNOSIS — E669 Obesity, unspecified: Secondary | ICD-10-CM

## 2023-10-18 LAB — CBC WITH DIFFERENTIAL/PLATELET
Basophils Absolute: 0.1 K/uL (ref 0.0–0.1)
Basophils Relative: 0.8 % (ref 0.0–3.0)
Eosinophils Absolute: 0.3 K/uL (ref 0.0–0.7)
Eosinophils Relative: 3.3 % (ref 0.0–5.0)
HCT: 39.6 % (ref 39.0–52.0)
Hemoglobin: 13.7 g/dL (ref 13.0–17.0)
Lymphocytes Relative: 12.3 % (ref 12.0–46.0)
Lymphs Abs: 1 K/uL (ref 0.7–4.0)
MCHC: 34.6 g/dL (ref 30.0–36.0)
MCV: 85.9 fl (ref 78.0–100.0)
Monocytes Absolute: 0.7 K/uL (ref 0.1–1.0)
Monocytes Relative: 8.1 % (ref 3.0–12.0)
Neutro Abs: 6.3 K/uL (ref 1.4–7.7)
Neutrophils Relative %: 75.5 % (ref 43.0–77.0)
Platelets: 309 K/uL (ref 150.0–400.0)
RBC: 4.62 Mil/uL (ref 4.22–5.81)
RDW: 13.7 % (ref 11.5–15.5)
WBC: 8.4 K/uL (ref 4.0–10.5)

## 2023-10-18 LAB — BASIC METABOLIC PANEL WITH GFR
BUN: 17 mg/dL (ref 6–23)
CO2: 29 meq/L (ref 19–32)
Calcium: 9.2 mg/dL (ref 8.4–10.5)
Chloride: 99 meq/L (ref 96–112)
Creatinine, Ser: 1.29 mg/dL (ref 0.40–1.50)
GFR: 53.02 mL/min — ABNORMAL LOW (ref >60.00)
Glucose, Bld: 152 mg/dL — ABNORMAL HIGH (ref 70–99)
Potassium: 4.7 meq/L (ref 3.5–5.1)
Sodium: 137 meq/L (ref 135–145)

## 2023-10-18 LAB — HEPATIC FUNCTION PANEL
ALT: 12 U/L (ref 0–53)
AST: 13 U/L (ref 0–37)
Albumin: 4.3 g/dL (ref 3.5–5.2)
Alkaline Phosphatase: 74 U/L (ref 39–117)
Bilirubin, Direct: 0.3 mg/dL (ref 0.0–0.3)
Total Bilirubin: 1.3 mg/dL — ABNORMAL HIGH (ref 0.2–1.2)
Total Protein: 6.8 g/dL (ref 6.0–8.3)

## 2023-10-18 LAB — URIC ACID: Uric Acid, Serum: 4 mg/dL (ref 4.0–7.8)

## 2023-10-18 MED ORDER — CHLORTHALIDONE 25 MG PO TABS: 12.5000 mg | ORAL_TABLET | Freq: Every day | ORAL | 0 refills | Status: DC

## 2023-10-18 NOTE — Assessment & Plan Note (Signed)
 Blood pressure is elevated today  Also significant pedal edema  BP: (!) 148/94  Losartan  100 mg daily  Reviewed pcp notes and history    Will check lab Consider diuretic  Encouraged low na diet/DASH handout given

## 2023-10-18 NOTE — Patient Instructions (Signed)
 Elevate your feet whenever you can  Avoid excessive sodium intake   Wear the compression socks during the day   Stay cool   Labs today  We will make a plan when those come back

## 2023-10-18 NOTE — Assessment & Plan Note (Signed)
 Pain is dorsal and posterior  In setting of significant swelling May be due to edema Exam is otherwise reassuring   Lab today  Consider diuretic

## 2023-10-18 NOTE — Telephone Encounter (Signed)
 Spoke with pt and he stated that he has been scheduled to see someone at [facility].

## 2023-10-18 NOTE — Progress Notes (Signed)
 Subjective:    Patient ID: Chad Gill, male    DOB: [birth year], 78 y.o.   MRN: [id number]  HPI  Wt Readings from Last 3 Encounters:  10/18/23 209 lb 2 oz (94.9 kg)  10/14/23 213 lb 6.4 oz (96.8 kg)  10/03/23 216 lb (98 kg)   30.01 kg/m  Vitals:   10/18/23 1236  BP: (!) 148/94  Pulse: 88  Temp: 97.8 F (36.6 C)  SpO2: 97%    78 yo pt of Dr Marylynn presents with c/o  Foot pain  Foot swelling   PMH is significant for    HTN-on losartan   CAD Obesity Cirrhosis from NASH A1c in the diabetic range in June -taking metformin  and semaglutide   AS with echo planned 8/25   Started about 3 weeks ago  Noticed it when sitting down  Pain on top of foot and back of ankle  Constant throbbing pain  A little tight feeling   Worse when walking but still hurts when sitting  Wakes him up at night   Has not taken pain medicine  Tried ice - did not help at all  Same with or without shoes   Swelling- same time  No new activity No trauma   Has never had ankle swelling   Aortic stenosis Last echo 08/2023 -normal syst fxn, od to severe aortic valve stenosis  Pt declines pnd/orthopnea or any shortness of breath whatsoever    Lab Results  Component Value Date   HGBA1C 6.6 (H) 08/08/2023   Liver Lab Results  Component Value Date   ALT 16 08/08/2023   AST 13 08/08/2023   ALKPHOS 44 08/08/2023   BILITOT 1.4 (H) 08/08/2023     Patient Active Problem List   Diagnosis Date Noted   Pedal edema 10/18/2023   Foot pain, bilateral 10/18/2023   Coronary artery disease due to calcified coronary lesion 09/01/2023   Cerebrovascular disease, unspecified 11/06/2022   Erectile dysfunction 11/29/2021   Aortic valve stenosis 04/02/2021   Advice given about COVID-19 virus by telephone 03/09/2019   Persistent cough for 3 weeks or longer 01/16/2016   Hyperlipidemia associated with type 2 diabetes mellitus (HCC) 04/16/2015   Liver cirrhosis secondary to NASH (HCC) 01/17/2015    Medicare annual wellness visit, subsequent 10/12/2014   Uses hearing aid 09/10/2013   Tinnitus of both ears 06/03/2013   Obesity, diabetes, and hypertension syndrome (HCC) 08/26/2012   Hypertension 01/30/2012   Obesity (BMI 30-39.9) 07/24/2011   GERD (gastroesophageal reflux disease)    Adenomatous polyps    Past Medical History:  Diagnosis Date   Barrett's esophagus 2004   last EGD 2007, no metaplasia, due in 2009   Diabetes mellitus    Fatty liver disease, nonalcoholic    GERD (gastroesophageal reflux disease)    Hyperplastic colonic polyp 2004   Lumbar disc herniation 2007   L5   Transaminitis    Past Surgical History:  Procedure Laterality Date   COLONOSCOPY WITH PROPOFOL  N/A 12/11/2019   Procedure: COLONOSCOPY WITH PROPOFOL ;  Surgeon: Dessa Reyes ORN, MD;  Location: [facility] ENDOSCOPY;  Service: Endoscopy;  Laterality: N/A;   SMALL INTESTINE SURGERY     carcinoid tumore, diagnosis disputed   TONSILLECTOMY  age 31   Social History   Tobacco Use   Smoking status: Former    Current packs/day: 0.00    Types: Cigarettes    Quit date: 01/21/1986    Years since quitting: 37.7   Smokeless tobacco: Never  Vaping Use  Vaping status: Never Used  Substance Use Topics   Alcohol use: Yes    Comment: ocassional beer   Drug use: No   Family History  Problem Relation Age of Onset   Hypertension Mother    Allergies  Allergen Reactions   Other Hives    Flu vaccine   Influenza Vac Split [Influenza Virus Vaccine] Hives   Current Outpatient Medications on File Prior to Visit  Medication Sig Dispense Refill   Ascorbic Acid (VITAMIN C PO) Take 500 mg by mouth.     aspirin  EC 81 MG tablet Take 1 tablet (81 mg total) by mouth daily. Swallow whole.     atorvastatin  (LIPITOR) 20 MG tablet TAKE 1 TABLET BY MOUTH EVERY DAY 90 tablet 3   fluticasone  (FLONASE ) 50 MCG/ACT nasal spray SPRAY 2 SPRAYS INTO EACH NOSTRIL EVERY DAY 48 mL 2   glucose blood test strip Use to check blood  sugars once daily 100 each 12   Lancets (ONETOUCH ULTRASOFT) lancets Use as instructed 100 each 12   levalbuterol  (XOPENEX  HFA) 45 MCG/ACT inhaler Inhale 2 puffs into the lungs every 6 (six) hours as needed for wheezing or shortness of breath (cough). 15 g 2   losartan  (COZAAR ) 100 MG tablet TAKE 1 TABLET BY MOUTH EVERY DAY 60 tablet 0   metFORMIN  (GLUCOPHAGE ) 500 MG tablet Take 1 tablet (500 mg total) by mouth 2 (two) times daily with a meal. 180 tablet 3   omeprazole  (PRILOSEC) 20 MG capsule TAKE 1 CAPSULE BY MOUTH TWICE A DAY 120 capsule 0   Semaglutide , 1 MG/DOSE, 4 MG/3ML SOPN Inject 1 mg as directed once a week. 3 mL 2   sildenafil  (REVATIO ) 20 MG tablet TAKE 1 TABLET BY MOUTH THREE TIMES A DAY 270 tablet 1   Turmeric 400 MG CAPS Take 1 capsule by mouth daily.     No current facility-administered medications on file prior to visit.    Review of Systems  Constitutional:  Negative for activity change, appetite change, fatigue, fever and unexpected weight change.  HENT:  Negative for congestion, rhinorrhea, sore throat and trouble swallowing.   Eyes:  Negative for pain, redness, itching and visual disturbance.  Respiratory:  Negative for cough, chest tightness, shortness of breath and wheezing.   Cardiovascular:  Positive for leg swelling. Negative for chest pain and palpitations.  Gastrointestinal:  Negative for abdominal pain, blood in stool, constipation, diarrhea and nausea.  Endocrine: Negative for cold intolerance, heat intolerance, polydipsia and polyuria.  Genitourinary:  Negative for difficulty urinating, dysuria, frequency and urgency.  Musculoskeletal:  Negative for arthralgias, joint swelling and myalgias.       Bilateral foot pain-posterior/dorsal with swelling   Skin:  Negative for pallor and rash.  Neurological:  Negative for dizziness, tremors, weakness, numbness and headaches.  Hematological:  Negative for adenopathy. Does not bruise/bleed easily.   Psychiatric/Behavioral:  Negative for decreased concentration and dysphoric mood. The patient is not nervous/anxious.        Objective:   Physical Exam Constitutional:      General: He is not in acute distress.    Appearance: Normal appearance. He is well-developed. He is obese. He is not ill-appearing or diaphoretic.  HENT:     Head: Normocephalic and atraumatic.  Eyes:     Conjunctiva/sclera: Conjunctivae normal.     Pupils: Pupils are equal, round, and reactive to light.  Neck:     Thyroid : No thyromegaly.     Vascular: No carotid bruit or JVD.  Cardiovascular:     Rate and Rhythm: Normal rate and regular rhythm.     Heart sounds: Murmur heard.     No gallop.     Comments: Pedal pulses are difficult to locate based on edema   Feet are warm symmetrically  Pulmonary:     Effort: Pulmonary effort is normal. No respiratory distress.     Breath sounds: Normal breath sounds. No wheezing or rales.  Abdominal:     General: There is no distension or abdominal bruit.     Palpations: Abdomen is soft. There is no shifting dullness, fluid wave or mass.     Tenderness: There is no abdominal tenderness. There is no guarding or rebound.  Musculoskeletal:     Cervical back: Normal range of motion and neck supple.     Right lower leg: Edema present.     Left lower leg: Edema present.     Comments: 1-2 plus pitting edema in ankles and dorsal feet Tender to the touch No erythema or warmth   Normal rom ankle and feet Some tenderness over achilles bilat   Lymphadenopathy:     Cervical: No cervical adenopathy.  Skin:    General: Skin is warm and dry.     Coloration: Skin is not pale.     Findings: No rash.  Neurological:     Mental Status: He is alert.     Coordination: Coordination normal.     Deep Tendon Reflexes: Reflexes are normal and symmetric. Reflexes normal.  Psychiatric:        Mood and Affect: Mood normal.           Assessment & Plan:   Problem List Items  Addressed This Visit       Cardiovascular and Mediastinum   Hypertension   Blood pressure is elevated today  Also significant pedal edema  BP: (!) 148/94  Losartan  100 mg daily  Reviewed pcp notes and history    Will check lab Consider diuretic  Encouraged low na diet/DASH handout given        Other   Pedal edema - Primary   Significant-in pt with history of HDN/ DM and cirrhosis  New in past 3 weeks No cardiac symptoms but known AS and last echo reviewed  No neuro changes   Lab today for renal/hepatic function , cbc and uric acid  Encouraged use of compression socks during day (has tried)  Elevation Keep cool  Avoid excess sodium-handout   Pending labs-consider diuretic        Relevant Orders   Basic metabolic panel with GFR   Hepatic function panel   CBC with Differential/Platelet   Uric acid   Foot pain, bilateral   Pain is dorsal and posterior  In setting of significant swelling May be due to edema Exam is otherwise reassuring   Lab today  Consider diuretic       Relevant Orders   Basic metabolic panel with GFR   Hepatic function panel   CBC with Differential/Platelet   Uric acid

## 2023-10-18 NOTE — Assessment & Plan Note (Addendum)
 Significant-in pt with history of HDN/ DM and cirrhosis  New in past 3 weeks No cardiac symptoms but known AS and last echo reviewed  No neuro changes   Lab today for renal/hepatic function , cbc and uric acid  Encouraged use of compression socks during day (has tried)  Elevation Keep cool  Avoid excess sodium-handout   Pending labs-consider diuretic

## 2023-10-31 ENCOUNTER — Other Ambulatory Visit: Payer: Self-pay | Admitting: Internal Medicine

## 2023-11-12 ENCOUNTER — Encounter: Payer: Self-pay | Admitting: Internal Medicine

## 2023-11-12 ENCOUNTER — Ambulatory Visit (INDEPENDENT_AMBULATORY_CARE_PROVIDER_SITE_OTHER): Admitting: Internal Medicine

## 2023-11-12 VITALS — BP 100/60 | HR 80 | Temp 98.2°F | Ht 70.0 in | Wt 202.4 lb

## 2023-11-12 DIAGNOSIS — K7581 Nonalcoholic steatohepatitis (NASH): Secondary | ICD-10-CM

## 2023-11-12 DIAGNOSIS — E669 Obesity, unspecified: Secondary | ICD-10-CM | POA: Diagnosis not present

## 2023-11-12 DIAGNOSIS — Z7985 Long-term (current) use of injectable non-insulin antidiabetic drugs: Secondary | ICD-10-CM

## 2023-11-12 DIAGNOSIS — E1159 Type 2 diabetes mellitus with other circulatory complications: Secondary | ICD-10-CM | POA: Diagnosis not present

## 2023-11-12 DIAGNOSIS — Z1211 Encounter for screening for malignant neoplasm of colon: Secondary | ICD-10-CM

## 2023-11-12 DIAGNOSIS — I152 Hypertension secondary to endocrine disorders: Secondary | ICD-10-CM

## 2023-11-12 DIAGNOSIS — E1169 Type 2 diabetes mellitus with other specified complication: Secondary | ICD-10-CM | POA: Diagnosis not present

## 2023-11-12 DIAGNOSIS — E119 Type 2 diabetes mellitus without complications: Secondary | ICD-10-CM

## 2023-11-12 DIAGNOSIS — I10 Essential (primary) hypertension: Secondary | ICD-10-CM

## 2023-11-12 DIAGNOSIS — K7460 Unspecified cirrhosis of liver: Secondary | ICD-10-CM

## 2023-11-12 MED ORDER — LOSARTAN POTASSIUM 100 MG PO TABS: 100.0000 mg | ORAL_TABLET | Freq: Every day | ORAL | 1 refills | Status: AC

## 2023-11-12 MED ORDER — METFORMIN HCL 500 MG PO TABS: 500.0000 mg | ORAL_TABLET | Freq: Two times a day (BID) | ORAL | 3 refills | Status: AC

## 2023-11-12 MED ORDER — OMEPRAZOLE 20 MG PO CPDR: 20.0000 mg | DELAYED_RELEASE_CAPSULE | Freq: Two times a day (BID) | ORAL | 1 refills | Status: DC

## 2023-11-12 NOTE — Progress Notes (Unsigned)
 Subjective:  Patient ID: Chad Gill, male    DOB: 07/10/1945  Age: 78 y.o. MRN: [id number]  CC: The primary encounter diagnosis was Obesity, diabetes, and hypertension syndrome (HCC). Diagnoses of Essential hypertension, Liver cirrhosis secondary to NASH [facility]), Colon cancer screening, and Diabetes mellitus treated with injections of non-insulin medication (HCC) were also pertinent to this visit.   HPI Chad Gill presents for  Chief Complaint  Patient presents with   Medical Management of Chronic Issues    6 month follow up     1) OBEITY DIABETES HYPERTENSION:   He feels generally well, is exercising several times per week and checking blood sugars once daily at variable times.  BS have been under 130 fasting and < 150 post prandially.  Denies any recent hypoglyemic events.  Taking his medications as directed. Following a carbohydrate modified diet 6 days per week. Denies numbness, burning and tingling of extremities. Appetite is good.       2) CKD STAGE 3 ;  he is followed by nephrology and has had an imprvoed GFR  > 60 ML/MIN FOR THE PAST YEAR  3)  Cirrhosis, non alcoholic  :  reviewed the need for semi annula surveillance for hepatocellular CA    Outpatient Medications Prior to Visit  Medication Sig Dispense Refill   Ascorbic Acid (VITAMIN C PO) Take 500 mg by mouth.     aspirin  EC 81 MG tablet Take 1 tablet (81 mg total) by mouth daily. Swallow whole.     atorvastatin  (LIPITOR) 20 MG tablet TAKE 1 TABLET BY MOUTH EVERY DAY 90 tablet 3   chlorthalidone  (HYGROTON ) 25 MG tablet Take 0.5 tablets (12.5 mg total) by mouth daily. In am 30 tablet 0   fluticasone  (FLONASE ) 50 MCG/ACT nasal spray SPRAY 2 SPRAYS INTO EACH NOSTRIL EVERY DAY 48 mL 2   glucose blood test strip Use to check blood sugars once daily 100 each 12   Lancets (ONETOUCH ULTRASOFT) lancets Use as instructed 100 each 12   levalbuterol  (XOPENEX  HFA) 45 MCG/ACT inhaler Inhale 2 puffs into the lungs every 6  (six) hours as needed for wheezing or shortness of breath (cough). 15 g 2   sildenafil  (REVATIO ) 20 MG tablet TAKE 1 TABLET BY MOUTH THREE TIMES A DAY 270 tablet 1   Turmeric 400 MG CAPS Take 1 capsule by mouth daily.     losartan  (COZAAR ) 100 MG tablet TAKE 1 TABLET BY MOUTH EVERY DAY 60 tablet 0   metFORMIN  (GLUCOPHAGE ) 500 MG tablet Take 1 tablet (500 mg total) by mouth 2 (two) times daily with a meal. 180 tablet 3   omeprazole  (PRILOSEC) 20 MG capsule TAKE 1 CAPSULE BY MOUTH TWICE A DAY 120 capsule 0   Semaglutide , 1 MG/DOSE, (OZEMPIC , 1 MG/DOSE,) 4 MG/3ML SOPN INJECT 1 MG SUBCUTANEOUSLY ONCE A WEEK AS DIRECTED (Patient not taking: Reported on 11/12/2023) 3 mL 2   No facility-administered medications prior to visit.    Review of Systems;  Patient denies headache, fevers, malaise, unintentional weight loss, skin rash, eye pain, sinus congestion and sinus pain, sore throat, dysphagia,  hemoptysis , cough, dyspnea, wheezing, chest pain, palpitations, orthopnea, edema, abdominal pain, nausea, melena, diarrhea, constipation, flank pain, dysuria, hematuria, urinary  Frequency, nocturia, numbness, tingling, seizures,  Focal weakness, Loss of consciousness,  Tremor, insomnia, depression, anxiety, and suicidal ideation.      Objective:  BP 100/60   Pulse 80   Temp 98.2 F (36.8 C) (Oral)   Ht 5'  10 (1.778 m)   Wt 202 lb 6.4 oz (91.8 kg)   SpO2 97%   BMI 29.04 kg/m   BP Readings from Last 3 Encounters:  11/12/23 100/60  10/18/23 (!) 148/94  10/14/23 126/64    Wt Readings from Last 3 Encounters:  11/12/23 202 lb 6.4 oz (91.8 kg)  10/18/23 209 lb 2 oz (94.9 kg)  10/14/23 213 lb 6.4 oz (96.8 kg)    Physical Exam  Lab Results  Component Value Date   HGBA1C 6.6 (H) 08/08/2023   HGBA1C 7.1 (H) 05/08/2023   HGBA1C 7.4 (H) 11/06/2022    Lab Results  Component Value Date   CREATININE 1.29 10/18/2023   CREATININE 1.23 08/08/2023   CREATININE 1.21 05/08/2023    Lab Results   Component Value Date   WBC 8.4 10/18/2023   HGB 13.7 10/18/2023   HCT 39.6 10/18/2023   PLT 309.0 10/18/2023   GLUCOSE 152 (H) 10/18/2023   CHOL 97 08/08/2023   TRIG 114.0 08/08/2023   HDL 31.80 (L) 08/08/2023   LDLDIRECT 51.0 08/08/2023   LDLCALC 43 08/08/2023   ALT 12 10/18/2023   AST 13 10/18/2023   NA 137 10/18/2023   K 4.7 10/18/2023   CL 99 10/18/2023   CREATININE 1.29 10/18/2023   BUN 17 10/18/2023   CO2 29 10/18/2023   TSH 1.62 05/08/2023   PSA 1.35 03/13/2019   HGBA1C 6.6 (H) 08/08/2023   MICROALBUR <0.7 05/08/2023    No results found.  Assessment & Plan:  .Obesity, diabetes, and hypertension syndrome (HCC) Assessment & Plan: Now taking ozempic .  Still taking   metformin  and Januvia .  Januvia  discontinued.  He has lost 21 6 lbs since the start of medicatioN AND IS CURRENTLY TAKING 1 MG WEEK. l.  Taking baby asa.     Continue  losartan  to 100 mg daily,  continue statin.     LDL is at goal on atorvasatin 20 mg I have reviewed patient's  prior screening test for nephropathy, and the corrected UaCr is   12.  No medication changes are needed   Lab Results  Component Value Date   MICROALBUR <0.7 05/08/2023   Lab Results  Component Value Date   CHOL 97 08/08/2023   HDL 31.80 (L) 08/08/2023   LDLCALC 43 08/08/2023   LDLDIRECT 51.0 08/08/2023   TRIG 114.0 08/08/2023   CHOLHDL 3 08/08/2023        Essential hypertension -     Losartan  Potassium; Take 1 tablet (100 mg total) by mouth daily.  Dispense: 90 tablet; Refill: 1  Liver cirrhosis secondary to NASH [facility]) Assessment & Plan: With cirrhosis by prior evaluation at [facility].  SABRA SEMI ANNUAL screening for hepatocellular CA with AFP tumor marker  is recommended   Lab Results  Component Value Date   ALT 12 10/18/2023   AST 13 10/18/2023   ALKPHOS 74 10/18/2023   BILITOT 1.3 (H) 10/18/2023     Orders: -     US  ABDOMEN LIMITED RUQ (LIVER/GB); Future -     AFP tumor marker  Colon cancer screening -      Ambulatory referral to Gastroenterology  Diabetes mellitus treated with injections of non-insulin medication (HCC) Assessment & Plan: Continue ozempic  , currently at 1 mg weekly dose    Other orders -     metFORMIN  HCl; Take 1 tablet (500 mg total) by mouth 2 (two) times daily with a meal.  Dispense: 180 tablet; Refill: 3 -     Omeprazole ;  Take 1 capsule (20 mg total) by mouth 2 (two) times daily.  Dispense: 120 capsule; Refill: 1     Follow-up: Return in about 6 months (around 05/11/2024).   Verneita LITTIE Kettering, MD

## 2023-11-12 NOTE — Assessment & Plan Note (Signed)
 With cirrhosis by prior evaluation at [facility].  SABRA SEMI ANNUAL screening for hepatocellular CA with AFP tumor marker  is recommended   Lab Results  Component Value Date   ALT 12 10/18/2023   AST 13 10/18/2023   ALKPHOS 74 10/18/2023   BILITOT 1.3 (H) 10/18/2023

## 2023-11-12 NOTE — Assessment & Plan Note (Signed)
 Now taking ozempic .  Still taking   metformin  and Januvia .  Januvia  discontinued.  He has lost 21 6 lbs since the start of medicatioN AND IS CURRENTLY TAKING 1 MG WEEK. l.  Taking baby asa.     Continue  losartan  to 100 mg daily,  continue statin.     LDL is at goal on atorvasatin 20 mg I have reviewed patient's  prior screening test for nephropathy, and the corrected UaCr is   12.  No medication changes are needed   Lab Results  Component Value Date   MICROALBUR <0.7 05/08/2023   Lab Results  Component Value Date   CHOL 97 08/08/2023   HDL 31.80 (L) 08/08/2023   LDLCALC 43 08/08/2023   LDLDIRECT 51.0 08/08/2023   TRIG 114.0 08/08/2023   CHOLHDL 3 08/08/2023

## 2023-11-12 NOTE — Patient Instructions (Addendum)
 Ways to manage constipation :  Magnesium supplement or milk of magnesium  (#1 choice of patients) COLACE (STOOL SOFTENER) Bulk forming laxative like  Citrucel, Benefiber,  metamucil, or Fibercon   Ultrasound of liver is due and has been ordered.   Call me if there has been no contact by next week   You are overdue for colonoscopy (2021)  so a referral  has been made. Call y=if you aere contacted  in 2 weeks

## 2023-11-13 LAB — AFP TUMOR MARKER: AFP-Tumor Marker: 1.8 ng/mL (ref <6.1)

## 2023-11-14 ENCOUNTER — Ambulatory Visit: Payer: Self-pay | Admitting: Internal Medicine

## 2023-11-14 DIAGNOSIS — E119 Type 2 diabetes mellitus without complications: Secondary | ICD-10-CM | POA: Insufficient documentation

## 2023-11-14 NOTE — Assessment & Plan Note (Signed)
 Continue ozempic  , currently at 1 mg weekly dose

## 2023-11-18 ENCOUNTER — Ambulatory Visit
Admission: RE | Admit: 2023-11-18 | Discharge: 2023-11-18 | Disposition: A | Discharge status: Home / Self Care | Source: Ambulatory Visit | Attending: Internal Medicine | Admitting: Internal Medicine

## 2023-11-18 DIAGNOSIS — K7460 Unspecified cirrhosis of liver: Secondary | ICD-10-CM | POA: Diagnosis present

## 2023-11-18 DIAGNOSIS — K7581 Nonalcoholic steatohepatitis (NASH): Secondary | ICD-10-CM | POA: Insufficient documentation

## 2023-11-26 ENCOUNTER — Ambulatory Visit: Attending: Cardiology

## 2023-11-26 DIAGNOSIS — I350 Nonrheumatic aortic (valve) stenosis: Secondary | ICD-10-CM | POA: Diagnosis present

## 2023-11-26 LAB — ECHOCARDIOGRAM COMPLETE
AR max vel: 0.93 cm2
AV Area VTI: 1.02 cm2
AV Area mean vel: 0.92 cm2
AV Mean grad: 38 mmHg
AV Peak grad: 66.6 mmHg
Ao pk vel: 4.08 m/s
Area-P 1/2: 3.27 cm2
MV VTI: 2.08 cm2
S' Lateral: 2.4 cm

## 2023-11-28 ENCOUNTER — Ambulatory Visit: Payer: Self-pay | Admitting: Cardiology

## 2023-12-10 ENCOUNTER — Other Ambulatory Visit: Payer: Self-pay | Admitting: Family Medicine

## 2023-12-26 ENCOUNTER — Encounter: Payer: Self-pay | Admitting: Internal Medicine

## 2023-12-26 DIAGNOSIS — E119 Type 2 diabetes mellitus without complications: Secondary | ICD-10-CM

## 2023-12-30 ENCOUNTER — Other Ambulatory Visit: Payer: Self-pay | Admitting: Internal Medicine

## 2023-12-30 DIAGNOSIS — E119 Type 2 diabetes mellitus without complications: Secondary | ICD-10-CM

## 2023-12-30 NOTE — Assessment & Plan Note (Signed)
 No longer taking ozempic  as of Nov phone conversation due to concerns about side effect of blindness.

## 2024-01-03 ENCOUNTER — Encounter: Payer: Self-pay | Admitting: Pulmonary Disease

## 2024-01-03 ENCOUNTER — Ambulatory Visit (INDEPENDENT_AMBULATORY_CARE_PROVIDER_SITE_OTHER)

## 2024-01-03 ENCOUNTER — Ambulatory Visit (INDEPENDENT_AMBULATORY_CARE_PROVIDER_SITE_OTHER): Admitting: Pulmonary Disease

## 2024-01-03 VITALS — BP 126/60 | HR 88 | Temp 97.7°F | Ht 70.0 in | Wt 206.6 lb

## 2024-01-03 DIAGNOSIS — J449 Chronic obstructive pulmonary disease, unspecified: Secondary | ICD-10-CM | POA: Diagnosis not present

## 2024-01-03 DIAGNOSIS — I350 Nonrheumatic aortic (valve) stenosis: Secondary | ICD-10-CM

## 2024-01-03 DIAGNOSIS — R053 Chronic cough: Secondary | ICD-10-CM

## 2024-01-03 DIAGNOSIS — J920 Pleural plaque with presence of asbestos: Secondary | ICD-10-CM

## 2024-01-03 DIAGNOSIS — R911 Solitary pulmonary nodule: Secondary | ICD-10-CM

## 2024-01-03 DIAGNOSIS — R0602 Shortness of breath: Secondary | ICD-10-CM

## 2024-01-03 LAB — PULMONARY FUNCTION TEST
DL/VA % pred: 94 %
DL/VA: 3.66 ml/min/mmHg/L
DLCO unc % pred: 87 %
DLCO unc: 21.28 ml/min/mmHg
FEF 25-75 Post: 1.67 L/s
FEF 25-75 Pre: 1.26 L/s
FEF2575-%Change-Post: 31 %
FEF2575-%Pred-Post: 80 %
FEF2575-%Pred-Pre: 60 %
FEV1-%Change-Post: 1 %
FEV1-%Pred-Post: 72 %
FEV1-%Pred-Pre: 72 %
FEV1-Post: 2.15 L
FEV1-Pre: 2.12 L
FEV1FVC-%Change-Post: -8 %
FEV1FVC-%Pred-Pre: 91 %
FEV6-%Change-Post: 9 %
FEV6-%Pred-Post: 91 %
FEV6-%Pred-Pre: 83 %
FEV6-Post: 3.51 L
FEV6-Pre: 3.2 L
FEV6FVC-%Change-Post: 0 %
FEV6FVC-%Pred-Post: 105 %
FEV6FVC-%Pred-Pre: 105 %
FVC-%Change-Post: 11 %
FVC-%Pred-Post: 87 %
FVC-%Pred-Pre: 78 %
FVC-Post: 3.6 L
FVC-Pre: 3.23 L
Post FEV1/FVC ratio: 60 %
Post FEV6/FVC ratio: 99 %
Pre FEV1/FVC ratio: 66 %
Pre FEV6/FVC Ratio: 99 %

## 2024-01-03 MED ORDER — TRELEGY ELLIPTA 100-62.5-25 MCG/ACT IN AEPB: 1.0000 | INHALATION_SPRAY | Freq: Every day | RESPIRATORY_TRACT | 3 refills | Status: DC

## 2024-01-03 NOTE — Patient Instructions (Signed)
 PFT completed today without pleth.

## 2024-01-03 NOTE — Progress Notes (Signed)
 PFT completed today without pleth due to pt unable to hear well without hearing aids.

## 2024-01-03 NOTE — Progress Notes (Signed)
 Subjective:    Patient ID: Chad Gill, male    DOB: 05/15/1945, 78 y.o.   MRN: [id number]  Patient Care Team: Chad Verneita CROME, MD as PCP - General (Internal Medicine) Pa, [facility])  Chief Complaint  Patient presents with   Cough    Cough, mainly at night.     BACKGROUND/INTERVAL:Patient is a 78 year old remote former smoker with a history as noted below, who presents for fall of a persistent cough and abnormal chest CT findings.   HPI Discussed the use of AI scribe software for clinical note transcription with the patient, who gave verbal consent to proceed.  History of Present Illness   Chad Gill is a 78 year old male with COPD and aortic stenosis who presents with a cough.  He presents with his wife Chad Gill.  He experiences a cough that is more pronounced at night when lying down, although it is present during the day as well to a lesser degree. No reflux or heartburn.  He had PFTs today which confirmed COPD and experiences shortness of breath. He uses an inhaler (Xopenex ) as needed for shortness of breath.  He has a history of asbestos exposure and a ground glass lung nodule.  He has follow-up imaging ordered for January to follow-up on this issue.  He also has aortic stenosis which is moderate to severe.  He has echocardiograms every 6 months to follow-up on this issue.  He follows with cardiology.  He had PFTs today however these were limited to spirometry pre and post and DLCO as the patient was not wearing his hearing aids and could not hear when placed in the body box for lung volumes.  PFT shows FEV1 of 2.12 L or 72% predicted, FVC of 3.23 L or 78% predicted, FEV1/FVC of 66%, there was no significant bronchodilator response.  Diffusion capacity was normal.  Consistent with moderate obstructive defect.   Review of Systems A 10 point review of systems was performed and it is as noted above otherwise negative.   Patient Active Problem  List   Diagnosis Date Noted   Diabetes mellitus treated with injections of non-insulin medication (HCC) 11/14/2023   Pedal edema 10/18/2023   Foot pain, bilateral 10/18/2023   Coronary artery disease due to calcified coronary lesion 09/01/2023   Cerebrovascular disease, unspecified 11/06/2022   Erectile dysfunction 11/29/2021   Aortic valve stenosis 04/02/2021   Advice given about COVID-19 virus by telephone 03/09/2019   Persistent cough for 3 weeks or longer 01/16/2016   Hyperlipidemia associated with type 2 diabetes mellitus (HCC) 04/16/2015   Liver cirrhosis secondary to NASH (HCC) 01/17/2015   Medicare annual wellness visit, subsequent 10/12/2014   Uses hearing aid 09/10/2013   Tinnitus of both ears 06/03/2013   Obesity, diabetes, and hypertension syndrome (HCC) 08/26/2012   Hypertension 01/30/2012   GERD (gastroesophageal reflux disease)    Adenomatous polyps     Social History   Tobacco Use   Smoking status: Former    Current packs/day: 0.00    Types: Cigarettes    Quit date: 01/21/1986    Years since quitting: 37.9   Smokeless tobacco: Never  Substance Use Topics   Alcohol use: Yes    Comment: ocassional beer    Allergies  Allergen Reactions   Other Hives    Flu vaccine   Influenza Vac Split [Influenza Virus Vaccine] Hives    Current Meds  Medication Sig   Ascorbic Acid (VITAMIN C PO) Take 500  mg by mouth.   aspirin  EC 81 MG tablet Take 1 tablet (81 mg total) by mouth daily. Swallow whole.   atorvastatin  (LIPITOR) 20 MG tablet TAKE 1 TABLET BY MOUTH EVERY DAY   chlorthalidone  (HYGROTON ) 25 MG tablet TAKE 0.5 TABLETS (12.5 MG TOTAL) BY MOUTH DAILY. IN AM   fluticasone  (FLONASE ) 50 MCG/ACT nasal spray SPRAY 2 SPRAYS INTO EACH NOSTRIL EVERY DAY   Fluticasone -Umeclidin-Vilant (TRELEGY ELLIPTA) 100-62.5-25 MCG/ACT AEPB Inhale 1 puff into the lungs daily.   glucose blood test strip Use to check blood sugars once daily   Lancets (ONETOUCH ULTRASOFT) lancets Use as  instructed   levalbuterol  (XOPENEX  HFA) 45 MCG/ACT inhaler Inhale 2 puffs into the lungs every 6 (six) hours as needed for wheezing or shortness of breath (cough).   losartan  (COZAAR ) 100 MG tablet Take 1 tablet (100 mg total) by mouth daily.   metFORMIN  (GLUCOPHAGE ) 500 MG tablet Take 1 tablet (500 mg total) by mouth 2 (two) times daily with a meal.   omeprazole  (PRILOSEC) 20 MG capsule Take 1 capsule (20 mg total) by mouth 2 (two) times daily.   sildenafil  (REVATIO ) 20 MG tablet TAKE 1 TABLET BY MOUTH THREE TIMES A DAY   Turmeric 400 MG CAPS Take 1 capsule by mouth daily.    Immunization History  Administered Date(s) Administered   Hep A / Hep B 09/10/2013, 10/05/2013, 04/08/2014   Pneumococcal Conjugate-13 06/03/2013   Pneumococcal Polysaccharide-23 01/30/2012, 04/25/2017   Tdap 01/22/2011, 09/21/2022        Objective:     BP 126/60   Pulse 88   Temp 97.7 F (36.5 C) (Temporal)   Ht 5' 10 (1.778 m)   Wt 206 lb 9.6 oz (93.7 kg)   SpO2 99%   BMI 29.64 kg/m   SpO2: 99 %  GENERAL: Well-developed, overweight gentleman, no acute distress.  No conversational dyspnea.  Fully ambulatory.  Very hard of hearing. HEAD: Normocephalic, atraumatic.  EYES: Pupils equal, round, reactive to light.  No scleral icterus.  EARS: Wears hearing aids, not wearing them today. MOUTH: Edentulous, wears dentures.  Pharynx clear.  No thrush. NECK: Supple. No thyromegaly. Trachea midline. No JVD.  No adenopathy. PULMONARY: Good air entry bilaterally.  Coarse, otherwise  no adventitious sounds. CARDIOVASCULAR: S1 and S2. Regular rate and rhythm.  Grade 3/6 aortic stenosis murmur. ABDOMEN: Benign. MUSCULOSKELETAL: No joint deformity, no clubbing, no edema.  NEUROLOGIC: No overt focal deficit, no gait disturbance, speech is fluent. SKIN: Intact,warm,dry. PSYCH: Mood and behavior normal.  Recent Results (from the past 2160 hours)  Basic metabolic panel with GFR     Status: Abnormal   Collection  Time: 10/18/23  1:04 PM  Result Value Ref Range   Sodium 137 135 - 145 mEq/L   Potassium 4.7 3.5 - 5.1 mEq/L   Chloride 99 96 - 112 mEq/L   CO2 29 19 - 32 mEq/L   Glucose, Bld 152 (H) 70 - 99 mg/dL   BUN 17 6 - 23 mg/dL   Creatinine, Ser 8.70 0.40 - 1.50 mg/dL   GFR 46.97 (L) >39.99 mL/min    Comment: Calculated using the CKD-EPI Creatinine Equation (2021)   Calcium  9.2 8.4 - 10.5 mg/dL  Hepatic function panel     Status: Abnormal   Collection Time: 10/18/23  1:04 PM  Result Value Ref Range   Total Bilirubin 1.3 (H) 0.2 - 1.2 mg/dL   Bilirubin, Direct 0.3 0.0 - 0.3 mg/dL   Alkaline Phosphatase 74 39 - 117 U/L  AST 13 0 - 37 U/L   ALT 12 0 - 53 U/L   Total Protein 6.8 6.0 - 8.3 g/dL   Albumin 4.3 3.5 - 5.2 g/dL  CBC with Differential/Platelet     Status: None   Collection Time: 10/18/23  1:04 PM  Result Value Ref Range   WBC 8.4 4.0 - 10.5 K/uL   RBC 4.62 4.22 - 5.81 Mil/uL   Hemoglobin 13.7 13.0 - 17.0 g/dL   HCT 60.3 60.9 - 47.9 %   MCV 85.9 78.0 - 100.0 fl   MCHC 34.6 30.0 - 36.0 g/dL   RDW 86.2 88.4 - 84.4 %   Platelets 309.0 150.0 - 400.0 K/uL   Neutrophils Relative % 75.5 43.0 - 77.0 %   Lymphocytes Relative 12.3 12.0 - 46.0 %   Monocytes Relative 8.1 3.0 - 12.0 %   Eosinophils Relative 3.3 0.0 - 5.0 %   Basophils Relative 0.8 0.0 - 3.0 %   Neutro Abs 6.3 1.4 - 7.7 K/uL   Lymphs Abs 1.0 0.7 - 4.0 K/uL   Monocytes Absolute 0.7 0.1 - 1.0 K/uL   Eosinophils Absolute 0.3 0.0 - 0.7 K/uL   Basophils Absolute 0.1 0.0 - 0.1 K/uL  Uric acid     Status: None   Collection Time: 10/18/23  1:04 PM  Result Value Ref Range   Uric Acid, Serum 4.0 4.0 - 7.8 mg/dL  AFP tumor marker     Status: None   Collection Time: 11/12/23 11:43 AM  Result Value Ref Range   AFP-Tumor Marker 1.8 <6.1 ng/mL    Comment: . This test was performed using the Beckman Coulter chemiluminescent method. Values obtained from different assay methods cannot be used interchangeably. AFP levels,  regardless of value, should not be interpreted as absolute evidence of the presence or absence of disease. SABRA   ECHOCARDIOGRAM COMPLETE     Status: None   Collection Time: 11/26/23  2:41 PM  Result Value Ref Range   AR max vel 0.93 cm2   AV Peak grad 66.6 mmHg   Ao pk vel 4.08 m/s   S' Lateral 2.40 cm   Area-P 1/2 3.27 cm2   AV Area VTI 1.02 cm2   AV Mean grad 38.0 mmHg   AV Area mean vel 0.92 cm2   MV VTI 2.08 cm2   Est EF 60 - 65%   Pulmonary function test     Status: None (Preliminary result)   Collection Time: 01/03/24  9:35 AM  Result Value Ref Range   FVC-Pre 3.23 L   FVC-%Pred-Pre 78 %   FVC-Post 3.60 L   FVC-%Pred-Post 87 %   FVC-%Change-Post 11 %   FEV1-Pre 2.12 L   FEV1-%Pred-Pre 72 %   FEV1-Post 2.15 L   FEV1-%Pred-Post 72 %   FEV1-%Change-Post 1 %   FEV6-Pre 3.20 L   FEV6-%Pred-Pre 83 %   FEV6-Post 3.51 L   FEV6-%Pred-Post 91 %   FEV6-%Change-Post 9 %   Pre FEV1/FVC ratio 66 %   FEV1FVC-%Pred-Pre 91 %   Post FEV1/FVC ratio 60 %   FEV1FVC-%Change-Post -8 %   Pre FEV6/FVC Ratio 99 %   FEV6FVC-%Pred-Pre 105 %   Post FEV6/FVC ratio 99 %   FEV6FVC-%Pred-Post 105 %   FEV6FVC-%Change-Post 0 %   FEF 25-75 Pre 1.26 L/sec   FEF2575-%Pred-Pre 60 %   FEF 25-75 Post 1.67 L/sec   FEF2575-%Pred-Post 80 %   FEF2575-%Change-Post 31 %   DLCO unc 21.28 ml/min/mmHg   DLCO unc %  pred 87 %   DL/VA 6.33 ml/min/mmHg/L   DL/VA % pred 94 %  *Discussed PFTs with patient today       Assessment & Plan:     ICD-10-CM   1. COPD, moderate (HCC)  J44.9     2. Persistent cough for 3 weeks or longer  R05.3     3. Lung nodule seen on imaging study  R91.1     4. Nonrheumatic aortic valve stenosis  I35.0     5. Pleural plaque due to asbestos exposure  J92.0       Meds ordered this encounter  Medications   Fluticasone -Umeclidin-Vilant (TRELEGY ELLIPTA) 100-62.5-25 MCG/ACT AEPB    Sig: Inhale 1 puff into the lungs daily.    Dispense:  60 each    Refill:  3    Discussion:    Chronic obstructive pulmonary disease (COPD) with cough COPD with associated cough, primarily nocturnal, possibly exacerbated by orthopnea due to aortic stenosis.  Needs also better control of COPD.  No reflux or heartburn reported. - Initiated daily inhaler therapy for COPD management (Trelegy 101 puff, once daily).  Aortic stenosis Aortic stenosis may be aggravating his symptoms. Loud murmur on examination. - Gets echocardiograms every 6 months.  - Continue follow-up with cardiology, may need TAVR in the future.  Very hard of hearing Difficult communication with the patient today due to patient not wearing his hearing aids.  Apparently patient needs replacement hearing aids. - Procure replacement hearing aids.     Advised if symptoms do not improve or worsen, to please contact office for sooner follow up or seek emergency care.    I spent 40 minutes of dedicated to the care of this patient on the date of this encounter to include pre-visit review of records, face-to-face time with the patient discussing conditions above, post visit ordering of testing, clinical documentation with the electronic health record, making appropriate referrals as documented, and communicating necessary findings to members of the patients care team.     C. Leita Sanders, MD Advanced Bronchoscopy PCCM [facility]    *This note was generated using voice recognition software/Dragon and/or AI transcription program.  Despite best efforts to proofread, errors can occur which can change the meaning. Any transcriptional errors that result from this process are unintentional and may not be fully corrected at the time of dictation.

## 2024-01-03 NOTE — Patient Instructions (Signed)
 History of Present Illness Chad Gill is a 78 year old male with COPD and aortic stenosis who presents with a cough.  He experiences a cough that is more pronounced at night when lying down, although it is present during the day as well. No reflux or heartburn. He has a history of COPD and experiences shortness of breath. He uses an inhaler as needed for shortness of breath.  He has a history of asbestos exposure and a ground glass lung nodule.  He also has aortic stenosis.  Assessment and Plan Chronic obstructive pulmonary disease (COPD) with cough COPD with associated cough, primarily nocturnal, possibly exacerbated by fluid accumulation due to aortic stenosis. No reflux or heartburn reported. - Initiated daily inhaler therapy for COPD management.  Aortic stenosis Narrowing of the aortic valve, contributing to nocturnal cough due to fluid accumulation. Loud murmur on examination. - Elevate the head of the bed to reduce nocturnal cough.

## 2024-01-07 ENCOUNTER — Ambulatory Visit: Payer: Self-pay | Admitting: Pulmonary Disease

## 2024-01-29 ENCOUNTER — Other Ambulatory Visit: Payer: Self-pay | Admitting: Internal Medicine

## 2024-02-07 ENCOUNTER — Other Ambulatory Visit: Payer: Self-pay | Admitting: Internal Medicine

## 2024-03-05 ENCOUNTER — Encounter: Payer: Self-pay | Admitting: Pulmonary Disease

## 2024-03-05 ENCOUNTER — Ambulatory Visit: Admitting: Pulmonary Disease

## 2024-03-05 ENCOUNTER — Ambulatory Visit: Payer: Self-pay | Admitting: Pulmonary Disease

## 2024-03-05 ENCOUNTER — Other Ambulatory Visit
Admission: RE | Admit: 2024-03-05 | Discharge: 2024-03-05 | Disposition: A | Discharge status: Home / Self Care | Attending: Pulmonary Disease | Admitting: Pulmonary Disease

## 2024-03-05 VITALS — BP 140/86 | HR 74 | Ht 70.0 in | Wt 208.4 lb

## 2024-03-05 DIAGNOSIS — Z87891 Personal history of nicotine dependence: Secondary | ICD-10-CM | POA: Diagnosis not present

## 2024-03-05 DIAGNOSIS — R053 Chronic cough: Secondary | ICD-10-CM

## 2024-03-05 DIAGNOSIS — R21 Rash and other nonspecific skin eruption: Secondary | ICD-10-CM | POA: Insufficient documentation

## 2024-03-05 DIAGNOSIS — I350 Nonrheumatic aortic (valve) stenosis: Secondary | ICD-10-CM

## 2024-03-05 DIAGNOSIS — J449 Chronic obstructive pulmonary disease, unspecified: Secondary | ICD-10-CM | POA: Diagnosis not present

## 2024-03-05 DIAGNOSIS — R059 Cough, unspecified: Secondary | ICD-10-CM

## 2024-03-05 LAB — CBC WITH DIFFERENTIAL/PLATELET
Abs Immature Granulocytes: 0.04 K/uL (ref 0.00–0.07)
Basophils Absolute: 0.1 K/uL (ref 0.0–0.1)
Basophils Relative: 1 %
Eosinophils Absolute: 0.7 K/uL — ABNORMAL HIGH (ref 0.0–0.5)
Eosinophils Relative: 7 %
HCT: 38.7 % — ABNORMAL LOW (ref 39.0–52.0)
Hemoglobin: 13.5 g/dL (ref 13.0–17.0)
Immature Granulocytes: 0 %
Lymphocytes Relative: 12 %
Lymphs Abs: 1.1 K/uL (ref 0.7–4.0)
MCH: 30 pg (ref 26.0–34.0)
MCHC: 34.9 g/dL (ref 30.0–36.0)
MCV: 86 fL (ref 80.0–100.0)
Monocytes Absolute: 0.7 K/uL (ref 0.1–1.0)
Monocytes Relative: 7 %
Neutro Abs: 6.5 K/uL (ref 1.7–7.7)
Neutrophils Relative %: 73 %
Platelets: 261 K/uL (ref 150–400)
RBC: 4.5 MIL/uL (ref 4.22–5.81)
RDW: 13.1 % (ref 11.5–15.5)
WBC: 9 K/uL (ref 4.0–10.5)
nRBC: 0 % (ref 0.0–0.2)

## 2024-03-05 MED ORDER — BUDESONIDE-FORMOTEROL FUMARATE 160-4.5 MCG/ACT IN AERO: 2.0000 | INHALATION_SPRAY | Freq: Two times a day (BID) | RESPIRATORY_TRACT | 11 refills | Status: AC

## 2024-03-05 NOTE — Progress Notes (Signed)
 "  Subjective:    Patient ID: Chad Gill, male    DOB: 06-May-1945, 79 y.o.   MRN: [id number]  Patient Care Team: [name], MD as PCP - General (Internal Medicine) Pa, [facility])  Chief Complaint  Patient presents with   Follow-up    BACKGROUND/INTERVAL:Patient is a 79 year old remote former smoker with a history as noted below, who presents for follow-up of COPD and cough.  HPI Discussed the use of AI scribe software for clinical note transcription with the patient, who gave verbal consent to proceed.  History of Present Illness   Chad Gill is a 79 year old male with COPD who presents with a persistent rash and for follow-up on COPD.  He is accompanied by his wife Chad.  He has a persistent rash that began around Christmas time, described as 'little bumps' located on his buttocks, legs, and a bit on his back. The rash is itchy and has not improved since discontinuing Trelegy Ellipta , which he believes caused the rash.  His cough is 'pretty good' but occurs at night and occasionally during the day. He experiences shortness of breath that is 'pretty good' but notes some wheezing at times. He is aware of his aortic valve narrowing this is being monitored by cardiology with echocardiograms every 6 months.  He is currently not on any new medications. No other new medications have been taken.     DATA 01/03/2024 PFTs: Limited to spirometry pre and post as patient could not hear when placed in the body box for low lung volumes. FEV1 of 2.12 L or 72% predicted, FVC of 3.23 L or 78% predicted, FEV1/FVC of 66%, there was no significant bronchodilator response. Diffusion capacity was normal. Consistent with moderate obstructive defect.   Review of Systems A 10 point review of systems was performed and it is as noted above otherwise negative.   Patient Active Problem List   Diagnosis Date Noted   Diabetes mellitus treated with injections of  non-insulin medication (HCC) 11/14/2023   Pedal edema 10/18/2023   Foot pain, bilateral 10/18/2023   Coronary artery disease due to calcified coronary lesion 09/01/2023   Cerebrovascular disease, unspecified 11/06/2022   Erectile dysfunction 11/29/2021   Aortic valve stenosis 04/02/2021   Advice given about COVID-19 virus by telephone 03/09/2019   Persistent cough for 3 weeks or longer 01/16/2016   Hyperlipidemia associated with type 2 diabetes mellitus (HCC) 04/16/2015   Liver cirrhosis secondary to NASH (HCC) 01/17/2015   Medicare annual wellness visit, subsequent 10/12/2014   Uses hearing aid 09/10/2013   Tinnitus of both ears 06/03/2013   Obesity, diabetes, and hypertension syndrome (HCC) 08/26/2012   Hypertension 01/30/2012   GERD (gastroesophageal reflux disease)    Adenomatous polyps     Social History   Tobacco Use   Smoking status: Former    Current packs/day: 0.00    Types: Cigarettes    Quit date: 01/21/1986    Years since quitting: 38.1   Smokeless tobacco: Never  Substance Use Topics   Alcohol use: Yes    Comment: ocassional beer    Allergies[1]  Active Medications[2]  Immunization History  Administered Date(s) Administered   Hep A / Hep B 09/10/2013, 10/05/2013, 04/08/2014   Pneumococcal Conjugate-13 06/03/2013   Pneumococcal Polysaccharide-23 01/30/2012, 04/25/2017   Tdap 01/22/2011, 09/21/2022      Objective:     Vitals:   03/05/24 1057  BP: (!) 140/86  Pulse: 74  Height: 5' 10 (1.778  m)  Weight: 208 lb 6 oz (94.5 kg)  SpO2: 96%  BMI (Calculated): 29.9     SpO2: 96 %  GENERAL: Well-developed, overweight gentleman, no acute distress.  No conversational dyspnea.  Fully ambulatory.  Very hard of hearing. HEAD: Normocephalic, atraumatic.  EYES: Pupils equal, round, reactive to light.  No scleral icterus.  EARS: Wears hearing aids, not wearing them today. MOUTH: Edentulous, wears dentures.  Pharynx clear.  No thrush. NECK: Supple. No  thyromegaly. Trachea midline. No JVD.  No adenopathy. PULMONARY: Good air entry bilaterally.  Coarse, otherwise  no adventitious sounds. CARDIOVASCULAR: S1 and S2. Regular rate and rhythm.  Grade 3/6 aortic stenosis murmur. ABDOMEN: Benign. MUSCULOSKELETAL: No joint deformity, no clubbing, no edema.  NEUROLOGIC: No overt focal deficit, no gait disturbance, speech is fluent. SKIN: Intact,warm,dry.  Papular rash noted on both arms. PSYCH: Mood and behavior normal.    Assessment & Plan:     ICD-10-CM   1. COPD, moderate (HCC)  J44.9     2. Persistent cough for 3 weeks or longer  R05.3     3. Nonrheumatic aortic valve stenosis  I35.0     4. Rash and nonspecific skin eruption  R21 CBC with Differential/Platelet      Orders Placed This Encounter  Procedures   CBC with Differential/Platelet    Standing Status:   Future    Number of Occurrences:   1    Expected Date:   03/05/2024    Expiration Date:   03/05/2025    Meds ordered this encounter  Medications   budesonide -formoterol  (SYMBICORT ) 160-4.5 MCG/ACT inhaler    Sig: Inhale 2 puffs into the lungs 2 (two) times daily.    Dispense:  10.2 g    Refill:  11    Discussion:    Chronic obstructive pulmonary disease COPD with intermittent cough, primarily nocturnal and occasionally diurnal. Shortness of breath is well-controlled. Previous use of Trelegy Ellipta  resulted in a rash, which persisted after discontinuation, suggesting it may not be related to the medication. Aortic valve narrowing may contribute to symptoms. - Prescribed Symbicort  160/4.5 as maintenance inhaler. - Instructed on proper inhaler use and emphasized rinsing mouth after use. - Ordered blood work to check for eosinophils. - Scheduled follow-up in three months.  Rash Persistent rash since Christmas, initially suspected to be related to Trelegy Ellipta , but unlikely due to persistence after discontinuation. Rash is located on buttocks, legs, and back, with  itching. Differential diagnosis includes other dermatological conditions. - Referred to dermatologist for evaluation of rash. - Advised to contact dermatologist for an appointment.     Follow-up in 3 months time.  Advised if symptoms do not improve or worsen, to please contact office for sooner follow up or seek emergency care.    I spent 34 minutes of dedicated to the care of this patient on the date of this encounter to include pre-visit review of records, face-to-face time with the patient discussing conditions above, post visit ordering of testing, clinical documentation with the electronic health record, making appropriate referrals as documented, and communicating necessary findings to members of the patients care team.     C. Leita Sanders, MD Advanced Bronchoscopy PCCM [facility]    *This note was generated using voice recognition software/Dragon and/or AI transcription program.  Despite best efforts to proofread, errors can occur which can change the meaning. Any transcriptional errors that result from this process are unintentional and may not be fully corrected at the time of dictation.     [  1]  Allergies Allergen Reactions   Other Hives    Flu vaccine   Trelegy Ellipta  [Fluticasone -Umeclidin-Vilant] Rash   Influenza Vac Split [Influenza Virus Vaccine] Hives  [2]  Current Meds  Medication Sig   Ascorbic Acid (VITAMIN C PO) Take 500 mg by mouth.   aspirin  EC 81 MG tablet Take 1 tablet (81 mg total) by mouth daily. Swallow whole.   atorvastatin  (LIPITOR) 20 MG tablet TAKE 1 TABLET BY MOUTH EVERY DAY   budesonide -formoterol  (SYMBICORT ) 160-4.5 MCG/ACT inhaler Inhale 2 puffs into the lungs 2 (two) times daily.   chlorthalidone  (HYGROTON ) 25 MG tablet TAKE 0.5 TABLETS (12.5 MG TOTAL) BY MOUTH DAILY. IN AM   fluticasone  (FLONASE ) 50 MCG/ACT nasal spray SPRAY 2 SPRAYS INTO EACH NOSTRIL EVERY DAY   glucose blood test strip Use to check blood sugars once  daily   levalbuterol  (XOPENEX  HFA) 45 MCG/ACT inhaler Inhale 2 puffs into the lungs every 6 (six) hours as needed for wheezing or shortness of breath (cough).   losartan  (COZAAR ) 100 MG tablet Take 1 tablet (100 mg total) by mouth daily.   metFORMIN  (GLUCOPHAGE ) 500 MG tablet Take 1 tablet (500 mg total) by mouth 2 (two) times daily with a meal.   omeprazole  (PRILOSEC) 20 MG capsule TAKE 1 CAPSULE BY MOUTH TWICE A DAY   sildenafil  (REVATIO ) 20 MG tablet TAKE 1 TABLET BY MOUTH THREE TIMES A DAY   Turmeric 400 MG CAPS Take 1 capsule by mouth daily.   "

## 2024-03-05 NOTE — Patient Instructions (Signed)
 VISIT SUMMARY:  Today, we discussed your persistent rash and followed up on your COPD. You mentioned that the rash started around Christmas and has not improved since stopping Trelegy Ellipta . Your cough and shortness of breath are manageable, but you experience some symptoms at night and occasionally during the day. We also reviewed your awareness of your aortic valve narrowing.  YOUR PLAN:  -CHRONIC OBSTRUCTIVE PULMONARY DISEASE (COPD): COPD is a chronic lung condition that makes it hard to breathe. Your cough and shortness of breath are mostly under control, but you have some symptoms at night and occasionally during the day. We prescribed a new inhaler with two medications to be used two puffs in the morning and two puffs in the evening. Please make sure to rinse your mouth after using the inhaler. We also ordered blood work to check for allergy cells and scheduled a follow-up appointment in three months.  -RASH: You have a persistent rash on your buttocks, legs, and back that started around Christmas. It is itchy and has not improved since stopping Trelegy Ellipta , so it is unlikely to be related to the medication. We referred you to a dermatologist for further evaluation. Please contact the dermatologist to schedule an appointment.  INSTRUCTIONS:  Please follow up with the dermatologist for your rash evaluation. We will see you again in three months for a follow-up on your COPD. Make sure to get your blood work done as ordered.

## 2024-05-11 ENCOUNTER — Ambulatory Visit: Admitting: Internal Medicine

## 2024-06-04 ENCOUNTER — Ambulatory Visit: Admitting: Pulmonary Disease

## 2024-08-24 ENCOUNTER — Ambulatory Visit
